# Patient Record
Sex: Female | Born: 1980 | Race: White | Hispanic: No | Marital: Married | Smoking: Never smoker
Health system: Southern US, Community
[De-identification: ages and names within clinical notes are randomized; demographics above are authoritative.]

## PROBLEM LIST (undated history)

## (undated) ENCOUNTER — Inpatient Hospital Stay: Payer: Self-pay

## (undated) DIAGNOSIS — D649 Anemia, unspecified: Secondary | ICD-10-CM

## (undated) DIAGNOSIS — F32A Depression, unspecified: Secondary | ICD-10-CM

## (undated) DIAGNOSIS — E039 Hypothyroidism, unspecified: Secondary | ICD-10-CM

## (undated) DIAGNOSIS — B009 Herpesviral infection, unspecified: Secondary | ICD-10-CM

## (undated) DIAGNOSIS — J189 Pneumonia, unspecified organism: Secondary | ICD-10-CM

## (undated) DIAGNOSIS — F419 Anxiety disorder, unspecified: Secondary | ICD-10-CM

## (undated) DIAGNOSIS — F329 Major depressive disorder, single episode, unspecified: Secondary | ICD-10-CM

## (undated) DIAGNOSIS — R87610 Atypical squamous cells of undetermined significance on cytologic smear of cervix (ASC-US): Secondary | ICD-10-CM

## (undated) DIAGNOSIS — G4730 Sleep apnea, unspecified: Secondary | ICD-10-CM

## (undated) DIAGNOSIS — R0902 Hypoxemia: Secondary | ICD-10-CM

## (undated) DIAGNOSIS — N83209 Unspecified ovarian cyst, unspecified side: Secondary | ICD-10-CM

## (undated) HISTORY — DX: Sleep apnea, unspecified: G47.30

## (undated) HISTORY — PX: WISDOM TOOTH EXTRACTION: SHX21

## (undated) HISTORY — PX: TONSILLECTOMY AND ADENOIDECTOMY: SUR1326

## (undated) HISTORY — DX: Unspecified ovarian cyst, unspecified side: N83.209

## (undated) HISTORY — DX: Atypical squamous cells of undetermined significance on cytologic smear of cervix (ASC-US): R87.610

## (undated) HISTORY — DX: Anxiety disorder, unspecified: F41.9

---

## 2004-06-25 ENCOUNTER — Emergency Department (HOSPITAL_COMMUNITY): Admission: EM | Admit: 2004-06-25 | Discharge: 2004-06-25 | Payer: Self-pay | Admitting: Emergency Medicine

## 2004-08-05 ENCOUNTER — Emergency Department (HOSPITAL_COMMUNITY): Admission: EM | Admit: 2004-08-05 | Discharge: 2004-08-05 | Payer: Self-pay | Admitting: Emergency Medicine

## 2007-04-13 ENCOUNTER — Emergency Department (HOSPITAL_COMMUNITY): Admission: EM | Admit: 2007-04-13 | Discharge: 2007-04-13 | Payer: Self-pay | Admitting: Emergency Medicine

## 2008-09-07 ENCOUNTER — Emergency Department (HOSPITAL_COMMUNITY): Admission: EM | Admit: 2008-09-07 | Discharge: 2008-09-07 | Payer: Self-pay | Admitting: Emergency Medicine

## 2010-08-06 LAB — POCT I-STAT, CHEM 8
BUN: 9 mg/dL (ref 6–23)
Calcium, Ion: 1.19 mmol/L (ref 1.12–1.32)
Hemoglobin: 14.3 g/dL (ref 12.0–15.0)
Sodium: 139 mEq/L (ref 135–145)
TCO2: 28 mmol/L (ref 0–100)

## 2010-08-06 LAB — POCT PREGNANCY, URINE: Preg Test, Ur: NEGATIVE

## 2011-06-03 ENCOUNTER — Ambulatory Visit: Payer: Self-pay | Admitting: Unknown Physician Specialty

## 2011-06-11 LAB — PATHOLOGY REPORT

## 2011-11-03 ENCOUNTER — Emergency Department: Payer: Self-pay | Admitting: *Deleted

## 2011-11-03 LAB — CBC
HCT: 40.8 % (ref 35.0–47.0)
HGB: 13.4 g/dL (ref 12.0–16.0)
MCH: 30.2 pg (ref 26.0–34.0)
MCHC: 32.8 g/dL (ref 32.0–36.0)
MCV: 92 fL (ref 80–100)
Platelet: 333 10*3/uL (ref 150–440)
RBC: 4.43 10*6/uL (ref 3.80–5.20)

## 2011-11-03 LAB — COMPREHENSIVE METABOLIC PANEL
Alkaline Phosphatase: 76 U/L (ref 50–136)
Calcium, Total: 8.8 mg/dL (ref 8.5–10.1)
Co2: 25 mmol/L (ref 21–32)
EGFR (African American): >60
EGFR (Non-African Amer.): >60
Potassium: 3.8 mmol/L (ref 3.5–5.1)
SGOT(AST): 56 U/L — ABNORMAL HIGH (ref 15–37)
SGPT (ALT): 68 U/L
Sodium: 141 mmol/L (ref 136–145)

## 2011-11-03 LAB — PREGNANCY, URINE: Pregnancy Test, Urine: NEGATIVE m[IU]/mL

## 2011-11-03 LAB — URINALYSIS, COMPLETE
Leukocyte Esterase: NEGATIVE
Nitrite: NEGATIVE
Ph: 6 (ref 4.5–8.0)
Protein: NEGATIVE
RBC,UR: NONE SEEN /HPF (ref 0–5)

## 2011-11-03 LAB — LIPASE, BLOOD: Lipase: 250 U/L (ref 73–393)

## 2012-05-09 ENCOUNTER — Emergency Department: Payer: Self-pay | Admitting: Emergency Medicine

## 2012-05-09 LAB — URINALYSIS, COMPLETE
Bilirubin,UR: NEGATIVE
Ketone: NEGATIVE
Leukocyte Esterase: NEGATIVE
Nitrite: NEGATIVE
Protein: NEGATIVE
RBC,UR: 7 /HPF (ref 0–5)

## 2012-05-09 LAB — COMPREHENSIVE METABOLIC PANEL
BUN: 9 mg/dL (ref 7–18)
Calcium, Total: 9.2 mg/dL (ref 8.5–10.1)
Chloride: 103 mmol/L (ref 98–107)
Creatinine: 0.7 mg/dL (ref 0.60–1.30)
Glucose: 105 mg/dL — ABNORMAL HIGH (ref 65–99)
Osmolality: 271 (ref 275–301)
SGOT(AST): 35 U/L (ref 15–37)

## 2012-05-09 LAB — CBC: HGB: 14.3 g/dL (ref 12.0–16.0)

## 2012-05-09 LAB — LIPASE, BLOOD: Lipase: 176 U/L (ref 73–393)

## 2012-07-03 ENCOUNTER — Emergency Department: Payer: Self-pay | Admitting: Emergency Medicine

## 2014-08-20 NOTE — Op Note (Signed)
PATIENT NAME:  Jenny Hendrix, Jenny Hendrix MR#:  [id number] DATE OF BIRTH:  [birth year]  DATE OF PROCEDURE:  06/03/2011  PREOPERATIVE DIAGNOSES:  1. Chronic adenotonsillitis. 2. Obstructive sleep apnea.   POSTOPERATIVE DIAGNOSES:  1. Chronic adenotonsillitis. 2. Obstructive sleep apnea.   PROCEDURE PERFORMED: Tonsillectomy and adenoidectomy.   SURGEON: Roena Malady, MD  ANESTHESIA: General endotracheal.   DESCRIPTION OF THE PROCEDURE:  [name] was identified in the holding area and taken to the operating room and placed in the supine position.  After general endotracheal anesthesia, the table was turned 45 degrees and the patient was draped in the usual fashion for a tonsillectomy.  A mouth gag was inserted into the oral cavity and examination of the oropharynx showed the uvula was non-bifid.  There was no evidence of submucous cleft to the palate.  There were large tonsils.  A red rubber catheter was placed through the nostril.  Examination of the nasopharynx showed large obstructing adenoids.  Under indirect vision with the mirror, an adenotome was placed in the nasopharynx.  The adenoids were curetted free.  Reinspection with a mirror showed excellent removal of the adenoid.  Nasopharyngeal packs were then placed.  The operation then turned to the tonsillectomy.  Beginning on the left-hand side a tenaculum was used to grasp the tonsil and the Bovie cautery was used to dissect it free from the fossa.  In a similar fashion, the right tonsil was removed.  Meticulous hemostasis was achieved using the Bovie cautery.  With both tonsils removed and no active bleeding, the nasopharyngeal packs were removed.  Suction cautery was then used to cauterize the nasopharyngeal bed to prevent bleeding.  The red rubber catheter was removed with no active bleeding.  0.5% plain Marcaine was used to inject the anterior and posterior tonsillar pillars bilaterally.  A total of 8 mL was used.  The patient tolerated the  procedure well and was awakened in the operating room and taken to the recovery room in stable condition.   CULTURES:  None.  SPECIMENS:  Tonsils and adenoids.  ESTIMATED BLOOD LOSS:  Less than 30 mL.   ____________________________ Roena Malady, MD ctm:cms D: 06/03/2011 09:33:24 ET T: 06/03/2011 10:16:54 ET JOB#: [id number]  cc: Roena Malady, MD, <Dictator> Roena Malady MD ELECTRONICALLY SIGNED 06/10/2011 12:46

## 2014-08-21 ENCOUNTER — Emergency Department
Admit: 2014-08-21 | Disposition: A | Discharge status: Home / Self Care | Payer: Self-pay | Admitting: Emergency Medicine

## 2014-08-21 LAB — COMPREHENSIVE METABOLIC PANEL
ANION GAP: 9 (ref 7–16)
Albumin: 4.5 g/dL
Alkaline Phosphatase: 48 U/L
BILIRUBIN TOTAL: 0.6 mg/dL
BUN: 14 mg/dL
CALCIUM: 9.5 mg/dL
CO2: 27 mmol/L
Chloride: 101 mmol/L
Creatinine: 0.84 mg/dL
EGFR (Non-African Amer.): >60
GLUCOSE: 111 mg/dL — AB
Potassium: 3.5 mmol/L
SGOT(AST): 30 U/L
SGPT (ALT): 21 U/L
Sodium: 137 mmol/L
Total Protein: 8.3 g/dL — ABNORMAL HIGH

## 2014-08-21 LAB — CBC
HCT: 41 % (ref 35.0–47.0)
HGB: 13.6 g/dL (ref 12.0–16.0)
MCH: 28.9 pg (ref 26.0–34.0)
MCHC: 33.2 g/dL (ref 32.0–36.0)
MCV: 87 fL (ref 80–100)
Platelet: 348 10*3/uL (ref 150–440)
RBC: 4.72 10*6/uL (ref 3.80–5.20)
RDW: 12.8 % (ref 11.5–14.5)
WBC: 7.8 10*3/uL (ref 3.6–11.0)

## 2014-08-21 LAB — TROPONIN I: Troponin-I: <0.03 ng/mL

## 2014-12-01 ENCOUNTER — Other Ambulatory Visit: Payer: Self-pay | Admitting: Obstetrics and Gynecology

## 2014-12-01 DIAGNOSIS — N979 Female infertility, unspecified: Secondary | ICD-10-CM

## 2014-12-06 ENCOUNTER — Ambulatory Visit: Payer: Self-pay

## 2015-01-04 ENCOUNTER — Encounter: Payer: Self-pay | Admitting: Obstetrics and Gynecology

## 2015-01-04 ENCOUNTER — Ambulatory Visit
Admission: RE | Admit: 2015-01-04 | Discharge: 2015-01-04 | Disposition: A | Discharge status: Home / Self Care | Payer: BLUE CROSS/BLUE SHIELD | Source: Ambulatory Visit | Attending: Obstetrics and Gynecology | Admitting: Obstetrics and Gynecology

## 2015-01-04 DIAGNOSIS — N979 Female infertility, unspecified: Secondary | ICD-10-CM | POA: Insufficient documentation

## 2015-01-04 NOTE — Progress Notes (Signed)
Patient ID: Jenny Hendrix, female   DOB: 22-Dec-1980, 34 y.o.   MRN: [id number] @PATIENTID @[id number]   1982/01/32 y.o. 01/04/2015 Benjaman Kindler, MD  Hysterosalpingogram Procedure Note  Date of procedure: 01/04/2015   Pre-operative Diagnosis: Infertility  Post-operative Diagnosis: same  Procedure: Hysterosalpingogram  Surgeon: Angelina Pih, MD  Assistant(s):  Radiology assistant. The radiologist present for today read the imaging and agreed with findings below.  Anesthesia: None  Estimated Blood Loss:  None         Complications:  None; patient tolerated the procedure well.         Disposition: To home         Condition: stable  Findings: Bilateral fill and spill of the tubes and a normal endometrial contour was noted.  Procedure Details  HSG procedure discussed with the patient.  Risks, complications, alternatives have been reviewed with her and she agrees to proceed.   The patient presented to the radiology lab and was identified as the correct patient and the procedure verified as an HSG. A verbal Time Out was held with all team members present and in agreement.  Speculum was inserted in to the vagina and the cervix visualized.  The cervix was cleaned with betadine solution. The HSG catheter was inserted and the balloon insufflated with approximately 1.5 ml of air.  Patient was then repositioned for fluoroscopy.  A total of 6 ml of contrast was used for the procedure. The patient tolerated the procedure well, no complications.   Bilateral fill and spill of the tubes and a normal endometrial contour was noted.  Results were reviewed with the patient at the time of the procedure. She verbalized understanding.   Benjaman Kindler, MD 01/04/2015

## 2015-02-16 ENCOUNTER — Ambulatory Visit
Admission: RE | Admit: 2015-02-16 | Payer: BLUE CROSS/BLUE SHIELD | Source: Ambulatory Visit | Admitting: Gastroenterology

## 2015-02-16 ENCOUNTER — Encounter: Admission: RE | Payer: Self-pay | Source: Ambulatory Visit

## 2015-02-16 SURGERY — ESOPHAGOGASTRODUODENOSCOPY (EGD) WITH PROPOFOL
Anesthesia: General

## 2016-05-22 DIAGNOSIS — R87610 Atypical squamous cells of undetermined significance on cytologic smear of cervix (ASC-US): Secondary | ICD-10-CM

## 2016-05-22 HISTORY — DX: Atypical squamous cells of undetermined significance on cytologic smear of cervix (ASC-US): R87.610

## 2016-05-22 LAB — OB RESULTS CONSOLE HGB/HCT, BLOOD
HEMATOCRIT: 40 %
HEMOGLOBIN: 13.2 g/dL

## 2016-05-22 LAB — OB RESULTS CONSOLE HIV ANTIBODY (ROUTINE TESTING): HIV: NONREACTIVE

## 2016-05-22 LAB — OB RESULTS CONSOLE RUBELLA ANTIBODY, IGM: RUBELLA: IMMUNE

## 2016-05-22 LAB — OB RESULTS CONSOLE GC/CHLAMYDIA
Chlamydia: NEGATIVE
GC PROBE AMP, GENITAL: NEGATIVE

## 2016-05-22 LAB — OB RESULTS CONSOLE HEPATITIS B SURFACE ANTIGEN: HEP B S AG: NEGATIVE

## 2016-05-22 LAB — OB RESULTS CONSOLE PLATELET COUNT: PLATELETS: 355 10*3/uL

## 2016-05-22 LAB — OB RESULTS CONSOLE VARICELLA ZOSTER ANTIBODY, IGG: Varicella: IMMUNE

## 2016-05-22 LAB — OB RESULTS CONSOLE RPR: RPR: NONREACTIVE

## 2016-05-22 LAB — OB RESULTS CONSOLE ANTIBODY SCREEN: ANTIBODY SCREEN: NEGATIVE

## 2016-06-19 ENCOUNTER — Emergency Department: Payer: BLUE CROSS/BLUE SHIELD

## 2016-06-19 ENCOUNTER — Encounter
Admission: EM | Disposition: A | Discharge status: Home / Self Care | Payer: Self-pay | Source: Home / Self Care | Attending: Emergency Medicine

## 2016-06-19 ENCOUNTER — Emergency Department
Admission: EM | Admit: 2016-06-19 | Discharge: 2016-06-19 | Disposition: A | Discharge status: Home / Self Care | Payer: BLUE CROSS/BLUE SHIELD | Attending: Emergency Medicine | Admitting: Emergency Medicine

## 2016-06-19 ENCOUNTER — Emergency Department: Payer: BLUE CROSS/BLUE SHIELD | Admitting: Registered Nurse

## 2016-06-19 DIAGNOSIS — O034 Incomplete spontaneous abortion without complication: Secondary | ICD-10-CM | POA: Diagnosis not present

## 2016-06-19 DIAGNOSIS — O039 Complete or unspecified spontaneous abortion without complication: Secondary | ICD-10-CM

## 2016-06-19 DIAGNOSIS — O4691 Antepartum hemorrhage, unspecified, first trimester: Secondary | ICD-10-CM | POA: Diagnosis present

## 2016-06-19 HISTORY — PX: DILATION AND EVACUATION: SHX1459

## 2016-06-19 LAB — CBC WITH DIFFERENTIAL/PLATELET
BASOS ABS: 0 10*3/uL (ref 0–0.1)
BASOS PCT: 0 %
EOS PCT: 4 %
Eosinophils Absolute: 0.3 10*3/uL (ref 0–0.7)
HEMATOCRIT: 38.1 % (ref 35.0–47.0)
Hemoglobin: 13.2 g/dL (ref 12.0–16.0)
LYMPHS PCT: 21 %
Lymphs Abs: 1.7 10*3/uL (ref 1.0–3.6)
MCH: 29.7 pg (ref 26.0–34.0)
MCHC: 34.6 g/dL (ref 32.0–36.0)
MCV: 85.9 fL (ref 80.0–100.0)
Monocytes Absolute: 0.7 10*3/uL (ref 0.2–0.9)
Monocytes Relative: 8 %
NEUTROS ABS: 5.4 10*3/uL (ref 1.4–6.5)
Neutrophils Relative %: 67 %
PLATELETS: 342 10*3/uL (ref 150–440)
RBC: 4.43 MIL/uL (ref 3.80–5.20)
RDW: 13.1 % (ref 11.5–14.5)
WBC: 8.1 10*3/uL (ref 3.6–11.0)

## 2016-06-19 LAB — HCG, QUANTITATIVE, PREGNANCY: hCG, Beta Chain, Quant, S: 5975 m[IU]/mL — ABNORMAL HIGH (ref <5)

## 2016-06-19 LAB — ABO/RH: ABO/RH(D): A POS

## 2016-06-19 SURGERY — DILATION AND EVACUATION, UTERUS
Anesthesia: General

## 2016-06-19 MED ORDER — KETOROLAC TROMETHAMINE 30 MG/ML IJ SOLN: 30.0000 mg | Freq: Four times a day (QID) | INTRAMUSCULAR | Status: DC

## 2016-06-19 MED ORDER — PROPOFOL 10 MG/ML IV BOLUS
INTRAVENOUS | Status: AC
  Filled 2016-06-19: qty 20

## 2016-06-19 MED ORDER — GLYCOPYRROLATE 0.2 MG/ML IJ SOLN
INTRAMUSCULAR | Status: DC | PRN
  Administered 2016-06-19: 0.2 mg via INTRAVENOUS

## 2016-06-19 MED ORDER — METHYLERGONOVINE MALEATE 0.2 MG PO TABS: 0.2000 mg | ORAL_TABLET | Freq: Four times a day (QID) | ORAL | 0 refills | Status: DC

## 2016-06-19 MED ORDER — ACETAMINOPHEN 325 MG PO TABS: 650.0000 mg | ORAL_TABLET | ORAL | Status: DC | PRN

## 2016-06-19 MED ORDER — MIDAZOLAM HCL 2 MG/2ML IJ SOLN
INTRAMUSCULAR | Status: DC | PRN
  Administered 2016-06-19: 2 mg via INTRAVENOUS

## 2016-06-19 MED ORDER — SODIUM CHLORIDE 0.9 % IV BOLUS (SEPSIS)
1000.0000 mL | Freq: Once | INTRAVENOUS | Status: AC
  Administered 2016-06-19: 1000 mL via INTRAVENOUS

## 2016-06-19 MED ORDER — MORPHINE SULFATE (PF) 4 MG/ML IV SOLN: 1.0000 mg | INTRAVENOUS | Status: DC | PRN

## 2016-06-19 MED ORDER — ONDANSETRON HCL 4 MG/2ML IJ SOLN
INTRAMUSCULAR | Status: AC
  Filled 2016-06-19: qty 2

## 2016-06-19 MED ORDER — ACETAMINOPHEN 500 MG PO TABS
1000.0000 mg | ORAL_TABLET | Freq: Once | ORAL | Status: AC
Start: 2016-06-19 — End: 2016-06-19
  Administered 2016-06-19: 1000 mg via ORAL
  Filled 2016-06-19: qty 2

## 2016-06-19 MED ORDER — DOXYCYCLINE HYCLATE 100 MG PO CAPS: 100.0000 mg | ORAL_CAPSULE | Freq: Two times a day (BID) | ORAL | 0 refills | Status: DC

## 2016-06-19 MED ORDER — ACETAMINOPHEN NICU IV SYRINGE 10 MG/ML
INTRAVENOUS | Status: AC
  Filled 2016-06-19: qty 1

## 2016-06-19 MED ORDER — IBUPROFEN 400 MG PO TABS: 400.0000 mg | ORAL_TABLET | Freq: Four times a day (QID) | ORAL | 0 refills | Status: DC | PRN

## 2016-06-19 MED ORDER — LACTATED RINGERS IV SOLN: INTRAVENOUS | Status: DC

## 2016-06-19 MED ORDER — PROMETHAZINE HCL 25 MG/ML IJ SOLN
6.2500 mg | INTRAMUSCULAR | Status: DC | PRN
  Administered 2016-06-19: 6.25 mg via INTRAVENOUS

## 2016-06-19 MED ORDER — FENTANYL CITRATE (PF) 100 MCG/2ML IJ SOLN
25.0000 ug | INTRAMUSCULAR | Status: DC | PRN
  Administered 2016-06-19 (×3): 25 ug via INTRAVENOUS

## 2016-06-19 MED ORDER — SODIUM CHLORIDE 0.9 % IJ SOLN
INTRAMUSCULAR | Status: AC
  Filled 2016-06-19: qty 10

## 2016-06-19 MED ORDER — IBUPROFEN 400 MG PO TABS
400.0000 mg | ORAL_TABLET | Freq: Four times a day (QID) | ORAL | Status: DC | PRN
  Administered 2016-06-19: 400 mg via ORAL
  Filled 2016-06-19 (×2): qty 1

## 2016-06-19 MED ORDER — DEXAMETHASONE SODIUM PHOSPHATE 10 MG/ML IJ SOLN
INTRAMUSCULAR | Status: AC
  Filled 2016-06-19: qty 1

## 2016-06-19 MED ORDER — FENTANYL CITRATE (PF) 100 MCG/2ML IJ SOLN
INTRAMUSCULAR | Status: DC | PRN
  Administered 2016-06-19: 25 ug via INTRAVENOUS
  Administered 2016-06-19: 75 ug via INTRAVENOUS

## 2016-06-19 MED ORDER — MIDAZOLAM HCL 2 MG/2ML IJ SOLN
INTRAMUSCULAR | Status: AC
  Filled 2016-06-19: qty 2

## 2016-06-19 MED ORDER — PROMETHAZINE HCL 25 MG/ML IJ SOLN
INTRAMUSCULAR | Status: AC
  Filled 2016-06-19: qty 1

## 2016-06-19 MED ORDER — ACETAMINOPHEN 10 MG/ML IV SOLN
INTRAVENOUS | Status: DC | PRN
  Administered 2016-06-19: 1000 mg via INTRAVENOUS

## 2016-06-19 MED ORDER — PROPOFOL 10 MG/ML IV BOLUS
INTRAVENOUS | Status: DC | PRN
  Administered 2016-06-19 (×2): 200 mg via INTRAVENOUS

## 2016-06-19 MED ORDER — ACETAMINOPHEN 650 MG RE SUPP: 650.0000 mg | RECTAL | Status: DC | PRN

## 2016-06-19 MED ORDER — LACTATED RINGERS IV SOLN
INTRAVENOUS | Status: DC | PRN
  Administered 2016-06-19: 13:00:00 via INTRAVENOUS

## 2016-06-19 MED ORDER — FENTANYL CITRATE (PF) 100 MCG/2ML IJ SOLN
INTRAMUSCULAR | Status: AC
  Administered 2016-06-19: 25 ug via INTRAVENOUS
  Filled 2016-06-19: qty 2

## 2016-06-19 MED ORDER — DEXAMETHASONE SODIUM PHOSPHATE 10 MG/ML IJ SOLN
INTRAMUSCULAR | Status: DC | PRN
  Administered 2016-06-19: 5 mg via INTRAVENOUS

## 2016-06-19 MED ORDER — LIDOCAINE HCL (CARDIAC) 20 MG/ML IV SOLN
INTRAVENOUS | Status: DC | PRN
  Administered 2016-06-19: 80 mg via INTRAVENOUS

## 2016-06-19 MED ORDER — FENTANYL CITRATE (PF) 100 MCG/2ML IJ SOLN
INTRAMUSCULAR | Status: AC
  Filled 2016-06-19: qty 2

## 2016-06-19 SURGICAL SUPPLY — 23 items
BAG COUNTER SPONGE EZ (MISCELLANEOUS) ×2 IMPLANT
BAG SPNG 4X4 CLR HAZ (MISCELLANEOUS) ×1
CANISTER SUC SOCK COL 7IN (MISCELLANEOUS) ×3 IMPLANT
CATH ROBINSON RED A/P 16FR (CATHETERS) ×3 IMPLANT
COUNTER SPONGE BAG EZ (MISCELLANEOUS) ×1
FILTER UTR ASPR SPEC (MISCELLANEOUS) ×1 IMPLANT
FLTR UTR ASPR SPEC (MISCELLANEOUS) ×3
GLOVE BIO SURGEON STRL SZ8 (GLOVE) ×3 IMPLANT
GOWN STRL REUS W/ TWL LRG LVL3 (GOWN DISPOSABLE) ×1 IMPLANT
GOWN STRL REUS W/ TWL XL LVL3 (GOWN DISPOSABLE) ×1 IMPLANT
GOWN STRL REUS W/TWL LRG LVL3 (GOWN DISPOSABLE) ×3
GOWN STRL REUS W/TWL XL LVL3 (GOWN DISPOSABLE) ×3
KIT BERKELEY 1ST TRIMESTER 3/8 (MISCELLANEOUS) ×3 IMPLANT
KIT RM TURNOVER CYSTO AR (KITS) ×3 IMPLANT
NS IRRIG 500ML POUR BTL (IV SOLUTION) ×3 IMPLANT
PACK DNC HYST (MISCELLANEOUS) ×3 IMPLANT
PAD OB MATERNITY 4.3X12.25 (PERSONAL CARE ITEMS) ×3 IMPLANT
PAD PREP 24X41 OB/GYN DISP (PERSONAL CARE ITEMS) ×3 IMPLANT
SET BERKELEY SUCTION TUBING (SUCTIONS) ×3 IMPLANT
TOWEL OR 17X26 4PK STRL BLUE (TOWEL DISPOSABLE) ×3 IMPLANT
VACURETTE 10 RIGID CVD (CANNULA) ×1 IMPLANT
VACURETTE 12 RIGID CVD (CANNULA) ×1 IMPLANT
VACURETTE 8 RIGID CVD (CANNULA) ×3 IMPLANT

## 2016-06-19 NOTE — ED Provider Notes (Signed)
Time Seen: Approximately 0834  I have reviewed the triage notes  Chief Complaint: Vaginal Bleeding   History of Present Illness: Jenny Hendrix is a 36 y.o. female *who is gravida 1 para 0 approximately [redacted] weeks pregnant. She started having some spotty vaginal bleeding yesterday which has now become much heavier with passage of large clots. The patient's had some lower middle quadrant abdominal pain and some mild nausea. She denies any feelings of lightheadedness or shortness of breath. She was referred here by her physician for further evaluation. She states she had a normal ultrasound at 6 weeks of pregnancy with no evidence of an ectopic pregnancy.   No past medical history on file.  There are no active problems to display for this patient.   No past surgical history on file.  No past surgical history on file.    Allergies:  Patient has no known allergies.  Family History: No family history on file.  Social History: Social History  Substance Use Topics  . Smoking status: Never Smoker  . Smokeless tobacco: Never Used  . Alcohol use No     Review of Systems:   10 point review of systems was performed and was otherwise negative:  Constitutional: No fever Eyes: No visual disturbances ENT: No sore throat, ear pain Cardiac: No chest pain Respiratory: No shortness of breath, wheezing, or stridor Abdomen: No abdominal pain, no vomiting, No diarrhea Endocrine: No weight loss, No night sweats Extremities: No peripheral edema, cyanosis Skin: No rashes, easy bruising Neurologic: No focal weakness, trouble with speech or swollowing Urologic: No dysuria, Hematuria, or urinary frequency Vaginal bleeding initially a small spotty vaginal bleeding without passage of large clots and has gone 3-4 pads while here in the emergency department  Physical Exam:  ED Triage Vitals  Enc Vitals Group     BP 06/19/16 0820 (!) 129/95     Pulse Rate 06/19/16 0820 78     Resp  06/19/16 0820 19     Temp 06/19/16 0820 97.8 F (36.6 C)     Temp Source 06/19/16 0820 Oral     SpO2 06/19/16 0820 98 %     Weight 06/19/16 0821 201 lb (91.2 kg)     Height 06/19/16 0821 5\' 3"  (1.6 m)     Head Circumference --      Peak Flow --      Pain Score 06/19/16 0821 5     Pain Loc --      Pain Edu? --      Excl. in [place]? --     General: Awake , Alert , and Oriented times 3; GCS 15 Head: Normal cephalic , atraumatic Eyes: Pupils equal , round, reactive to light Nose/Throat: No nasal drainage, patent upper airway without erythema or exudate.  Neck: Supple, Full range of motion, No anterior adenopathy or palpable thyroid masses Lungs: Clear to ascultation without wheezes , rhonchi, or rales Heart: Regular rate, regular rhythm without murmurs , gallops , or rubs Abdomen: Soft, non tender without rebound, guarding , or rigidity; bowel sounds positive and symmetric in all 4 quadrants. No organomegaly .        Extremities: 2 plus symmetric pulses. No edema, clubbing or cyanosis Neurologic: normal ambulation, Motor symmetric without deficits, sensory intact Skin: warm, dry, no rashes  Pelvic exam deferred for ultrasound evaluation Labs:   All laboratory work was reviewed including any pertinent negatives or positives listed below:  Labs Reviewed  CBC WITH DIFFERENTIAL/PLATELET  HCG, QUANTITATIVE,  PREGNANCY  ABO/RH     Radiology: "US Ob Comp Less 14 Wks  Result Date: 06/19/2016 CLINICAL DATA:  First-trimester pregnancy. Vaginal bleeding for 2 days. Quantitative beta HCG 5,975. EXAM: OBSTETRIC <14 WK [place] AND TRANSVAGINAL OB US TECHNIQUE: Both transabdominal and transvaginal ultrasound examinations were performed for complete evaluation of the gestation as well as the maternal uterus, adnexal regions, and pelvic cul-de-sac. Transvaginal technique was performed to assess early pregnancy. COMPARISON:  None for this pregnancy. FINDINGS: Intrauterine gestational sac: Not present Yolk  sac:  Not present Embryo:  Not present Cardiac Activity: Not present Maternal uterus/adnexae: The endometrium is thickened and heterogeneous, measured up to 0.7 cm. There is increased vascular flow within the endometrium. Material within the endometrial cavity is seen into the lower uterine segment and cervix. The right ovary is within normal limits. A benign appearing cyst in the left ovary measures 2.3 x 2.2 x 2.3 cm. There is no yolk sac or embryo associated with this cyst. IMPRESSION: 1. Lab and heterogeneous endometrial cavity with increased color Doppler flow suggesting spontaneous abortion in progress. 2. 2.3 cm cyst in the left ovary. This likely reflects a corpus luteal cyst. There is no evidence for ectopic pregnancy. Electronically Signed   By: San Morelle M.D.   On: 06/19/2016 11:01   US Ob Transvaginal  Result Date: 06/19/2016 CLINICAL DATA:  First-trimester pregnancy. Vaginal bleeding for 2 days. Quantitative beta HCG 5,975. EXAM: OBSTETRIC <14 WK [place] AND TRANSVAGINAL OB US TECHNIQUE: Both transabdominal and transvaginal ultrasound examinations were performed for complete evaluation of the gestation as well as the maternal uterus, adnexal regions, and pelvic cul-de-sac. Transvaginal technique was performed to assess early pregnancy. COMPARISON:  None for this pregnancy. FINDINGS: Intrauterine gestational sac: Not present Yolk sac:  Not present Embryo:  Not present Cardiac Activity: Not present Maternal uterus/adnexae: The endometrium is thickened and heterogeneous, measured up to 0.7 cm. There is increased vascular flow within the endometrium. Material within the endometrial cavity is seen into the lower uterine segment and cervix. The right ovary is within normal limits. A benign appearing cyst in the left ovary measures 2.3 x 2.2 x 2.3 cm. There is no yolk sac or embryo associated with this cyst. IMPRESSION: 1. Lab and heterogeneous endometrial cavity with increased color Doppler flow  suggesting spontaneous abortion in progress. 2. 2.3 cm cyst in the left ovary. This likely reflects a corpus luteal cyst. There is no evidence for ectopic pregnancy. Electronically Signed   By: San Morelle M.D.   On: 06/19/2016 11:01  "  I personally reviewed the radiologic studies   ED Course:  The patient still has persistent bleeding is been through multiple pads and checks here in emergency department. She does remain hemodynamically stable and ultrasound shows what may be some retained products of a spontaneous abortion. The patient's case was reviewed with Dr. Kenton Kingfisher who was on call for [facility] side OB/GYN. He plans on taking the patient to the operating room for a D&C.     Assessment: Spontaneous abortion Associated heavy vaginal bleeding     Plan:  Inpatient            Daymon Larsen, MD 06/19/16 1426

## 2016-06-19 NOTE — Anesthesia Post-op Follow-up Note (Cosign Needed)
Anesthesia QCDR form completed.        

## 2016-06-19 NOTE — H&P (Signed)
Obstetrics & Gynecology History and Physical Note  Date of Consultation: 06/19/2016   Requesting Provider: [facility] ER  Primary OBGYN: [facility] Primary Care Provider: Marinda Elk  Reason for Consultation: Bleeding first trimester  History of Present Illness: Jenny Hendrix is a 36 y.o. G1P0 (Patient's last menstrual period was 03/26/2016.), with the above CC. She has had PNC at [facility], [place] 4 weeks ago w FHTs and [place].  Now has 2 day h/o bleeding that is escalating, mild cramping (no radiation, modifiers, context, or assoc sx's).    ROS: A review of systems was performed and negative, except as stated in the above HPI.  OBGYN History: As per HPI. OB History    Gravida Para Term Preterm AB Living   1             SAB TAB Ectopic Multiple Live Births                   Past Medical History: No past medical history on file.  Past Surgical History: No past surgical history on file.  Family History:  No family history on file. She denies any female cancers, bleeding or blood clotting disorders.   Social History:  Social History   Social History  . Marital status: Divorced    Spouse name: N/A  . Number of children: N/A  . Years of education: N/A   Occupational History  . Not on file.   Social History Main Topics  . Smoking status: Never Smoker  . Smokeless tobacco: Never Used  . Alcohol use No  . Drug use: Unknown  . Sexual activity: Not on file   Other Topics Concern  . Not on file   Social History Narrative  . No narrative on file    Allergy: Allergies  Allergen Reactions  . Metronidazole Other (See Comments)    Body aches, all side effects     Current Outpatient Medications:  (Not in a hospital admission)  Hospital Medications: No current facility-administered medications for this encounter.    Current Outpatient Prescriptions  Medication Sig Dispense Refill  . citalopram (CELEXA) 20 MG tablet Take 20 mg by mouth daily.    Marland Kitchen LORazepam (ATIVAN)  2 MG tablet Take 2 mg by mouth 3 (three) times daily as needed.  2  . Prenatal Vit-Fe Fumarate-FA (MULTIVITAMIN-PRENATAL) 27-0.8 MG TABS tablet Take 1 tablet by mouth daily at 12 noon.      Physical Exam: Vitals:   06/19/16 0820 06/19/16 0821 06/19/16 1000  BP: (!) 129/95  125/87  Pulse: 78  67  Resp: 19  13  Temp: 97.8 F (36.6 C)    TempSrc: Oral    SpO2: 98%  95%  Weight:  201 lb (91.2 kg)   Height:  5\' 3"  (1.6 m)     Temp:  [97.8 F (36.6 C)] 97.8 F (36.6 C) (02/22 0820) Pulse Rate:  [67-78] 67 (02/22 1000) Resp:  [13-19] 13 (02/22 1000) BP: (125-129)/(87-95) 125/87 (02/22 1000) SpO2:  [95 %-98 %] 95 % (02/22 1000) Weight:  [201 lb (91.2 kg)] 201 lb (91.2 kg) (02/22 0821) No intake/output data recorded. No intake/output data recorded. No intake or output data in the 24 hours ending 06/19/16 1153  Body mass index is 35.61 kg/m. Constitutional: Well nourished, well developed female in no acute distress.  HEENT: normal Neck:  Supple, normal appearance, and no thyromegaly  Cardiovascular:Regular rate and rhythm.  No murmurs, rubs or gallops. Respiratory:  Clear to auscultation bilateral. Normal respiratory  effort Abdomen: positive bowel sounds and no masses, hernias; diffusely non tender to palpation, non distended Neuro: grossly intact Psych:  Normal mood and affect.  Skin:  Warm and dry.  MS: normal gait and normal bilateral lower extremity strength/ROM/symmetry Lymphatic:  No inguinal lymphadenopathy.   Pelvic exam: is not limited by body habitus EGBUS: within normal limits Vagina: within normal limits. Bladder and Urethra: normal. Cervix: blood and clot thru os, slightly dilated, tender Uterus:  enlarged, 10 weeks size Adnexa: no mass, fullness, tenderness  Recent Labs Lab 06/19/16 0834  WBC 8.1  HGB 13.2  HCT 38.1  PLT 342  A+  Imaging:  Ultrasound independently reviewed/interpreted by self.  CLINICAL DATA:  First-trimester pregnancy. Vaginal  bleeding for 2 days. Quantitative beta HCG 5,975.  EXAM: OBSTETRIC <14 WK [place] AND TRANSVAGINAL OB US  TECHNIQUE: Both transabdominal and transvaginal ultrasound examinations were performed for complete evaluation of the gestation as well as the maternal uterus, adnexal regions, and pelvic cul-de-sac. Transvaginal technique was performed to assess early pregnancy.  COMPARISON:  None for this pregnancy.  FINDINGS: Intrauterine gestational sac: Not present  Yolk sac:  Not present  Embryo:  Not present  Cardiac Activity: Not present  Maternal uterus/adnexae: The endometrium is thickened and heterogeneous, measured up to 0.7 cm. There is increased vascular flow within the endometrium. Material within the endometrial cavity is seen into the lower uterine segment and cervix.  The right ovary is within normal limits. A benign appearing cyst in the left ovary measures 2.3 x 2.2 x 2.3 cm. There is no yolk sac or embryo associated with this cyst.  IMPRESSION: 1. Lab and heterogeneous endometrial cavity with increased color Doppler flow suggesting spontaneous abortion in progress. 2. 2.3 cm cyst in the left ovary. This likely reflects a corpus luteal cyst. There is no evidence for ectopic pregnancy.  Assessment: Jenny Hendrix is a 36 y.o. G1P0 (Patient's last menstrual period was 03/26/2016.) who presented to the ED with complaints of BLEEDING; findings are consistent with FIRST TRIMESTER BLEEDING and INCOMPLETE ABORTION.  Plan: Options discussed Due to bleeding, D&C seems best option (over meds, exp mgt). Pros and cons discussed, consent. Plan to go home later w Doxy, Methergine, and analgesics.  Barnett Applebaum, MD [facility] OBGYN Pager [phone]

## 2016-06-19 NOTE — Discharge Instructions (Signed)
Dilation and Curettage or Vacuum Curettage, Care After °This sheet gives you information about how to care for yourself after your procedure. Your health care provider may also give you more specific instructions. If you have problems or questions, contact your health care provider. °What can I expect after the procedure? °After your procedure, it is common to have: °· Mild pain or cramping. °· Some vaginal bleeding or spotting. °These may last for up to 2 weeks after your procedure. °Follow these instructions at home: °Activity  ° °· Do not drive or use heavy machinery while taking prescription pain medicine. °· Avoid driving for the first 24 hours after your procedure. °· Take frequent, short walks, followed by rest periods, throughout the day. Ask your health care provider what activities are safe for you. After 1-2 days, you may be able to return to your normal activities. °· Do not lift anything heavier than 10 lb (4.5 kg) until your health care provider approves. °· For at least 2 weeks, or as long as told by your health care provider, do not: °¨ Douche. °¨ Use tampons. °¨ Have sexual intercourse. °General instructions  ° °· Take over-the-counter and prescription medicines only as told by your health care provider. This is especially important if you take blood thinning medicine. °· Do not take baths, swim, or use a hot tub until your health care provider approves. Take showers instead of baths. °· Wear compression stockings as told by your health care provider. These stockings help to prevent blood clots and reduce swelling in your legs. °· It is your responsibility to get the results of your procedure. Ask your health care provider, or the department performing the procedure, when your results will be ready. °· Keep all follow-up visits as told by your health care provider. This is important. °Contact a health care provider if: °· You have severe cramps that get worse or that do not get better with  medicine. °· You have severe abdominal pain. °· You cannot drink fluids without vomiting. °· You develop pain in a different area of your pelvis. °· You have bad-smelling vaginal discharge. °· You have a rash. °Get help right away if: °· You have vaginal bleeding that soaks more than one sanitary pad in 1 hour, for 2 hours in a row. °· You pass large blood clots from your vagina. °· You have a fever that is above 100.4°F (38.0°C). °· Your abdomen feels very tender or hard. °· You have chest pain. °· You have shortness of breath. °· You cough up blood. °· You feel dizzy or light-headed. °· You faint. °· You have pain in your neck or shoulder area. °This information is not intended to replace advice given to you by your health care provider. Make sure you discuss any questions you have with your health care provider. °Document Released: 04/11/2000 Document Revised: 12/12/2015 Document Reviewed: 11/15/2015 °Elsevier Interactive Patient Education © 2017 [employer] ° °

## 2016-06-19 NOTE — Progress Notes (Signed)
[address] [place] [id number] [place] [address] Phone: [phone]  June 19, 2016  Patient: Jenny Hendrix  Date of Birth: [birth year]  Date of Visit: 06/19/2016    To Whom It May Concern:  Sheryll Colao (wife to Aaron Edelman) was seen and treated in our Labor and [facility] on 06/19/2016. QUILLA KEICHER  may return to work on 06/20/16.  Sincerely,  Barnett Applebaum, MD [facility] Ob/Gyn

## 2016-06-19 NOTE — Transfer of Care (Signed)
Immediate Anesthesia Transfer of Care Note  Patient: Jenny Hendrix  Procedure(s) Performed: Procedure(s): DILATATION AND EVACUATION (N/A)  Patient Location: PACU  Anesthesia Type:General  Level of Consciousness: sedated  Airway & Oxygen Therapy: Patient Spontanous Breathing and Patient connected to face mask oxygen  Post-op Assessment: Report given to RN and Post -op Vital signs reviewed and stable  Post vital signs: Reviewed and stable  Last Vitals:  Vitals:   06/19/16 1130 06/19/16 1322  BP: (!) 127/94 116/76  Pulse: 63 69  Resp: 14 11  Temp:  0000000 C    Complications: No apparent anesthesia complications

## 2016-06-19 NOTE — Anesthesia Preprocedure Evaluation (Signed)
Anesthesia Evaluation  Patient identified by MRN, date of birth, ID band Patient awake    Reviewed: Allergy & Precautions, H&P , NPO status , Patient's Chart, lab work & pertinent test results, reviewed documented beta blocker date and time   History of Anesthesia Complications Negative for: history of anesthetic complications  Airway Mallampati: III  TM Distance: >3 FB Neck ROM: full    Dental  (+) Teeth Intact   Pulmonary neg pulmonary ROS,           Cardiovascular Exercise Tolerance: Good negative cardio ROS       Neuro/Psych PSYCHIATRIC DISORDERS (Depression and anxiety) negative neurological ROS     GI/Hepatic Neg liver ROS, GERD  ,  Endo/Other  negative endocrine ROS  Renal/GU negative Renal ROS  negative genitourinary   Musculoskeletal   Abdominal   Peds  Hematology negative hematology ROS (+)   Anesthesia Other Findings History reviewed. No pertinent past medical history.   Reproductive/Obstetrics (+) Pregnancy                             Anesthesia Physical Anesthesia Plan  ASA: III  Anesthesia Plan: General   Post-op Pain Management:    Induction:   Airway Management Planned:   Additional Equipment:   Intra-op Plan:   Post-operative Plan:   Informed Consent: I have reviewed the patients History and Physical, chart, labs and discussed the procedure including the risks, benefits and alternatives for the proposed anesthesia with the patient or authorized representative who has indicated his/her understanding and acceptance.   Dental Advisory Given  Plan Discussed with: Anesthesiologist, CRNA and Surgeon  Anesthesia Plan Comments:         Anesthesia Quick Evaluation

## 2016-06-19 NOTE — ED Notes (Signed)
Patient transported to Ultrasound 

## 2016-06-19 NOTE — Op Note (Signed)
  Operative Note  06/19/2016 1:12 PM  PRE-OP DIAGNOSIS: Incomplete Abortion  POST-OP DIAGNOSIS: same  SURGEON: Barnett Applebaum, MD, FACOG  ANESTHESIA: Choice   PROCEDURE: Procedure(s): DILATATION AND EVACUATION   ESTIMATED BLOOD LOSS: Minimal   SPECIMENS: POC  COMPLICATIONS: none  DISPOSITION: PACU - hemodynamically stable.  CONDITION: stable  FINDINGS: Exam under anesthesia revealed a 10 wk size uterus without palpable adnexal masses.   INDICATION FOR PROCEDURE: Bleeding 11 weeks pregnancy with cervical dilation and no visible IUP by ultrasound (after viable FHTs seen last month).  PROCEDURE IN DETAIL: After informed consent was obtained, the patient was taken to the operating room where anesthesia was obtained without difficulty. The patient was positioned in the dorsal lithotomy position with [employer]. Time out was performed and an exam under anesthesia was performed. The vagina, perineum, and lower abdomen were prepped and draped in a normal sterile fashion. The bladder was emptied with an I&O catheter. A speculum was placed into the vagina and the cervix was grasped with a single toothed tenaculum. The uterus was sounded to 12cm.  The cervix was already dilated to equitable size 20 [place]. The suction was then tested and found to be adequate, and a 24mm rigid suction cannula was advanced into the uterine cavity. The suction was activated and the contents of the uterus were aspirated until no further tissue was obtained. The uterus was then curetted to gritty texture throughout.  At the end of the procedure bleeding was noted to be Moderate.  All instruments were then removed from the vagina.The patient tolerated the procedure well. All sponge, instrument, and needle counts were correct. The patient was taken to the recovery room in good condition.

## 2016-06-19 NOTE — ED Triage Notes (Signed)
Pt arrives today with reports of light spotting yesterday and heavy vaginal bleeding this am  She is [redacted] weeks pregnant

## 2016-06-19 NOTE — Anesthesia Procedure Notes (Signed)
Procedure Name: LMA Insertion Date/Time: 06/19/2016 12:50 PM Performed by: Doreen Salvage Pre-anesthesia Checklist: Patient identified, Patient being monitored, Timeout performed, Emergency Drugs available and Suction available Patient Re-evaluated:Patient Re-evaluated prior to inductionOxygen Delivery Method: Circle system utilized Preoxygenation: Pre-oxygenation with 100% oxygen Intubation Type: IV induction Ventilation: Mask ventilation without difficulty LMA: LMA inserted LMA Size: 4.5 Tube type: Oral Number of attempts: 1 Placement Confirmation: positive ETCO2 and breath sounds checked- equal and bilateral Tube secured with: Tape Dental Injury: Teeth and Oropharynx as per pre-operative assessment

## 2016-06-19 NOTE — ED Notes (Signed)
Patient is 10 weeks and 6 days pregnant with moderate vaginal bleeding, similar to a period, per patient.  Patient states bleeding started two days ago but was very light at that time.  Patient reports small amount of abdominal pain/cramping.  This is patient's first pregnancy.

## 2016-06-19 NOTE — Progress Notes (Signed)
[place] [id number] [place] [address] Phone: [phone]  June 19, 2016  Patient: Jenny Hendrix  Date of Birth: [birth year]  Date of Visit: 06/19/2016    To Whom It May Concern:  Derionna Hudman was seen and treated in our Labor and [facility] on 06/19/2016. PAMALE MUMFORD  may return to work on 06/23/16.  Sincerely,  Barnett Applebaum, MD [facility]

## 2016-06-19 NOTE — ED Notes (Signed)
Patient reports bleeding has gotten heavier and that patient has soaked through a pad in approx. 30 min.  Patient states pain is increasing.

## 2016-06-20 ENCOUNTER — Encounter: Payer: Self-pay | Admitting: Obstetrics & Gynecology

## 2016-06-20 LAB — SURGICAL PATHOLOGY

## 2016-06-20 NOTE — Anesthesia Postprocedure Evaluation (Signed)
Anesthesia Post Note  Patient: Jenny Hendrix  Procedure(s) Performed: Procedure(s) (LRB): DILATATION AND EVACUATION (N/A)  Patient location during evaluation: PACU Anesthesia Type: General Level of consciousness: awake and alert Pain management: pain level controlled Vital Signs Assessment: post-procedure vital signs reviewed and stable Respiratory status: spontaneous breathing, nonlabored ventilation, respiratory function stable and patient connected to nasal cannula oxygen Cardiovascular status: blood pressure returned to baseline and stable Postop Assessment: no signs of nausea or vomiting Anesthetic complications: no     Last Vitals:  Vitals:   06/19/16 1422 06/19/16 1444  BP: 120/72 107/64  Pulse: 80 83  Resp: 14 16  Temp: 36.6 C     Last Pain:  Vitals:   06/20/16 0955  TempSrc:   PainSc: 0-No pain                 Martha Clan

## 2016-06-26 ENCOUNTER — Encounter: Payer: Self-pay | Admitting: Advanced Practice Midwife

## 2016-06-30 LAB — HM PAP SMEAR
HPV Aptima: NEGATIVE
Pap: ABNORMAL — AB

## 2016-07-03 ENCOUNTER — Ambulatory Visit: Payer: BLUE CROSS/BLUE SHIELD | Admitting: Obstetrics & Gynecology

## 2017-01-12 ENCOUNTER — Ambulatory Visit (INDEPENDENT_AMBULATORY_CARE_PROVIDER_SITE_OTHER): Payer: BLUE CROSS/BLUE SHIELD | Admitting: Obstetrics & Gynecology

## 2017-01-12 ENCOUNTER — Encounter: Payer: Self-pay | Admitting: Obstetrics & Gynecology

## 2017-01-12 ENCOUNTER — Telehealth: Payer: Self-pay

## 2017-01-12 VITALS — BP 120/80 | Wt 208.0 lb

## 2017-01-12 DIAGNOSIS — Z3A01 Less than 8 weeks gestation of pregnancy: Secondary | ICD-10-CM

## 2017-01-12 DIAGNOSIS — R102 Pelvic and perineal pain: Secondary | ICD-10-CM | POA: Insufficient documentation

## 2017-01-12 DIAGNOSIS — O209 Hemorrhage in early pregnancy, unspecified: Secondary | ICD-10-CM | POA: Diagnosis not present

## 2017-01-12 LAB — OB RESULTS CONSOLE GC/CHLAMYDIA
CHLAMYDIA, DNA PROBE: NEGATIVE
GC PROBE AMP, GENITAL: NEGATIVE

## 2017-01-12 NOTE — Telephone Encounter (Signed)
Pt returned call.  Appt made

## 2017-01-12 NOTE — Patient Instructions (Signed)
Ultrasound soon  Commonly Asked Questions During Pregnancy  Cats: A parasite can be excreted in cat feces.  To avoid exposure you need to have another person empty the little box.  If you must empty the litter box you will need to wear gloves.  Wash your hands after handling your cat.  This parasite can also be found in raw or undercooked meat so this should also be avoided.  Colds, Sore Throats, Flu: Please check your medication sheet to see what you can take for symptoms.  If your symptoms are unrelieved by these medications please call the office.  Dental Work: Most any dental work Investment banker, corporate recommends is permitted.  X-rays should only be taken during the first trimester if absolutely necessary.  Your abdomen should be shielded with a lead apron during all x-rays.  Please notify your provider prior to receiving any x-rays.  Novocaine is fine; gas is not recommended.  If your dentist requires a note from [place] prior to dental work please call the office and we will provide one for you.  Exercise: Exercise is an important part of staying healthy during your pregnancy.  You may continue most exercises you were accustomed to prior to pregnancy.  Later in your pregnancy you will most likely notice you have difficulty with activities requiring balance like riding a bicycle.  It is important that you listen to your body and avoid activities that put you at a higher risk of falling.  Adequate rest and staying well hydrated are a must!  If you have questions about the safety of specific activities ask your provider.    Exposure to Children with illness: Try to avoid obvious exposure; report any symptoms to [place] when noted,  If you have chicken pos, red measles or mumps, you should be immune to these diseases.   Please do not take any vaccines while pregnant unless you have checked with your OB provider.  Fetal Movement: After 28 weeks we recommend you do "kick counts" twice daily.  Lie or sit down in a calm  quiet environment and count your baby movements "kicks".  You should feel your baby at least 10 times per hour.  If you have not felt 10 kicks within the first hour get up, walk around and have something sweet to eat or drink then repeat for an additional hour.  If count remains less than 10 per hour notify your provider.  Fumigating: Follow your pest control agent's advice as to how long to stay out of your home.  Ventilate the area well before re-entering.  Hemorrhoids:   Most over-the-counter preparations can be used during pregnancy.  Check your medication to see what is safe to use.  It is important to use a stool softener or fiber in your diet and to drink lots of liquids.  If hemorrhoids seem to be getting worse please call the office.   Hot Tubs:  Hot tubs Jacuzzis and saunas are not recommended while pregnant.  These increase your internal body temperature and should be avoided.  Intercourse:  Sexual intercourse is safe during pregnancy as long as you are comfortable, unless otherwise advised by your provider.  Spotting may occur after intercourse; report any bright red bleeding that is heavier than spotting.  Labor:  If you know that you are in labor, please go to the hospital.  If you are unsure, please call the office and let us help you decide what to do.  Lifting, straining, etc:  If your  job requires heavy lifting or straining please check with your provider for any limitations.  Generally, you should not lift items heavier than that you can lift simply with your hands and arms (no back muscles)  Painting:  Paint fumes do not harm your pregnancy, but may make you ill and should be avoided if possible.  Latex or water based paints have less odor than oils.  Use adequate ventilation while painting.  Permanents & Hair Color:  Chemicals in hair dyes are not recommended as they cause increase hair dryness which can increase hair loss during pregnancy.  " Highlighting" and permanents are  allowed.  Dye may be absorbed differently and permanents may not hold as well during pregnancy.  Sunbathing:  Use a sunscreen, as skin burns easily during pregnancy.  Drink plenty of fluids; avoid over heating.  Tanning Beds:  Because their possible side effects are still unknown, tanning beds are not recommended.  Ultrasound Scans:  Routine ultrasounds are performed at approximately 20 weeks.  You will be able to see your baby's general anatomy an if you would like to know the gender this can usually be determined as well.  If it is questionable when you conceived you may also receive an ultrasound early in your pregnancy for dating purposes.  Otherwise ultrasound exams are not routinely performed unless there is a medical necessity.  Although you can request a scan we ask that you pay for it when conducted because insurance does not cover " patient request" scans.  Work: If your pregnancy proceeds without complications you may work until your due date, unless your physician or employer advises otherwise.  Round Ligament Pain/Pelvic Discomfort:  Sharp, shooting pains not associated with bleeding are fairly common, usually occurring in the second trimester of pregnancy.  They tend to be worse when standing up or when you remain standing for long periods of time.  These are the result of pressure of certain pelvic ligaments called "round ligaments".  Rest, Tylenol and heat seem to be the most effective relief.  As the womb and fetus grow, they rise out of the pelvis and the discomfort improves.  Please notify the office if your pain seems different than that described.  It may represent a more serious condition.

## 2017-01-12 NOTE — Progress Notes (Signed)
Obstetric Problem Visit   Chief Complaint:  Chief Complaint  Patient presents with  . Pelvic Pain        And Bleeding in First Trimester (7 weeks)  History of Present Illness: Patient is a 36 y.o. G2P0010 [redacted]w[redacted]d presenting for first trimester bleeding.  The onset of bleeding was at 4 weeks mark and then occas, very light.  Pain has been intermittant and between right and left lower quadrants, no radiation, no assoc sx's, no modifiers, mild.  Is bleeding equal to or greater than normal menstrual flow:  No Any recent trauma:  No Recent intercourse:  No History of prior miscarriage:  Yes Prior ultrasound demonstrating IUP:  No Prior ultrasound demonstrating viable IUP:  No Prior Serum HCG:  No Rh status: A POS  PMHx: She  has a past medical history of Anxiety and ASCUS of cervix with negative high risk HPV (05/22/2016). Also,  has a past surgical history that includes Dilation and evacuation (N/A, 06/19/2016)., family history includes Breast cancer in her maternal grandmother; Diabetes in her mother; Hyperlipidemia in her maternal grandmother; Hypertension in her maternal grandmother; Thyroid disease in her maternal grandmother.,  reports that she has never smoked. She has never used smokeless tobacco. She reports that she does not drink alcohol or use drugs.  She has a current medication list which includes the following prescription(s): citalopram, levothyroxine, lorazepam, and multivitamin-prenatal. Also, is allergic to benzonatate and metronidazole.  Review of Systems  Constitutional: Negative for chills, fever and malaise/fatigue.  HENT: Negative for congestion, sinus pain and sore throat.   Eyes: Negative for blurred vision and pain.  Respiratory: Negative for cough and wheezing.   Cardiovascular: Negative for chest pain and leg swelling.  Gastrointestinal: Negative for abdominal pain, constipation, diarrhea, heartburn, nausea and vomiting.  Genitourinary: Negative for dysuria,  frequency, hematuria and urgency.  Musculoskeletal: Negative for back pain, joint pain, myalgias and neck pain.  Skin: Negative for itching and rash.  Neurological: Negative for dizziness, tremors and weakness.  Endo/Heme/Allergies: Does not bruise/bleed easily.  Psychiatric/Behavioral: Negative for depression. The patient is not nervous/anxious and does not have insomnia.     Objective: Vitals:   01/12/17 1533  BP: 120/80   Physical Exam  Constitutional: She is oriented to person, place, and time. She appears well-developed and well-nourished. No distress.  Genitourinary: Rectum normal, vagina normal and uterus normal. Pelvic exam was performed with patient supine. There is no rash or lesion on the right labia. There is no rash or lesion on the left labia. Vagina exhibits no lesion. No bleeding in the vagina. Right adnexum does not display mass and does not display tenderness. Left adnexum does not display mass and does not display tenderness. Cervix does not exhibit motion tenderness, lesion, friability or polyp.   Uterus is mobile and retroverted. Uterus is not enlarged or exhibiting a mass.  Genitourinary Comments: Cervix not dilate, no bleeding, and uterus small retroverted  HENT:  Head: Normocephalic and atraumatic. Head is without laceration.  Right Ear: Hearing normal.  Left Ear: Hearing normal.  Nose: No epistaxis.  No foreign bodies.  Mouth/Throat: Uvula is midline, oropharynx is clear and moist and mucous membranes are normal.  Eyes: Pupils are equal, round, and reactive to light.  Neck: Normal range of motion. Neck supple. No thyromegaly present.  Cardiovascular: Normal rate and regular rhythm.  Exam reveals no gallop and no friction rub.   No murmur heard. Pulmonary/Chest: Effort normal and breath sounds normal. No respiratory distress. She has  no wheezes. Right breast exhibits no mass, no skin change and no tenderness. Left breast exhibits no mass, no skin change and no  tenderness.  Abdominal: Soft. Bowel sounds are normal. She exhibits no distension. There is no tenderness. There is no rebound.  Musculoskeletal: Normal range of motion.  Neurological: She is alert and oriented to person, place, and time. No cranial nerve deficit.  Skin: Skin is warm and dry.  Psychiatric: She has a normal mood and affect. Judgment normal.  Vitals reviewed.   Assessment: 36 y.o. G2P0010 [redacted]w[redacted]d 1. Pelvic pain in female - US OB Transvaginal; Future - IGP,CtNg,AptimaHPV  2. First-trimester bleeding  3. [redacted] weeks gestation of pregnancy  Plan: Problem List Items Addressed This Visit      Other   Pelvic pain in female - Primary   Relevant Orders   US OB Transvaginal   IGP,CtNg,AptimaHPV   First-trimester bleeding    Other Visit Diagnoses    [redacted] weeks gestation of pregnancy        PAP and cultures done today  1) First trimester bleeding - incidence and clinical course of first trimester bleeding is discussed in detail with the patient today.  Approximately 1/3 of pregnancies ending in live births experienced 1st trimester bleeding.  The amount of bleeding is variable and not necessarily predictive of outcome.  Sources may be cervical or uterine.  Subchorionic hemorrhages are a frequent concurrent findings on ultrasound and are followed expectantly.  These often absorb or regress spontaneously although risk for expansion and further disruption of the utero-placental interface leading to miscarriage is possible.  There is no clearly documented benefit to limiting or modifying activity and sexual intercourse in altering clinic course of 1st trimester bleeding.    2) If not already done will proceed with TVUS evaluation to document viability, and if uncertain viability or absence of a demonstrable IUP (and no previous documentation of IUP) will trend HCG levels.  3) The patient is Rh POS, rhogam is therefore not indicated to decrease the risk rhesus alloimmunization.    4)  Routine bleeding precautions were discussed with the patient prior the conclusion of today's visit.  5) Desires genetic testing for Down Syndrome this pregnancy  As for [facility]: 1) Avoid alcoholic beverages. 2) Patient encouraged not to smoke.  3) Discontinue the use of all non-medicinal drugs and chemicals.  4) Take prenatal vitamins daily.  5) Seatbelt use advised 6) Nutrition, food safety (fish, cheese advisories, and high nitrite foods) and exercise discussed. 7) Hospital and practice style delivering at [facility] discussed  8) Patient is asked about travel to areas at risk for the [place] virus, and counseled to avoid travel and exposure to mosquitoes or sexual partners who may have themselves been exposed to the virus. Testing is discussed, and will be ordered as appropriate.  9) Childbirth classes at [facility] advised 10) Genetic Screening, such as with 1st Trimester Screening, cell free fetal DNA, AFP testing, and Ultrasound, as well as with amniocentesis and [facility] as appropriate, is discussed with patient. She plans to have genetic testing this pregnancy.  Barnett Applebaum, MD, [facility], [facility] 01/12/2017  4:00 PM

## 2017-01-12 NOTE — Telephone Encounter (Signed)
Pt called after hour nurse line 01/11/17 at 7:43pm c/o severe left sided, burning, stabbing pelvic pain x2wks, no bleeding, hasn't seen MD yet.  Appt 9/25th.  [phone]  LMTC.

## 2017-01-13 ENCOUNTER — Ambulatory Visit (INDEPENDENT_AMBULATORY_CARE_PROVIDER_SITE_OTHER): Payer: BLUE CROSS/BLUE SHIELD | Admitting: Obstetrics & Gynecology

## 2017-01-13 ENCOUNTER — Ambulatory Visit (INDEPENDENT_AMBULATORY_CARE_PROVIDER_SITE_OTHER): Payer: BLUE CROSS/BLUE SHIELD

## 2017-01-13 VITALS — BP 130/80 | Wt 208.0 lb

## 2017-01-13 DIAGNOSIS — R102 Pelvic and perineal pain: Secondary | ICD-10-CM | POA: Diagnosis not present

## 2017-01-13 DIAGNOSIS — Z3A01 Less than 8 weeks gestation of pregnancy: Secondary | ICD-10-CM

## 2017-01-13 DIAGNOSIS — O209 Hemorrhage in early pregnancy, unspecified: Secondary | ICD-10-CM

## 2017-01-13 NOTE — Patient Instructions (Signed)
Ultrasound good today Due date 08/30/17  Labs and genetic ultrasound next

## 2017-01-13 NOTE — Progress Notes (Signed)
Labs today, First screen nv. Review of ULTRASOUND.    I have personally reviewed images and report of recent ultrasound done at [facility].    Plan of management to be discussed with patient.

## 2017-01-14 LAB — URINE CULTURE: ORGANISM ID, BACTERIA: NO GROWTH

## 2017-01-14 LAB — CBC
HEMATOCRIT: 37.2 % (ref 34.0–46.6)
HEMOGLOBIN: 12.6 g/dL (ref 11.1–15.9)
MCH: 29.4 pg (ref 26.6–33.0)
MCHC: 33.9 g/dL (ref 31.5–35.7)
MCV: 87 fL (ref 79–97)
Platelets: 383 10*3/uL — ABNORMAL HIGH (ref 150–379)
RBC: 4.29 x10E6/uL (ref 3.77–5.28)
RDW: 13.4 % (ref 12.3–15.4)
WBC: 10.4 10*3/uL (ref 3.4–10.8)

## 2017-01-14 LAB — RUBELLA SCREEN: RUBELLA: 6.72 {index} (ref >0.99)

## 2017-01-14 LAB — HIV ANTIBODY (ROUTINE TESTING W REFLEX): HIV SCREEN 4TH GENERATION: NONREACTIVE

## 2017-01-14 LAB — RPR: RPR: NONREACTIVE

## 2017-01-14 LAB — ANTIBODY SCREEN: ANTIBODY SCREEN: NEGATIVE

## 2017-01-14 LAB — HEPATITIS B SURFACE ANTIGEN: HEP B S AG: NEGATIVE

## 2017-01-14 LAB — VARICELLA ZOSTER ANTIBODY, IGG: VARICELLA: 542 {index} (ref >165)

## 2017-01-14 LAB — ABO

## 2017-01-15 LAB — IGP,CTNG,APTIMAHPV
Chlamydia, Nuc. Acid Amp: NEGATIVE
GONOCOCCUS BY NUCLEIC ACID AMP: NEGATIVE
HPV Aptima: NEGATIVE
PAP SMEAR COMMENT: 0

## 2017-01-19 ENCOUNTER — Telehealth: Payer: Self-pay

## 2017-01-19 NOTE — Telephone Encounter (Signed)
Pt needs note stating it is okay for her to get the flu shot at her job.  Note to be faxed to [phone].  Done  Pt also called at 11:32 stating she had drawn up Hep B vaccine and accidentally poked herself in the stomach, clean needle, clean syringe.  Adv per Eagar no harm to baby.  To follow employer's protocol.

## 2017-01-20 ENCOUNTER — Encounter: Payer: BLUE CROSS/BLUE SHIELD | Admitting: Advanced Practice Midwife

## 2017-01-27 ENCOUNTER — Encounter: Payer: Self-pay | Admitting: Certified Nurse Midwife

## 2017-01-27 ENCOUNTER — Telehealth: Payer: Self-pay

## 2017-01-27 ENCOUNTER — Other Ambulatory Visit (INDEPENDENT_AMBULATORY_CARE_PROVIDER_SITE_OTHER): Payer: BLUE CROSS/BLUE SHIELD

## 2017-01-27 ENCOUNTER — Ambulatory Visit (INDEPENDENT_AMBULATORY_CARE_PROVIDER_SITE_OTHER): Payer: BLUE CROSS/BLUE SHIELD | Admitting: Certified Nurse Midwife

## 2017-01-27 VITALS — BP 120/80 | Wt 208.0 lb

## 2017-01-27 DIAGNOSIS — F419 Anxiety disorder, unspecified: Secondary | ICD-10-CM

## 2017-01-27 DIAGNOSIS — O200 Threatened abortion: Secondary | ICD-10-CM

## 2017-01-27 DIAGNOSIS — R1032 Left lower quadrant pain: Secondary | ICD-10-CM

## 2017-01-27 DIAGNOSIS — O0990 Supervision of high risk pregnancy, unspecified, unspecified trimester: Secondary | ICD-10-CM

## 2017-01-27 DIAGNOSIS — O99210 Obesity complicating pregnancy, unspecified trimester: Secondary | ICD-10-CM | POA: Insufficient documentation

## 2017-01-27 DIAGNOSIS — O99280 Endocrine, nutritional and metabolic diseases complicating pregnancy, unspecified trimester: Secondary | ICD-10-CM

## 2017-01-27 DIAGNOSIS — K219 Gastro-esophageal reflux disease without esophagitis: Secondary | ICD-10-CM | POA: Insufficient documentation

## 2017-01-27 DIAGNOSIS — F329 Major depressive disorder, single episode, unspecified: Secondary | ICD-10-CM

## 2017-01-27 DIAGNOSIS — E039 Hypothyroidism, unspecified: Secondary | ICD-10-CM

## 2017-01-27 NOTE — Progress Notes (Signed)
Presents at [facility] with complaints of left lower quadrant abdominal burning and cramping. No further bleeding (see 01/13/17 note) Very anxious-continues on Celexa and trying to wean down from lorazapam-now taking 1.5 mgm daily History of prior SAB requiring D&C earlier this year Ultrasound today: viable fetus with CRL 9wk1day, FCA 170. Reviewed NOB labs Return in 2 weeks for first trimester test, TSH, free T4 (on Synthroid 50 mcg daily) Will need to schedule early 1 hour GTT Dalia Heading, CNM

## 2017-01-27 NOTE — Telephone Encounter (Signed)
Pt called nurse line at 10:52 this am stating she had called on call last night and was told to be seen today re faint line on preg test at 9wks, ongoing pain which she isn't too worried about.  Of she goes to Urgent Care - what does she tell them?  Draw blood?  [phone].  SP to call pt to sched appt.

## 2017-01-27 NOTE — Telephone Encounter (Signed)
Pt called after hours yesterday, c/o pelvic pain and pressure. H/o of miscarriage. Tried to call pt with no answer. Can you try to get in touch with pt and schedule an appt please?

## 2017-01-27 NOTE — Telephone Encounter (Signed)
Pt is schedule 01/27/17 with CLG

## 2017-01-27 NOTE — Progress Notes (Signed)
Pt c/o lower left pelvic pain. Took UPT yesterday with lighter result than previously taken. Anxious due to miscarriage earlier this year.

## 2017-02-10 ENCOUNTER — Ambulatory Visit (INDEPENDENT_AMBULATORY_CARE_PROVIDER_SITE_OTHER): Payer: BLUE CROSS/BLUE SHIELD

## 2017-02-10 ENCOUNTER — Ambulatory Visit (INDEPENDENT_AMBULATORY_CARE_PROVIDER_SITE_OTHER): Payer: BLUE CROSS/BLUE SHIELD | Admitting: Obstetrics & Gynecology

## 2017-02-10 VITALS — BP 120/70 | Wt 211.0 lb

## 2017-02-10 DIAGNOSIS — Z362 Encounter for other antenatal screening follow-up: Secondary | ICD-10-CM

## 2017-02-10 DIAGNOSIS — Z3A01 Less than 8 weeks gestation of pregnancy: Secondary | ICD-10-CM | POA: Diagnosis not present

## 2017-02-10 DIAGNOSIS — O99280 Endocrine, nutritional and metabolic diseases complicating pregnancy, unspecified trimester: Secondary | ICD-10-CM

## 2017-02-10 DIAGNOSIS — Z3A11 11 weeks gestation of pregnancy: Secondary | ICD-10-CM

## 2017-02-10 DIAGNOSIS — O99210 Obesity complicating pregnancy, unspecified trimester: Secondary | ICD-10-CM

## 2017-02-10 DIAGNOSIS — O0990 Supervision of high risk pregnancy, unspecified, unspecified trimester: Secondary | ICD-10-CM

## 2017-02-10 DIAGNOSIS — E039 Hypothyroidism, unspecified: Secondary | ICD-10-CM

## 2017-02-10 NOTE — Progress Notes (Signed)
Doing well, min nausea, poor sleep, has cut down on Ativan use to 1 mg nightly, encouraged to taper off, advised Benedryl, Unisom, or Phenergan for sleep if needed Review of ULTRASOUND.    I have personally reviewed images and report of recent ultrasound done at [facility].    Plan of management to be discussed with patient. Measurements too early for first screen so will repeat one week Also have glucola next week and TSH

## 2017-02-20 ENCOUNTER — Ambulatory Visit: Payer: BLUE CROSS/BLUE SHIELD

## 2017-02-20 ENCOUNTER — Ambulatory Visit (INDEPENDENT_AMBULATORY_CARE_PROVIDER_SITE_OTHER): Payer: BLUE CROSS/BLUE SHIELD | Admitting: Advanced Practice Midwife

## 2017-02-20 ENCOUNTER — Other Ambulatory Visit: Payer: Self-pay | Admitting: Certified Nurse Midwife

## 2017-02-20 VITALS — BP 118/74 | Wt 212.0 lb

## 2017-02-20 DIAGNOSIS — Z3A11 11 weeks gestation of pregnancy: Secondary | ICD-10-CM

## 2017-02-20 DIAGNOSIS — O99280 Endocrine, nutritional and metabolic diseases complicating pregnancy, unspecified trimester: Secondary | ICD-10-CM

## 2017-02-20 DIAGNOSIS — E039 Hypothyroidism, unspecified: Secondary | ICD-10-CM

## 2017-02-20 DIAGNOSIS — O219 Vomiting of pregnancy, unspecified: Secondary | ICD-10-CM

## 2017-02-20 DIAGNOSIS — Z3A12 12 weeks gestation of pregnancy: Secondary | ICD-10-CM

## 2017-02-20 MED ORDER — PROMETHAZINE HCL 25 MG PO TABS: 25.0000 mg | ORAL_TABLET | Freq: Three times a day (TID) | ORAL | 2 refills | Status: DC | PRN

## 2017-02-20 NOTE — Progress Notes (Signed)
U/s, 1 hr gtt today.

## 2017-02-20 NOTE — Progress Notes (Signed)
  Routine Prenatal Care Visit  Subjective  RAELEE Hendrix is a 36 y.o. G2P0010 at [redacted]w[redacted]d being seen today for ongoing prenatal care.  She is currently monitored for the following issues for this high-risk pregnancy and has Pelvic pain in female; First-trimester bleeding; Anxiety and depression; Supervision of high risk pregnancy, antepartum; Obesity in pregnancy; GERD (gastroesophageal reflux disease); and Hypothyroidism affecting pregnancy, antepartum on her problem list.  ----------------------------------------------------------------------------------- Patient reports difficulty sleeping. She is currently weaning off ativan and she is requesting phenergan for both sleep and anti nausea..    Krista Blue. Bleeding: None.   . Denies leaking of fluid. 1 hour glucose today.  ----------------------------------------------------------------------------------- The following portions of the patient's history were reviewed and updated as appropriate: allergies, current medications, past family history, past medical history, past social history, past surgical history and problem list. Problem list updated.   Objective  Blood pressure 118/74, weight 212 lb (96.2 kg), last menstrual period 11/23/2016. Pregravid weight 212 lb (96.2 kg) Total Weight Gain 0 lb (0 kg) Urinalysis: Urine Protein: Negative Urine Glucose: Negative  Fetal Status:           NT scan today: NT 1.4 mm, CRL 68.2 mm, nasal bone present   General:  Alert, oriented and cooperative. Patient is in no acute distress.  Skin: Skin is warm and dry. No rash noted.   Cardiovascular: Normal heart rate noted  Respiratory: Normal respiratory effort, no problems with respiration noted  Abdomen: Soft, gravid, appropriate for gestational age.       Pelvic:  Cervical exam deferred        Extremities: Normal range of motion.     Mental Status: Normal mood and affect. Normal behavior. Normal judgment and thought content.   Assessment   36 y.o.  G2P0010 at [redacted]w[redacted]d by  08/30/2017, by Last Menstrual Period presenting for routine prenatal visit  Plan   pregnany 2  Problems (from 11/23/16 to present)    No problems associated with this episode.       Preterm labor symptoms and general obstetric precautions including but not limited to vaginal bleeding, contractions, leaking of fluid and fetal movement were reviewed in detail with the patient.   Return in about 4 weeks (around 03/20/2017) for rob.  Rod Can, CNM  02/20/2017 12:06 PM

## 2017-02-21 LAB — GLUCOSE, 1 HOUR GESTATIONAL: GESTATIONAL DIABETES SCREEN: 148 mg/dL — AB (ref 65–139)

## 2017-02-21 LAB — TSH+FREE T4
Free T4: 1.12 ng/dL (ref 0.82–1.77)
TSH: 3.69 u[IU]/mL (ref 0.450–4.500)

## 2017-02-24 ENCOUNTER — Telehealth: Payer: Self-pay

## 2017-02-24 ENCOUNTER — Other Ambulatory Visit: Payer: Self-pay | Admitting: Advanced Practice Midwife

## 2017-02-24 DIAGNOSIS — R7309 Other abnormal glucose: Secondary | ICD-10-CM

## 2017-02-24 LAB — FIRST TRIMESTER SCREEN W/NT
CRL: 68.2 mm
DIA MOM: 0.53
DIA VALUE: 98.9 pg/mL
Gest Age-Collect: 12.9 weeks
HCG MOM: 1.01
Maternal Age At EDD: 34.6 yr
Nuchal Translucency MoM: 0.83
Nuchal Translucency: 1.4 mm
Number of Fetuses: 1
PAPP-A MoM: 1.11
PAPP-A VALUE: 773.3 ng/mL
Test Results:: NEGATIVE
Weight: 212 [lb_av]
hCG Value: 68 IU/mL

## 2017-02-24 NOTE — Telephone Encounter (Signed)
Pt calling for glucose and thyroid test results. Please contact due to elevated results. Thank you. Pt cb# [phone]

## 2017-02-24 NOTE — Telephone Encounter (Signed)
Spoke to patient to give her results and still waiting for thyroid level.

## 2017-02-24 NOTE — Telephone Encounter (Signed)
Pt calling again this afternoon to make sure message was received this morning regarding test results. Thank you!

## 2017-03-01 NOTE — Progress Notes (Signed)
Sch 3 hour GTT soon due to elevation in screening glucola test

## 2017-03-02 ENCOUNTER — Telehealth: Payer: Self-pay | Admitting: Obstetrics & Gynecology

## 2017-03-02 NOTE — Telephone Encounter (Signed)
lvm for pt to call back to be schedule for 3 gtt.

## 2017-03-02 NOTE — Telephone Encounter (Signed)
-----   Message from Quintella Baton, [place] sent at 03/02/2017  7:57 AM EST -----   ----- Message ----- From: Gae Dry, MD Sent: 03/01/2017   1:03 PM To: Quintella Baton, CMA  Sch 3 hour GTT soon due to elevation in screening glucola test

## 2017-03-03 ENCOUNTER — Other Ambulatory Visit: Payer: Self-pay | Admitting: Advanced Practice Midwife

## 2017-03-03 MED ORDER — LEVOTHYROXINE SODIUM 50 MCG PO TABS: 75.0000 ug | ORAL_TABLET | Freq: Every day | ORAL | 3 refills | Status: DC

## 2017-03-03 NOTE — Telephone Encounter (Signed)
Pt is scheduled 03/05/17

## 2017-03-04 ENCOUNTER — Telehealth: Payer: Self-pay

## 2017-03-04 NOTE — Telephone Encounter (Signed)
Pt states JEG contacted her yesterday & said she was sending in Levothyroxin 50mcg to [facility]. Pt states pharmacy still doesn't have the rx. Requesting it to be sent/resent. Cb#[phone].

## 2017-03-05 ENCOUNTER — Other Ambulatory Visit (INDEPENDENT_AMBULATORY_CARE_PROVIDER_SITE_OTHER): Payer: BLUE CROSS/BLUE SHIELD

## 2017-03-05 DIAGNOSIS — R7309 Other abnormal glucose: Secondary | ICD-10-CM

## 2017-03-05 MED ORDER — LEVOTHYROXINE SODIUM 50 MCG PO TABS: 75.0000 ug | ORAL_TABLET | Freq: Every day | ORAL | 3 refills | Status: DC

## 2017-03-05 NOTE — Telephone Encounter (Signed)
I must have sent it to the [facility]. I just sent it to [facility]!

## 2017-03-06 LAB — GESTATIONAL GLUCOSE TOLERANCE
GLUCOSE 1 HOUR GTT: 153 mg/dL (ref 65–179)
GLUCOSE FASTING: 81 mg/dL (ref 65–94)
Glucose, GTT - 2 Hour: 136 mg/dL (ref 65–154)
Glucose, GTT - 3 Hour: 40 mg/dL — ABNORMAL LOW (ref 65–139)

## 2017-03-09 ENCOUNTER — Telehealth: Payer: Self-pay

## 2017-03-09 NOTE — Telephone Encounter (Signed)
Pt states levothyroxin 75mcg still hasn't been called - it's been three days.  [phone]  Called pt to see which pharm it was supposed to be sent to.  Pt states [facility] [address].  Adv that is where it was sent.  Pt will call pharm to ck on.

## 2017-03-24 ENCOUNTER — Ambulatory Visit (INDEPENDENT_AMBULATORY_CARE_PROVIDER_SITE_OTHER): Payer: BLUE CROSS/BLUE SHIELD | Admitting: Maternal Newborn

## 2017-03-24 VITALS — BP 120/86 | Wt 210.0 lb

## 2017-03-24 DIAGNOSIS — Z3A17 17 weeks gestation of pregnancy: Secondary | ICD-10-CM

## 2017-03-24 DIAGNOSIS — O99282 Endocrine, nutritional and metabolic diseases complicating pregnancy, second trimester: Secondary | ICD-10-CM

## 2017-03-24 DIAGNOSIS — O0990 Supervision of high risk pregnancy, unspecified, unspecified trimester: Secondary | ICD-10-CM

## 2017-03-24 DIAGNOSIS — E039 Hypothyroidism, unspecified: Secondary | ICD-10-CM

## 2017-03-24 MED ORDER — LEVOTHYROXINE SODIUM 25 MCG PO TABS: 75.0000 ug | ORAL_TABLET | Freq: Every day | ORAL | 6 refills | Status: DC

## 2017-03-24 NOTE — Progress Notes (Signed)
Routine Prenatal Care Visit  Subjective  Jenny Hendrix is a 36 y.o. G2P0010 at [redacted]w[redacted]d being seen today for ongoing prenatal care.  She is currently monitored for the following issues for this high-risk pregnancy and has Pelvic pain in female; First-trimester bleeding; Anxiety and depression; Supervision of high risk pregnancy, antepartum; Obesity in pregnancy; GERD (gastroesophageal reflux disease); and Hypothyroidism affecting pregnancy, antepartum on their problem list.  ----------------------------------------------------------------------------------- Patient reports some stress due to family situation.  Heartburn and nausea are adequately controlled with medications. Only taking one quarter tablet of Ativan every so often when the stress is greatest. Contractions: Not present. Vag. Bleeding: None.  Movement: Present. Denies leaking of fluid.  ----------------------------------------------------------------------------------- The following portions of the patient's history were reviewed and updated as appropriate: allergies, current medications, past family history, past medical history, past social history, past surgical history and problem list. Problem list updated.   Objective  Blood pressure 120/86, weight 210 lb (95.3 kg), last menstrual period 11/23/2016. Pregravid weight 212 lb (96.2 kg) Total Weight Gain  (-0.907 kg) Urinalysis: Urine Protein: 1+ Urine Glucose: Negative  Fetal Status: Fetal Heart Rate (bpm): 147   Movement: Present     General:  Alert, oriented and cooperative. Patient is in no acute distress.  Skin: Skin is warm and dry. No rash noted.   Cardiovascular: Normal heart rate noted  Respiratory: Normal respiratory effort, no problems with respiration noted  Abdomen: Soft, gravid, appropriate for gestational age. Pain/Pressure: Absent     Pelvic:  Cervical exam deferred        Extremities: Normal range of motion.  Edema: None  Mental Status: Normal mood and  affect. Normal behavior. Normal judgment and thought content.     Assessment   36 y.o. G2P0010 at [redacted]w[redacted]d, EDD 08/30/2017 by Last Menstrual Period presenting for routine prenatal visit.  Plan   pregnany 2  Problems (from 11/23/16 to present)    Problem Noted Resolved   Supervision of high risk pregnancy, antepartum 01/27/2017 by Dalia Heading, CNM No   Overview Signed 01/27/2017  3:24 PM by Dalia Heading, CNM     Clinic  Prenatal Labs  Dating  Blood type: A POS  Genetic Screen 1 Screen:    AFP:     Quad:     NIPS: Antibody:Negative (09/18 1423)  Anatomic [place]  Rubella: immune. Varicella Immune  GTT Early:               Third trimester:  RPR: Non Reactive (09/18 1423)   Flu vaccine  HBsAg: Negative (09/18 1423)   TDaP vaccine                                               Rhogam: HIV: Non-reactive (01/25 0000)   Baby Food                                               GBS: (For PCN allergy, check sensitivities)  Contraception  Pap:  Circumcision    Pediatrician    Support Person                 Preterm labor symptoms and general obstetric precautions including but not limited to vaginal bleeding, contractions, leaking of fluid  and fetal movement were reviewed in detail with the patient.  Please refer to After Visit Summary for other counseling recommendations.   Return in about 3 weeks (around 04/14/2017) for ROB/anatomy scan.  Avel Sensor, CNM 03/24/2017  1:06 PM

## 2017-03-24 NOTE — Progress Notes (Signed)
C/o not feeling good since Sun, lots of stress at home - in between homes right now and living c grandmother, is tearful. rj

## 2017-03-24 NOTE — Patient Instructions (Signed)

## 2017-04-07 ENCOUNTER — Telehealth: Payer: Self-pay

## 2017-04-07 NOTE — Telephone Encounter (Signed)
Pt states she fell on the ice, landed on hands and knees, no bleeding, just felt something pull in stomach, wanted to know if she should be seen? I told her  I think she will be ok, I will ask a DR for advice and let her know.Marland Kitchenasap , please advise

## 2017-04-07 NOTE — Telephone Encounter (Signed)
She should be ok if she just landed on hands and knees and not on her belly. Precautions as usual for bleeding, cramping, etc.

## 2017-04-08 ENCOUNTER — Telehealth: Payer: Self-pay

## 2017-04-08 NOTE — Telephone Encounter (Signed)
Pt is preg and fell yesterday, called, didn't hear back from nurse who was going to ck c provider, a little right side cramping, some back pain, no bleeding. [phone].  Pt aware per JEG should be okay since didn't land on belly.  Pt is taking two e.s.tylenol.  Adv to cont two e.s.tyelnol q4-6hrs while awake and apply heat/ice to area 26min qhr.  If doesn't seem to help to call tomorrow pm for appt.

## 2017-04-08 NOTE — Telephone Encounter (Signed)
Called pt no answer °

## 2017-04-13 ENCOUNTER — Ambulatory Visit (INDEPENDENT_AMBULATORY_CARE_PROVIDER_SITE_OTHER): Payer: BLUE CROSS/BLUE SHIELD | Admitting: Obstetrics and Gynecology

## 2017-04-13 ENCOUNTER — Ambulatory Visit (INDEPENDENT_AMBULATORY_CARE_PROVIDER_SITE_OTHER): Payer: BLUE CROSS/BLUE SHIELD

## 2017-04-13 VITALS — BP 112/72 | Wt 208.0 lb

## 2017-04-13 DIAGNOSIS — O0990 Supervision of high risk pregnancy, unspecified, unspecified trimester: Secondary | ICD-10-CM | POA: Diagnosis not present

## 2017-04-13 DIAGNOSIS — Z3A20 20 weeks gestation of pregnancy: Secondary | ICD-10-CM

## 2017-04-13 DIAGNOSIS — Z362 Encounter for other antenatal screening follow-up: Secondary | ICD-10-CM

## 2017-04-13 DIAGNOSIS — O99280 Endocrine, nutritional and metabolic diseases complicating pregnancy, unspecified trimester: Secondary | ICD-10-CM

## 2017-04-13 DIAGNOSIS — F329 Major depressive disorder, single episode, unspecified: Secondary | ICD-10-CM

## 2017-04-13 DIAGNOSIS — F419 Anxiety disorder, unspecified: Secondary | ICD-10-CM

## 2017-04-13 DIAGNOSIS — O99210 Obesity complicating pregnancy, unspecified trimester: Secondary | ICD-10-CM

## 2017-04-13 DIAGNOSIS — E039 Hypothyroidism, unspecified: Secondary | ICD-10-CM

## 2017-04-13 DIAGNOSIS — K219 Gastro-esophageal reflux disease without esophagitis: Secondary | ICD-10-CM

## 2017-04-13 NOTE — Progress Notes (Signed)
Pt reports no problems. Anatomy scan today. It's a BOY!     Routine Prenatal Care Visit  Subjective  Jenny Hendrix is a 36 y.o. G2P0010 at [redacted]w[redacted]d being seen today for ongoing prenatal care.  She is currently monitored for the following issues for this high-risk pregnancy and has Pelvic pain in female; First-trimester bleeding; Anxiety and depression; Supervision of high risk pregnancy, antepartum; Obesity in pregnancy; GERD (gastroesophageal reflux disease); and Hypothyroidism affecting pregnancy, antepartum on their problem list.  ----------------------------------------------------------------------------------- Patient reports no complaints.   Contractions: Not present. Vag. Bleeding: None.   . Denies leaking of fluid.  ----------------------------------------------------------------------------------- The following portions of the patient's history were reviewed and updated as appropriate: allergies, current medications, past family history, past medical history, past social history, past surgical history and problem list. Problem list updated.   Objective  Blood pressure 112/72, weight 208 lb (94.3 kg), last menstrual period 11/23/2016. Pregravid weight 212 lb (96.2 kg) Total Weight Gain  (-1.814 kg) Urinalysis: Urine Protein: Trace Urine Glucose: Negative  Fetal Status: Fetal Heart Rate (bpm): 140         General:  Alert, oriented and cooperative. Patient is in no acute distress.  Skin: Skin is warm and dry. No rash noted.   Cardiovascular: Normal heart rate noted  Respiratory: Normal respiratory effort, no problems with respiration noted  Abdomen: Soft, gravid, appropriate for gestational age. Pain/Pressure: Absent     Pelvic:  Cervical exam deferred        Extremities: Normal range of motion.  Edema: None  ental Status: Normal mood and affect. Normal behavior. Normal judgment and thought content.     Assessment   36 y.o. G2P0010 at [redacted]w[redacted]d by  08/30/2017, by Last Menstrual  Period presenting for routine prenatal visit  Plan   pregnany 2  Problems (from 11/23/16 to present)    Problem Noted Resolved   Supervision of high risk pregnancy, antepartum 01/27/2017 by Dalia Heading, CNM No   Overview Addendum 04/13/2017  1:32 PM by Homero Fellers, MD       Clinic [facility] Prenatal Labs  Dating  Blood type: A/--/-- (09/18 1423)   Genetic Screen 1 Screen:  Negative   AFP:      Quad:      NIPS:    Antibody:Negative (09/18 1423)  Anatomic [place]  normal 12/17, missing heart images Rubella: 6.72 (09/18 1423) Varicella: Immune  GTT Early: negative       28 wk:      RPR: Non Reactive (09/18 1423)   Rhogam  Not needed HBsAg: Negative (09/18 1423)   TDaP vaccine                       HIV: Non-reactive (01/25 0000)   Flu Shot  Received at work GBS:   Contraception  Mirena IUD Pap: NIL HPV neg.   CBB     CS/VBAC  Not applicable   Baby Food  Breast   Support Person                   Preterm labor symptoms and general obstetric precautions including but not limited to vaginal bleeding, contractions, leaking of fluid and fetal movement were reviewed in detail with the patient. Please refer to After Visit Summary for other counseling recommendations.   TSH/T4 testing in 2 weeks at next appointment Repeat anatomy scan for heart images  Return in about 2 weeks (around 04/27/2017) for [place] and [place].

## 2017-04-18 ENCOUNTER — Emergency Department
Admission: EM | Admit: 2017-04-18 | Discharge: 2017-04-18 | Disposition: A | Discharge status: Home / Self Care | Payer: BLUE CROSS/BLUE SHIELD | Attending: Emergency Medicine | Admitting: Emergency Medicine

## 2017-04-18 ENCOUNTER — Encounter: Payer: Self-pay | Admitting: Emergency Medicine

## 2017-04-18 ENCOUNTER — Other Ambulatory Visit: Payer: Self-pay

## 2017-04-18 DIAGNOSIS — E039 Hypothyroidism, unspecified: Secondary | ICD-10-CM | POA: Insufficient documentation

## 2017-04-18 DIAGNOSIS — O26892 Other specified pregnancy related conditions, second trimester: Secondary | ICD-10-CM | POA: Diagnosis not present

## 2017-04-18 DIAGNOSIS — M5432 Sciatica, left side: Secondary | ICD-10-CM

## 2017-04-18 DIAGNOSIS — Z79899 Other long term (current) drug therapy: Secondary | ICD-10-CM | POA: Diagnosis not present

## 2017-04-18 DIAGNOSIS — Z3A20 20 weeks gestation of pregnancy: Secondary | ICD-10-CM | POA: Diagnosis not present

## 2017-04-18 DIAGNOSIS — M5442 Lumbago with sciatica, left side: Secondary | ICD-10-CM | POA: Insufficient documentation

## 2017-04-18 NOTE — ED Provider Notes (Signed)
[facility] Emergency Department Provider Note ____________________________________________   I have reviewed the triage vital signs and the triage nursing note.  HISTORY  Chief Complaint Back Pain and Vaginal Discharge   Historian Patient  HPI Jenny Hendrix is a 36 y.o. female G2 P1 presents approximately [redacted] weeks pregnant with left low back pain going down the left leg for about 24 hours.  She had a minor fall about 2 weeks ago with some soreness to her low back, but the extension down the leg started about 24 hours ago.  No incontinence of urine or stool.  No saddle anesthesia.  No abdominal pain.  Mild vaginal discharge which she feels like his typical.  No vaginal bleeding.  Works as a Writer.   Past Medical History:  Diagnosis Date  . Anxiety   . ASCUS of cervix with negative high risk HPV 05/22/2016    Patient Active Problem List   Diagnosis Date Noted  . Anxiety and depression 01/27/2017  . Supervision of high risk pregnancy, antepartum 01/27/2017  . Obesity in pregnancy 01/27/2017  . GERD (gastroesophageal reflux disease) 01/27/2017  . Hypothyroidism affecting pregnancy, antepartum 01/27/2017  . Pelvic pain in female 01/12/2017  . First-trimester bleeding 01/12/2017    Past Surgical History:  Procedure Laterality Date  . DILATION AND EVACUATION N/A 06/19/2016   Procedure: DILATATION AND EVACUATION;  Surgeon: Gae Dry, MD;  Location: [facility] ORS;  Service: Gynecology;  Laterality: N/A;  . TONSILLECTOMY AND ADENOIDECTOMY    . WISDOM TOOTH EXTRACTION      Prior to Admission medications   Medication Sig Start Date End Date Taking? Authorizing Provider  citalopram (CELEXA) 20 MG tablet Take 20 mg by mouth daily. 08/24/15  Yes [provider]  ferrous sulfate 325 (65 FE) MG tablet Take 325 mg by mouth daily with breakfast.   Yes [provider]  levothyroxine (SYNTHROID, LEVOTHROID) 25 MCG tablet Take 3  tablets (75 mcg total) by mouth daily before breakfast. Patient taking differently: Take 25 mcg by mouth daily before breakfast.  03/24/17  Yes Rexene Agent, CNM  levothyroxine (SYNTHROID, LEVOTHROID) 50 MCG tablet TAKE 1 TABLET BY MOUTH EVERY DAY IN THE MORNING with 25mg  03/02/17  Yes [provider]  LORazepam (ATIVAN) 2 MG tablet Take 0.5 mg by mouth 3 (three) times daily as needed.  05/17/16  Yes [provider]  omeprazole (PRILOSEC) 40 MG capsule Take 20 mg by mouth daily.   Yes [provider]  Prenatal Vit-Fe Fumarate-FA (MULTIVITAMIN-PRENATAL) 27-0.8 MG TABS tablet Take 1 tablet by mouth daily at 12 noon.   Yes [provider]  promethazine (PHENERGAN) 25 MG tablet Take 1 tablet (25 mg total) by mouth every 8 (eight) hours as needed for nausea or vomiting. 02/20/17   Rod Can, CNM    Allergies  Allergen Reactions  . Benzonatate Diarrhea  . Metronidazole Other (See Comments)    Body aches, all side effects     Family History  Problem Relation Age of Onset  . Diabetes Mother   . Hyperlipidemia Maternal Grandmother   . Hypertension Maternal Grandmother   . Breast cancer Maternal Grandmother   . Thyroid disease Maternal Grandmother     Social History Social History   Tobacco Use  . Smoking status: Never Smoker  . Smokeless tobacco: Never Used  Substance Use Topics  . Alcohol use: No  . Drug use: No    Review of Systems  Constitutional: Negative for fever. Eyes:  Negative for visual changes. ENT: Negative for sore throat. Cardiovascular: Negative for chest pain. Respiratory: Negative for shortness of breath. Gastrointestinal: Negative for abdominal pain, vomiting and diarrhea. Genitourinary: Negative for dysuria. Musculoskeletal: Left low back pain as per HPI. Skin: Negative for rash. Neurological: Negative for headache.  ____________________________________________   PHYSICAL EXAM:  VITAL SIGNS: ED Triage Vitals   Enc Vitals Group     BP 04/18/17 1447 126/82     Pulse Rate 04/18/17 1447 90     Resp 04/18/17 1447 18     Temp 04/18/17 1447 98.2 F (36.8 C)     Temp Source 04/18/17 1447 Oral     SpO2 04/18/17 1447 100 %     Weight 04/18/17 1448 208 lb (94.3 kg)     Height --      Head Circumference --      Peak Flow --      Pain Score 04/18/17 1447 4     Pain Loc --      Pain Edu? --      Excl. in [place]? --      Constitutional: Alert and oriented. Well appearing and in no distress. HEENT   Head: Normocephalic and atraumatic.      Eyes: Conjunctivae are normal. Pupils equal and round.       Ears:         Nose: No congestion/rhinnorhea.   Mouth/Throat: Mucous membranes are moist.   Neck: No stridor. Cardiovascular/Chest: Normal rate, regular rhythm.  No murmurs, rubs, or gallops. Respiratory: Normal respiratory effort without tachypnea nor retractions. Breath sounds are clear and equal bilaterally. No wheezes/rales/rhonchi. Gastrointestinal: Soft. No distention, no guarding, no rebound. Nontender.    Genitourinary/rectal:Deferred Musculoskeletal: Nontender with normal range of motion in all extremities. No joint effusions.  No lower extremity tenderness.  No edema. Neurologic:  Normal speech and language. No gross or focal neurologic deficits are appreciated. Skin:  Skin is warm, dry and intact. No rash noted. Psychiatric: Mood and affect are normal. Speech and behavior are normal. Patient exhibits appropriate insight and judgment.   ____________________________________________  LABS (pertinent positives/negatives) I, Lisa Roca, MD the attending physician have reviewed the labs noted below.  Labs Reviewed - No data to display  ____________________________________________    EKG I, Lisa Roca, MD, the attending physician have personally viewed and interpreted all ECGs.  None ____________________________________________  RADIOLOGY All Xrays were viewed by  me.  Imaging interpreted by Radiologist, and I, Lisa Roca, MD the attending physician have reviewed the radiologist interpretation noted below.  None __________________________________________  PROCEDURES  Procedure(s) performed: None  Critical Care performed: None   ____________________________________________  ED COURSE / ASSESSMENT AND PLAN  Pertinent labs & imaging results that were available during my care of the patient were reviewed by me and considered in my medical decision making (see chart for details).    Patient overall well-appearing, clinically symptoms consistent with sciatica.  She is not having urinary [place] symptoms or kidney stone symptoms.  No abdominal pain.  She reported some mild vaginal discharge without additional associated symptoms of pain or fevers, and we discussed holding off on pelvic exam at this point in time.  In terms of traumatic injury, she did have a slip on ice 2 weeks ago, however it does not sound like she struck her spine is not concerned about fracture and work on hold off on any imaging at this point time.  Sounds like she is mainly here because over the phone the OB  nurse told her she need to be seen at the emergency room.  No indications of spinal emergency such as cauda equina.  We discussed symptomatic treatment with Tylenol, heating pad locally on to the area, and possibly PT as well as limited physical activities that are irritating it.    CONSULTATIONS: None   Patient / Family / Caregiver informed of clinical course, medical decision-making process, and agree with plan.   I discussed return precautions, follow-up instructions, and discharge instructions with patient and/or family.  Discharge Instructions : You are evaluated for left lower back pain down the leg and this is consistent with sciatica.  Return to the emergency department immediately for any weakness or numbness, Incontinence of urine or stool, abdominal pain,  vaginal discharge or bleeding, or fevers.  You may take over the counter tylenol 60,g every 4 hours as needed for pain. We discussed limit bending, lifting and stairs as needed due to pain.  You may discuss Physical Therapy referral with your ob gyn if this persists.    ___________________________________________   FINAL CLINICAL IMPRESSION(S) / ED DIAGNOSES   Final diagnoses:  Sciatica, left side      ___________________________________________        Note: This dictation was prepared with Dragon dictation. Any transcriptional errors that result from this process are unintentional    Lisa Roca, MD 04/18/17 [phone]

## 2017-04-18 NOTE — ED Notes (Signed)
Reviewed discharge instructions, follow-up care, and OTC pain relievers with patient. Patient verbalized understanding of all information reviewed. Patient stable, with no distress noted at this time.    

## 2017-04-18 NOTE — ED Triage Notes (Signed)
Pt to ED c/o Lower back pain that radiates into left leg. Pt states that the pain started this morning. Pt also c/o vaginal discharge x 2-3 days, pt states that the discharge is white, pt denies itching or odor. Pt is [redacted] weeks pregnant. Denies abdominal pain.

## 2017-04-18 NOTE — ED Notes (Signed)
Pt presents today for lower back pain and vaginal discharge. Pt states it started this morning and that it radiates down the left leg. Pt is NAD and VS WNL.

## 2017-04-18 NOTE — Discharge Instructions (Signed)
You are evaluated for left lower back pain down the leg and this is consistent with sciatica.  Return to the emergency department immediately for any weakness or numbness, Incontinence of urine or stool, abdominal pain, vaginal discharge or bleeding, or fevers.  You may take over the counter tylenol 60,g every 4 hours as needed for pain. We discussed limit bending, lifting and stairs as needed due to pain.  You may discuss Physical Therapy referral with your ob gyn if this persists.

## 2017-04-27 ENCOUNTER — Ambulatory Visit (INDEPENDENT_AMBULATORY_CARE_PROVIDER_SITE_OTHER): Payer: BLUE CROSS/BLUE SHIELD

## 2017-04-27 ENCOUNTER — Ambulatory Visit (INDEPENDENT_AMBULATORY_CARE_PROVIDER_SITE_OTHER): Payer: BLUE CROSS/BLUE SHIELD | Admitting: Obstetrics & Gynecology

## 2017-04-27 VITALS — BP 130/80 | Wt 215.0 lb

## 2017-04-27 DIAGNOSIS — Z362 Encounter for other antenatal screening follow-up: Secondary | ICD-10-CM

## 2017-04-27 DIAGNOSIS — Z3A22 22 weeks gestation of pregnancy: Secondary | ICD-10-CM

## 2017-04-27 DIAGNOSIS — O0990 Supervision of high risk pregnancy, unspecified, unspecified trimester: Secondary | ICD-10-CM | POA: Diagnosis not present

## 2017-04-27 DIAGNOSIS — O99280 Endocrine, nutritional and metabolic diseases complicating pregnancy, unspecified trimester: Secondary | ICD-10-CM

## 2017-04-27 DIAGNOSIS — E039 Hypothyroidism, unspecified: Secondary | ICD-10-CM

## 2017-04-27 DIAGNOSIS — O99210 Obesity complicating pregnancy, unspecified trimester: Secondary | ICD-10-CM

## 2017-04-27 DIAGNOSIS — Z8619 Personal history of other infectious and parasitic diseases: Secondary | ICD-10-CM

## 2017-04-27 NOTE — Progress Notes (Signed)
Review of ULTRASOUND.    I have personally reviewed images and report of recent ultrasound done at [facility].    Plan of management to be discussed with patient.  PNV.  Labs today (TSH, T4, HSV IgG- if pos Valtrex 36 weeks)  Barnett Applebaum, MD, [place], [facility] 04/27/2017  4:54 PM

## 2017-04-28 LAB — TSH: TSH: 1.56 u[IU]/mL (ref 0.450–4.500)

## 2017-04-28 LAB — T4, FREE: Free T4: 1.18 ng/dL (ref 0.82–1.77)

## 2017-04-28 LAB — HSV(HERPES SIMPLEX VRS) I + II AB-IGG
HSV 1 Glycoprotein G Ab, IgG: 28.1 index — ABNORMAL HIGH (ref 0.00–0.90)
HSV 2 IGG, TYPE SPEC: 12.5 {index} — AB (ref 0.00–0.90)

## 2017-04-28 NOTE — Progress Notes (Signed)
NA. No message left.  Will call back later to discuss.

## 2017-04-30 NOTE — Progress Notes (Signed)
LM

## 2017-05-07 NOTE — Progress Notes (Signed)
LM as to results on her cell # Also discuss at next visit 05/26/17

## 2017-05-26 ENCOUNTER — Ambulatory Visit (INDEPENDENT_AMBULATORY_CARE_PROVIDER_SITE_OTHER): Payer: BLUE CROSS/BLUE SHIELD | Admitting: Obstetrics & Gynecology

## 2017-05-26 VITALS — BP 120/70 | Wt 217.0 lb

## 2017-05-26 DIAGNOSIS — O219 Vomiting of pregnancy, unspecified: Secondary | ICD-10-CM

## 2017-05-26 DIAGNOSIS — O99280 Endocrine, nutritional and metabolic diseases complicating pregnancy, unspecified trimester: Secondary | ICD-10-CM

## 2017-05-26 DIAGNOSIS — O99210 Obesity complicating pregnancy, unspecified trimester: Secondary | ICD-10-CM

## 2017-05-26 DIAGNOSIS — E039 Hypothyroidism, unspecified: Secondary | ICD-10-CM

## 2017-05-26 DIAGNOSIS — Z3A26 26 weeks gestation of pregnancy: Secondary | ICD-10-CM

## 2017-05-26 DIAGNOSIS — O0990 Supervision of high risk pregnancy, unspecified, unspecified trimester: Secondary | ICD-10-CM

## 2017-05-26 MED ORDER — PROMETHAZINE HCL 25 MG PO TABS: 25.0000 mg | ORAL_TABLET | Freq: Three times a day (TID) | ORAL | 2 refills | Status: DC | PRN

## 2017-05-26 MED ORDER — SERTRALINE HCL 50 MG PO TABS: 50.0000 mg | ORAL_TABLET | Freq: Every day | ORAL | 5 refills | Status: DC

## 2017-05-26 MED ORDER — LEVOTHYROXINE SODIUM 75 MCG PO TABS
75.0000 ug | ORAL_TABLET | Freq: Every day | ORAL | 6 refills | Status: DC
Start: 2017-05-26 — End: 2017-11-07

## 2017-05-26 NOTE — Progress Notes (Signed)
  Subjective  Fetal Movement? yes Contractions? no Leaking Fluid? no Vaginal Bleeding? no Labs discussed Objective  BP 120/70   Wt 217 lb (98.4 kg)   LMP 11/23/2016 (Approximate)   BMI 38.44 kg/m  General: NAD Pumonary: no increased work of breathing Abdomen: gravid, non-tender Extremities: no edema Psychiatric: mood appropriate, affect full  Assessment  37 y.o. 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

## 2017-05-26 NOTE — Patient Instructions (Addendum)
Braxton Hicks Contractions °Contractions of the uterus can occur throughout pregnancy, but they are not always a sign that you are in labor. You may have practice contractions called Braxton Hicks contractions. These false labor contractions are sometimes confused with true labor. °What are Braxton Hicks contractions? °Braxton Hicks contractions are tightening movements that occur in the muscles of the uterus before labor. Unlike true labor contractions, these contractions do not result in opening (dilation) and thinning of the cervix. Toward the end of pregnancy (32-34 weeks), Braxton Hicks contractions can happen more often and may become stronger. These contractions are sometimes difficult to tell apart from true labor because they can be very uncomfortable. You should not feel embarrassed if you go to the hospital with false labor. °Sometimes, the only way to tell if you are in true labor is for your health care provider to look for changes in the cervix. The health care provider will do a physical exam and may monitor your contractions. If you are not in true labor, the exam should show that your cervix is not dilating and your water has not broken. °If there are other health problems associated with your pregnancy, it is completely safe for you to be sent home with false labor. You may continue to have Braxton Hicks contractions until you go into true labor. °How to tell the difference between true labor and false labor °True labor °· Contractions last 30-70 seconds. °· Contractions become very regular. °· Discomfort is usually felt in the top of the uterus, and it spreads to the lower abdomen and low back. °· Contractions do not go away with walking. °· Contractions usually become more intense and increase in frequency. °· The cervix dilates and gets thinner. °False labor °· Contractions are usually shorter and not as strong as true labor contractions. °· Contractions are usually irregular. °· Contractions  are often felt in the front of the lower abdomen and in the groin. °· Contractions may go away when you walk around or change positions while lying down. °· Contractions get weaker and are shorter-lasting as time goes on. °· The cervix usually does not dilate or become thin. °Follow these instructions at home: °· Take over-the-counter and prescription medicines only as told by your health care provider. °· Keep up with your usual exercises and follow other instructions from your health care provider. °· Eat and drink lightly if you think you are going into labor. °· If Braxton Hicks contractions are making you uncomfortable: °? Change your position from lying down or resting to walking, or change from walking to resting. °? Sit and rest in a tub of warm water. °? Drink enough fluid to keep your urine pale yellow. Dehydration may cause these contractions. °? Do slow and deep breathing several times an hour. °· Keep all follow-up prenatal visits as told by your health care provider. This is important. °Contact a health care provider if: °· You have a fever. °· You have continuous pain in your abdomen. °Get help right away if: °· Your contractions become stronger, more regular, and closer together. °· You have fluid leaking or gushing from your vagina. °· You pass blood-tinged mucus (bloody show). °· You have bleeding from your vagina. °· You have low back pain that you never had before. °· You feel your baby’s head pushing down and causing pelvic pressure. °· Your baby is not moving inside you as much as it used to. °Summary °· Contractions that occur before labor are called Braxton   Hicks contractions, false labor, or practice contractions. °· Braxton Hicks contractions are usually shorter, weaker, farther apart, and less regular than true labor contractions. True labor contractions usually become progressively stronger and regular and they become more frequent. °· Manage discomfort from Braxton Hicks contractions by  changing position, resting in a warm bath, drinking plenty of water, or practicing deep breathing. °This information is not intended to replace advice given to you by your health care provider. Make sure you discuss any questions you have with your health care provider. °Document Released: 08/28/2016 Document Revised: 08/28/2016 Document Reviewed: 08/28/2016 °Elsevier Interactive Patient Education © 2018 [employer] ° °

## 2017-06-01 ENCOUNTER — Observation Stay
Admission: EM | Admit: 2017-06-01 | Discharge: 2017-06-01 | Disposition: A | Discharge status: Home / Self Care | Payer: BLUE CROSS/BLUE SHIELD | Attending: Obstetrics & Gynecology | Admitting: Obstetrics & Gynecology

## 2017-06-01 ENCOUNTER — Encounter: Payer: Self-pay | Admitting: Maternal Newborn

## 2017-06-01 ENCOUNTER — Other Ambulatory Visit: Payer: Self-pay

## 2017-06-01 DIAGNOSIS — O0990 Supervision of high risk pregnancy, unspecified, unspecified trimester: Secondary | ICD-10-CM

## 2017-06-01 DIAGNOSIS — Z3A27 27 weeks gestation of pregnancy: Secondary | ICD-10-CM | POA: Diagnosis not present

## 2017-06-01 DIAGNOSIS — Z79899 Other long term (current) drug therapy: Secondary | ICD-10-CM | POA: Diagnosis not present

## 2017-06-01 DIAGNOSIS — O26893 Other specified pregnancy related conditions, third trimester: Secondary | ICD-10-CM | POA: Diagnosis not present

## 2017-06-01 DIAGNOSIS — A084 Viral intestinal infection, unspecified: Secondary | ICD-10-CM | POA: Insufficient documentation

## 2017-06-01 DIAGNOSIS — O99612 Diseases of the digestive system complicating pregnancy, second trimester: Principal | ICD-10-CM | POA: Insufficient documentation

## 2017-06-01 DIAGNOSIS — R1110 Vomiting, unspecified: Secondary | ICD-10-CM | POA: Diagnosis present

## 2017-06-01 DIAGNOSIS — O99342 Other mental disorders complicating pregnancy, second trimester: Secondary | ICD-10-CM | POA: Diagnosis not present

## 2017-06-01 DIAGNOSIS — R112 Nausea with vomiting, unspecified: Secondary | ICD-10-CM | POA: Diagnosis not present

## 2017-06-01 DIAGNOSIS — R197 Diarrhea, unspecified: Secondary | ICD-10-CM

## 2017-06-01 DIAGNOSIS — O212 Late vomiting of pregnancy: Secondary | ICD-10-CM | POA: Diagnosis present

## 2017-06-01 DIAGNOSIS — F419 Anxiety disorder, unspecified: Secondary | ICD-10-CM | POA: Diagnosis not present

## 2017-06-01 LAB — COMPREHENSIVE METABOLIC PANEL
ALT: 18 U/L (ref 14–54)
AST: 24 U/L (ref 15–41)
Albumin: 3.4 g/dL — ABNORMAL LOW (ref 3.5–5.0)
Alkaline Phosphatase: 61 U/L (ref 38–126)
Anion gap: 13 (ref 5–15)
BUN: 9 mg/dL (ref 6–20)
CHLORIDE: 104 mmol/L (ref 101–111)
CO2: 20 mmol/L — AB (ref 22–32)
Calcium: 9.3 mg/dL (ref 8.9–10.3)
Creatinine, Ser: 0.6 mg/dL (ref 0.44–1.00)
Glucose, Bld: 116 mg/dL — ABNORMAL HIGH (ref 65–99)
POTASSIUM: 3.8 mmol/L (ref 3.5–5.1)
SODIUM: 137 mmol/L (ref 135–145)
Total Bilirubin: 0.2 mg/dL — ABNORMAL LOW (ref 0.3–1.2)
Total Protein: 7.3 g/dL (ref 6.5–8.1)

## 2017-06-01 LAB — CBC
HCT: 36.3 % (ref 35.0–47.0)
Hemoglobin: 12.2 g/dL (ref 12.0–16.0)
MCH: 28.6 pg (ref 26.0–34.0)
MCHC: 33.6 g/dL (ref 32.0–36.0)
MCV: 85 fL (ref 80.0–100.0)
PLATELETS: 324 10*3/uL (ref 150–440)
RBC: 4.27 MIL/uL (ref 3.80–5.20)
RDW: 14.4 % (ref 11.5–14.5)
WBC: 16.6 10*3/uL — AB (ref 3.6–11.0)

## 2017-06-01 MED ORDER — PROMETHAZINE HCL 25 MG/ML IJ SOLN
25.0000 mg | Freq: Four times a day (QID) | INTRAMUSCULAR | Status: DC | PRN
  Administered 2017-06-01: 25 mg via INTRAVENOUS
  Filled 2017-06-01: qty 1

## 2017-06-01 MED ORDER — ONDANSETRON HCL 4 MG PO TABS: 4.0000 mg | ORAL_TABLET | Freq: Three times a day (TID) | ORAL | 1 refills | Status: DC | PRN

## 2017-06-01 MED ORDER — LACTATED RINGERS IV SOLN
INTRAVENOUS | Status: DC
  Administered 2017-06-01 (×3): via INTRAVENOUS

## 2017-06-01 MED ORDER — ONDANSETRON HCL 4 MG/2ML IJ SOLN
4.0000 mg | Freq: Four times a day (QID) | INTRAMUSCULAR | Status: DC
  Administered 2017-06-01: 4 mg via INTRAVENOUS
  Filled 2017-06-01: qty 2

## 2017-06-01 MED ORDER — LOPERAMIDE HCL 2 MG PO CAPS
4.0000 mg | ORAL_CAPSULE | Freq: Once | ORAL | Status: AC
  Administered 2017-06-01: 4 mg via ORAL
  Filled 2017-06-01 (×2): qty 2

## 2017-06-01 MED ORDER — LACTATED RINGERS IV BOLUS (SEPSIS)
1000.0000 mL | Freq: Once | INTRAVENOUS | Status: AC
  Administered 2017-06-01: 1000 mL via INTRAVENOUS

## 2017-06-01 MED ORDER — METOCLOPRAMIDE HCL 5 MG/ML IJ SOLN
10.0000 mg | Freq: Once | INTRAMUSCULAR | Status: AC
  Administered 2017-06-01: 10 mg via INTRAVENOUS
  Filled 2017-06-01: qty 2

## 2017-06-01 MED ORDER — SODIUM CHLORIDE 0.9 % IJ SOLN
INTRAMUSCULAR | Status: AC
  Administered 2017-06-01: 10 mL
  Filled 2017-06-01: qty 10

## 2017-06-01 MED ORDER — LOPERAMIDE HCL 2 MG PO CAPS
2.0000 mg | ORAL_CAPSULE | ORAL | Status: DC | PRN
  Administered 2017-06-01: 2 mg via ORAL
  Filled 2017-06-01 (×2): qty 1

## 2017-06-01 MED ORDER — ONDANSETRON HCL 4 MG/2ML IJ SOLN
4.0000 mg | Freq: Once | INTRAMUSCULAR | Status: AC
  Administered 2017-06-01: 4 mg via INTRAVENOUS

## 2017-06-01 MED ORDER — ONDANSETRON HCL 4 MG/2ML IJ SOLN
4.0000 mg | Freq: Four times a day (QID) | INTRAMUSCULAR | Status: DC
  Administered 2017-06-01: 4 mg via INTRAVENOUS
  Filled 2017-06-01 (×2): qty 2

## 2017-06-01 NOTE — Discharge Instructions (Signed)

## 2017-06-01 NOTE — Discharge Summary (Signed)
See Final Progress Note 06/01/2017.  Avel Sensor, CNM 06/01/2017  5:29 PM

## 2017-06-01 NOTE — OB Triage Note (Signed)
Patient arrived in triage with c/o n/v/d x4 hours. Reports being around another child at home that had similar symptoms yesterday. Reports decreased fetal movement, but still feeling baby move some.  Denies leaking of fluid and vaginal bleeding. Uncertain of contractions, feels mostly "reflux" type pain in mid abdomen, some "cramping".  EFM applied and assessing.

## 2017-06-01 NOTE — OB Triage Note (Signed)
Pt resting in bed quietly. Reports feeling "better". Has had "a little bit of nausea but no vomiting" since receiving fluids and zofran.

## 2017-06-01 NOTE — Final Progress Note (Signed)
Physician Final Progress Note  Patient ID: Jenny Hendrix MRN: [id number] DOB/AGE: Oct 29, 1980 37 y.o.  Admit date: 06/01/2017 Admitting provider: Homero Fellers, MD Discharge date: 06/01/2017   Admission Diagnoses: Nausea, vomiting, diarrhea; decreased fetal movement  Discharge Diagnoses:  Viral gastroenteritis  History of Present Illness: The patient is a 37 y.o. female G2P0010 at [redacted]w[redacted]d who presented to triage after experiencing 4 hours of nausea, vomiting, and diarrhea with cramping pains at home. She also reported decreased fetal movement. Denies vaginal bleeding and loss of fluid.   10 point review of systems negative unless otherwise noted in HPI.  Past Medical History:  Diagnosis Date  . Anxiety   . ASCUS of cervix with negative high risk HPV 05/22/2016    Past Surgical History:  Procedure Laterality Date  . DILATION AND EVACUATION N/A 06/19/2016   Procedure: DILATATION AND EVACUATION;  Surgeon: Gae Dry, MD;  Location: [facility] ORS;  Service: Gynecology;  Laterality: N/A;  . TONSILLECTOMY AND ADENOIDECTOMY    . WISDOM TOOTH EXTRACTION      No current facility-administered medications on file prior to encounter.    Current Outpatient Medications on File Prior to Encounter  Medication Sig Dispense Refill  . ferrous sulfate 325 (65 FE) MG tablet Take 325 mg by mouth daily with breakfast.    . levothyroxine (SYNTHROID) 75 MCG tablet Take 1 tablet (75 mcg total) by mouth daily before breakfast. 30 tablet 6  . LORazepam (ATIVAN) 2 MG tablet Take 0.5 mg by mouth 3 (three) times daily as needed.   2  . omeprazole (PRILOSEC) 40 MG capsule Take 20 mg by mouth daily.    . Prenatal Vit-Fe Fumarate-FA (MULTIVITAMIN-PRENATAL) 27-0.8 MG TABS tablet Take 1 tablet by mouth daily at 12 noon.    . promethazine (PHENERGAN) 25 MG tablet Take 1 tablet (25 mg total) by mouth every 8 (eight) hours as needed for nausea or vomiting. 30 tablet 2  . sertraline (ZOLOFT) 50 MG tablet Take  1 tablet (50 mg total) by mouth daily. 30 tablet 5    Allergies  Allergen Reactions  . Benzonatate Diarrhea  . Metronidazole Other (See Comments)    Body aches, all side effects     Social History   Socioeconomic History  . Marital status: Married    Spouse name: Not on file  . Number of children: Not on file  . Years of education: Not on file  . Highest education level: Not on file  Social Needs  . Financial resource strain: Not on file  . Food insecurity - worry: Not on file  . Food insecurity - inability: Not on file  . Transportation needs - medical: Not on file  . Transportation needs - non-medical: Not on file  Occupational History  . Not on file  Tobacco Use  . Smoking status: Never Smoker  . Smokeless tobacco: Never Used  Substance and Sexual Activity  . Alcohol use: No  . Drug use: No  . Sexual activity: Yes    Birth control/protection: None  Other Topics Concern  . Not on file  Social History Narrative  . Not on file    Physical Exam: BP 125/76   Pulse (!) 102   Temp 98.3 F (36.8 C) (Oral)   Resp 16   Ht 5\' 3"  (1.6 m)   Wt 213 lb (96.6 kg)   LMP 11/23/2016 (Approximate)   BMI 37.73 kg/m   Gen: NAD CV: RRR Pulm: no increased work of breathing Pelvic: deferred  Ext: no signs of DVT  NST: Baseline 145, moderate variability, accelerations present, decelerations absent, monitored for 40+ minutes, toco: none. Reactive NST.  Consults: None  Significant Findings/ Diagnostic Studies: labs: normal electrolytes and CBC  Procedures: NST, reactive  Discharge Condition: stable  Disposition: 01-Home or Self Care  Diet: Regular diet and Encourage fluids  Discharge Activity: Activity as tolerated  Discharge Instructions    Discharge activity:  No Restrictions   Complete by:  As directed    Discharge diet:  No restrictions   Complete by:  As directed    No sexual activity restrictions   Complete by:  As directed    Notify physician for a general  feeling that "something is not right"   Complete by:  As directed    Notify physician for increase or change in vaginal discharge   Complete by:  As directed    Notify physician for intestinal cramps, with or without diarrhea, sometimes described as "gas pain"   Complete by:  As directed    Notify physician for leaking of fluid   Complete by:  As directed    Notify physician for low, dull backache, unrelieved by heat or Tylenol   Complete by:  As directed    Notify physician for menstrual like cramps   Complete by:  As directed    Notify physician for pelvic pressure   Complete by:  As directed    Notify physician for uterine contractions.  These may be painless and feel like the uterus is tightening or the baby is  "balling up"   Complete by:  As directed    Notify physician for vaginal bleeding   Complete by:  As directed    PRETERM LABOR:  Includes any of the follwing symptoms that occur between 20 - [redacted] weeks gestation.  If these symptoms are not stopped, preterm labor can result in preterm delivery, placing your baby at risk   Complete by:  As directed      Allergies as of 06/01/2017      Reactions   Benzonatate Diarrhea   Metronidazole Other (See Comments)   Body aches, all side effects       Medication List    TAKE these medications   ferrous sulfate 325 (65 FE) MG tablet Take 325 mg by mouth daily with breakfast.   levothyroxine 75 MCG tablet Commonly known as:  SYNTHROID Take 1 tablet (75 mcg total) by mouth daily before breakfast.   LORazepam 2 MG tablet Commonly known as:  ATIVAN Take 0.5 mg by mouth 3 (three) times daily as needed.   multivitamin-prenatal 27-0.8 MG Tabs tablet Take 1 tablet by mouth daily at 12 noon.   omeprazole 40 MG capsule Commonly known as:  PRILOSEC Take 20 mg by mouth daily.   ondansetron 4 MG tablet Commonly known as:  ZOFRAN Take 1 tablet (4 mg total) by mouth every 8 (eight) hours as needed for nausea or vomiting.    promethazine 25 MG tablet Commonly known as:  PHENERGAN Take 1 tablet (25 mg total) by mouth every 8 (eight) hours as needed for nausea or vomiting.   sertraline 50 MG tablet Commonly known as:  ZOLOFT Take 1 tablet (50 mg total) by mouth daily.      Patient tolerating PO. N/V/D has ceased and she is feeling better. Advised rest, fluids, and advancing diet slowly.  Total time spent taking care of this patient: 15 minutes  Signed: Rexene Agent, CNM  06/01/2017, 5:20  PM

## 2017-06-01 NOTE — Progress Notes (Signed)
Pt reports being able to keep down crackers and ginger ale, states she wants to go home and is feeling much better.

## 2017-06-01 NOTE — OB Triage Provider Note (Signed)
Triage Visit  Jenny Hendrix is an 37 y.o. female G2P0010 at 27 weeks 1 day gestation. HPI: She presents today through the ER for nausea, vomiting, and diarrhea. She says her nausea and vomiting began at 8:30 pm. She was in the bathroom for over 2 hours. She was recently around her stepson who has viral gastroenteritis. She denies vaginal bleeding. Denies leakage of fluid. Positive fetal movement.   FHR: 140s, moderate variability, 10x10 acceleraions, variable decelerations Toco quiet  Past Medical History:  Diagnosis Date  . Anxiety   . ASCUS of cervix with negative high risk HPV 05/22/2016    Past Surgical History:  Procedure Laterality Date  . DILATION AND EVACUATION N/A 06/19/2016   Procedure: DILATATION AND EVACUATION;  Surgeon: Gae Dry, MD;  Location: [facility] ORS;  Service: Gynecology;  Laterality: N/A;  . TONSILLECTOMY AND ADENOIDECTOMY    . WISDOM TOOTH EXTRACTION      Family History  Problem Relation Age of Onset  . Diabetes Mother   . Hyperlipidemia Maternal Grandmother   . Hypertension Maternal Grandmother   . Breast cancer Maternal Grandmother   . Thyroid disease Maternal Grandmother     Social History:  reports that  has never smoked. she has never used smokeless tobacco. She reports that she does not drink alcohol or use drugs.  Allergies:  Allergies  Allergen Reactions  . Benzonatate Diarrhea  . Metronidazole Other (See Comments)    Body aches, all side effects     Medications: I have reviewed the patient's current medications.  Results for orders placed or performed during the hospital encounter of 06/01/17 (from the past 48 hour(s))  CBC     Status: Abnormal   Collection Time: 06/01/17 12:55 AM  Result Value Ref Range   WBC 16.6 (H) 3.6 - 11.0 K/uL   RBC 4.27 3.80 - 5.20 MIL/uL   Hemoglobin 12.2 12.0 - 16.0 g/dL   HCT 36.3 35.0 - 47.0 %   MCV 85.0 80.0 - 100.0 fL   MCH 28.6 26.0 - 34.0 pg   MCHC 33.6 32.0 - 36.0 g/dL   RDW 14.4 11.5 - 14.5  %   Platelets 324 150 - 440 K/uL    Comment: Performed at [facility], [address] [place], [place] [address]  Comprehensive metabolic panel     Status: Abnormal   Collection Time: 06/01/17 12:55 AM  Result Value Ref Range   Sodium 137 135 - 145 mmol/L   Potassium 3.8 3.5 - 5.1 mmol/L   Chloride 104 101 - 111 mmol/L   CO2 20 (L) 22 - 32 mmol/L   Glucose, Bld 116 (H) 65 - 99 mg/dL   BUN 9 6 - 20 mg/dL   Creatinine, Ser 0.60 0.44 - 1.00 mg/dL   Calcium 9.3 8.9 - 10.3 mg/dL   Total Protein 7.3 6.5 - 8.1 g/dL   Albumin 3.4 (L) 3.5 - 5.0 g/dL   AST 24 15 - 41 U/L   ALT 18 14 - 54 U/L   Alkaline Phosphatase 61 38 - 126 U/L   Total Bilirubin 0.2 (L) 0.3 - 1.2 mg/dL   GFR calc non Af Amer >60 >60 mL/min   GFR calc Af Amer >60 >60 mL/min    Comment: (NOTE) The eGFR has been calculated using the CKD EPI equation. This calculation has not been validated in all clinical situations. eGFR's persistently <60 mL/min signify possible Chronic Kidney Disease.    Anion gap 13 5 - 15  Comment: Performed at [facility], [address] [place], [place] [address]    No results found.  Review of Systems  Constitutional: Negative for chills, fever, malaise/fatigue and weight loss.  HENT: Negative for congestion, hearing loss and sinus pain.   Eyes: Negative for blurred vision and double vision.  Respiratory: Negative for cough, sputum production, shortness of breath and wheezing.   Cardiovascular: Negative for chest pain, palpitations, orthopnea and leg swelling.  Gastrointestinal: Positive for abdominal pain, diarrhea, nausea and vomiting. Negative for constipation.  Genitourinary: Negative for dysuria, flank pain, frequency, hematuria and urgency.  Musculoskeletal: Negative for back pain, falls and joint pain.  Skin: Negative for itching and rash.  Neurological: Negative for dizziness and headaches.  Psychiatric/Behavioral: Negative for depression, substance abuse  and suicidal ideas. The patient is not nervous/anxious.    Blood pressure 113/64, pulse (!) 103, temperature 98.3 F (36.8 C), temperature source Oral, height '5\' 3"'  (1.6 m), weight 213 lb (96.6 kg), last menstrual period 11/23/2016. Physical Exam  Nursing note and vitals reviewed. Constitutional: She is oriented to person, place, and time. She appears well-developed and well-nourished.  HENT:  Head: Normocephalic and atraumatic.  Cardiovascular: Normal rate and regular rhythm.  Respiratory: Effort normal and breath sounds normal.  GI: Soft. Bowel sounds are normal. She exhibits no distension. There is no tenderness.  Musculoskeletal: Normal range of motion.  Neurological: She is alert and oriented to person, place, and time.  Skin: Skin is warm and dry.  Psychiatric: She has a normal mood and affect. Her behavior is normal. Judgment and thought content normal.    Assessment/Plan: 36yo G2P0010 at 27 wks 1 day.  Viral gastroenteritis, supportive care with IV fluids, zofran PRN. Patient feeling better with zofran.   Jenny Hendrix 06/01/2017, 1:37 AM

## 2017-06-11 ENCOUNTER — Ambulatory Visit (INDEPENDENT_AMBULATORY_CARE_PROVIDER_SITE_OTHER): Payer: BLUE CROSS/BLUE SHIELD | Admitting: Obstetrics and Gynecology

## 2017-06-11 ENCOUNTER — Other Ambulatory Visit: Payer: BLUE CROSS/BLUE SHIELD

## 2017-06-11 ENCOUNTER — Encounter: Payer: Self-pay | Admitting: Obstetrics and Gynecology

## 2017-06-11 VITALS — BP 100/62 | Wt 215.0 lb

## 2017-06-11 DIAGNOSIS — Z3A28 28 weeks gestation of pregnancy: Secondary | ICD-10-CM

## 2017-06-11 DIAGNOSIS — O0990 Supervision of high risk pregnancy, unspecified, unspecified trimester: Secondary | ICD-10-CM

## 2017-06-11 DIAGNOSIS — E039 Hypothyroidism, unspecified: Secondary | ICD-10-CM

## 2017-06-11 NOTE — Progress Notes (Signed)
    Routine Prenatal Care Visit  Subjective  Jenny Hendrix is a 37 y.o. G2P0010 at [redacted]w[redacted]d being seen today for ongoing prenatal care.  She is currently monitored for the following issues for this high-risk pregnancy and has Pelvic pain in female; Anxiety and depression; Supervision of high risk pregnancy, antepartum; Obesity in pregnancy; GERD (gastroesophageal reflux disease); Hypothyroidism affecting pregnancy, antepartum; and Vomiting and diarrhea on their problem list.  ----------------------------------------------------------------------------------- Patient reports she is on an antibiotic for an ear infection. GTT today. Contractions: Not present. Vag. Bleeding: None.  Movement: Present. Denies leaking of fluid.  ----------------------------------------------------------------------------------- The following portions of the patient's history were reviewed and updated as appropriate: allergies, current medications, past family history, past medical history, past social history, past surgical history and problem list. Problem list updated.   Objective  Blood pressure 100/62, weight 215 lb (97.5 kg), last menstrual period 11/23/2016. Pregravid weight 212 lb (96.2 kg) Total Weight Gain 3 lb (1.361 kg) Urinalysis: Urine Protein: 1+ Urine Glucose: Negative  Fetal Status: Fetal Heart Rate (bpm): 137 Fundal Height: 38 cm Movement: Present     General:  Alert, oriented and cooperative. Patient is in no acute distress.  Skin: Skin is warm and dry. No rash noted.   Cardiovascular: Normal heart rate noted  Respiratory: Normal respiratory effort, no problems with respiration noted  Abdomen: Soft, gravid, appropriate for gestational age. Pain/Pressure: Absent     Pelvic:  Cervical exam deferred        Extremities: Normal range of motion.     ental Status: Normal mood and affect. Normal behavior. Normal judgment and thought content.     Assessment   37 y.o. G2P0010 at [redacted]w[redacted]d by  08/30/2017,  by Last Menstrual Period presenting for routine prenatal visit  Plan   pregnany 2  Problems (from 11/23/16 to present)    Problem Noted Resolved   Supervision of high risk pregnancy, antepartum 01/27/2017 by Dalia Heading, CNM No   Overview Addendum 06/11/2017 10:52 AM by Homero Fellers, MD       [facility] Prenatal Labs  Dating [place] Blood type: A/--/-- (09/18 1423)   Genetic Screen 1 Screen:  Negative      Antibody:Negative (09/18 1423)  Anatomic [place]  WS Rubella: 6.72 (09/18 1423) Varicella: Immune  GTT Early: negative       28 wk:      RPR: Non Reactive (09/18 1423)   Rhogam  Not needed HBsAg: Negative (09/18 1423)   TDaP vaccine                       HIV: Non Reactive (09/18 1423)   Flu Shot  Received at work GBS:   Contraception  Mirena IUD Pap: 12/2016 NIL HPV negative  CBB   given Info   CS/VBAC  Not applicable   Baby Food  Breast   Support Person                    Gestational age appropriate obstetric precautions including but not limited to vaginal bleeding, contractions, leaking of fluid and fetal movement were reviewed in detail with the patient.    Return in about 2 weeks (around 06/25/2017) for ROB.  28wk labs today TSH/FT4 today  Adrian Prows MD [place] 06/11/2017, 7:08 PM

## 2017-06-12 ENCOUNTER — Telehealth: Payer: Self-pay | Admitting: Obstetrics and Gynecology

## 2017-06-12 ENCOUNTER — Other Ambulatory Visit: Payer: Self-pay | Admitting: Obstetrics and Gynecology

## 2017-06-12 DIAGNOSIS — K5903 Drug induced constipation: Secondary | ICD-10-CM

## 2017-06-12 DIAGNOSIS — O99013 Anemia complicating pregnancy, third trimester: Secondary | ICD-10-CM

## 2017-06-12 DIAGNOSIS — R7309 Other abnormal glucose: Secondary | ICD-10-CM

## 2017-06-12 LAB — 28 WEEK RH+PANEL
BASOS ABS: 0 10*3/uL (ref 0.0–0.2)
Basos: 0 %
EOS (ABSOLUTE): 0.1 10*3/uL (ref 0.0–0.4)
EOS: 1 %
Gestational Diabetes Screen: 166 mg/dL — ABNORMAL HIGH (ref 65–139)
HIV SCREEN 4TH GENERATION: NONREACTIVE
Hematocrit: 32.5 % — ABNORMAL LOW (ref 34.0–46.6)
Hemoglobin: 10.5 g/dL — ABNORMAL LOW (ref 11.1–15.9)
IMMATURE GRANS (ABS): 0.1 10*3/uL (ref 0.0–0.1)
Immature Granulocytes: 1 %
LYMPHS: 11 %
Lymphocytes Absolute: 1.1 10*3/uL (ref 0.7–3.1)
MCH: 28 pg (ref 26.6–33.0)
MCHC: 32.3 g/dL (ref 31.5–35.7)
MCV: 87 fL (ref 79–97)
MONOCYTES: 4 %
Monocytes Absolute: 0.4 10*3/uL (ref 0.1–0.9)
NEUTROS ABS: 8.4 10*3/uL — AB (ref 1.4–7.0)
Neutrophils: 83 %
PLATELETS: 381 10*3/uL — AB (ref 150–379)
RBC: 3.75 x10E6/uL — ABNORMAL LOW (ref 3.77–5.28)
RDW: 14.6 % (ref 12.3–15.4)
RPR Ser Ql: NONREACTIVE
WBC: 10 10*3/uL (ref 3.4–10.8)

## 2017-06-12 LAB — TSH+FREE T4
FREE T4: 1.07 ng/dL (ref 0.82–1.77)
TSH: 1.27 u[IU]/mL (ref 0.450–4.500)

## 2017-06-12 MED ORDER — FERROUS SULFATE 325 (65 FE) MG PO TABS: 325.0000 mg | ORAL_TABLET | Freq: Two times a day (BID) | ORAL | 1 refills | Status: DC

## 2017-06-12 MED ORDER — DOCUSATE SODIUM 100 MG PO CAPS: 100.0000 mg | ORAL_CAPSULE | Freq: Two times a day (BID) | ORAL | 2 refills | Status: DC | PRN

## 2017-06-12 NOTE — Progress Notes (Signed)
Released to [place] called with results.

## 2017-06-12 NOTE — Telephone Encounter (Signed)
Pt aware. Transferred to front desk for scheduling.

## 2017-06-12 NOTE — Progress Notes (Signed)
3 hour GTT ordered Iron therapy BID and colace as needed for constipation

## 2017-06-12 NOTE — Telephone Encounter (Signed)
Please call patient with glucose results from yesterday.  cb [phone]

## 2017-06-12 NOTE — Telephone Encounter (Signed)
Patient will need a 3hour GTT. Please call and have her schedule this test thank you.

## 2017-06-12 NOTE — Telephone Encounter (Signed)
Elevated glucose result. Please advise pt. Thank you!

## 2017-06-15 ENCOUNTER — Other Ambulatory Visit: Payer: BLUE CROSS/BLUE SHIELD

## 2017-06-22 ENCOUNTER — Telehealth: Payer: Self-pay

## 2017-06-22 NOTE — Telephone Encounter (Signed)
Pt is scheduled for CPR training this pm.  Employer wanted her to call to be sure it was okay. [phone]  Adv okay to take part.  Pt aware if on floor may lose balance more easily.

## 2017-06-22 NOTE — Telephone Encounter (Signed)
Pt employer wants note stating okay for pt to participate in CPR Training. Fax# [phone] Pt's # [phone]  Left detailed msg that letter was faxed.

## 2017-06-24 ENCOUNTER — Other Ambulatory Visit: Payer: BLUE CROSS/BLUE SHIELD

## 2017-06-24 ENCOUNTER — Ambulatory Visit (INDEPENDENT_AMBULATORY_CARE_PROVIDER_SITE_OTHER): Payer: BLUE CROSS/BLUE SHIELD | Admitting: Obstetrics & Gynecology

## 2017-06-24 VITALS — BP 120/80 | Wt 214.0 lb

## 2017-06-24 DIAGNOSIS — Z3A30 30 weeks gestation of pregnancy: Secondary | ICD-10-CM

## 2017-06-24 DIAGNOSIS — Z23 Encounter for immunization: Secondary | ICD-10-CM | POA: Diagnosis not present

## 2017-06-24 DIAGNOSIS — O99280 Endocrine, nutritional and metabolic diseases complicating pregnancy, unspecified trimester: Secondary | ICD-10-CM

## 2017-06-24 DIAGNOSIS — O99810 Abnormal glucose complicating pregnancy: Secondary | ICD-10-CM

## 2017-06-24 DIAGNOSIS — O0990 Supervision of high risk pregnancy, unspecified, unspecified trimester: Secondary | ICD-10-CM

## 2017-06-24 DIAGNOSIS — E039 Hypothyroidism, unspecified: Secondary | ICD-10-CM

## 2017-06-24 NOTE — Progress Notes (Signed)
  Subjective  Fetal Movement? yes Contractions? no Leaking Fluid? no Vaginal Bleeding? no 3 hour GTT today Thy testing last visit normal, on meds Objective  BP 120/80   Wt 214 lb (97.1 kg)   LMP 11/23/2016 (Approximate)   BMI 37.91 kg/m  General: NAD Pumonary: no increased work of breathing Abdomen: gravid, non-tender Extremities: no edema Psychiatric: mood appropriate, affect full  Assessment  37 y.o. G2P0010 at [redacted]w[redacted]d by  08/30/2017, by Last Menstrual Period presenting for routine prenatal visit  Plan   Problem List Items Addressed This Visit      Endocrine   Hypothyroidism affecting pregnancy, antepartum   Relevant Orders   US OB Follow Up     Other   Supervision of high risk pregnancy, antepartum    Other Visit Diagnoses    [redacted] weeks gestation of pregnancy    -  Primary   Relevant Orders   US OB Follow Up    3 hour today Cont current dose thy meds [place] nv PNV, [facility]  Barnett Applebaum, MD, [facility], [facility] 06/24/2017  10:46 AM

## 2017-06-25 ENCOUNTER — Encounter: Payer: BLUE CROSS/BLUE SHIELD | Admitting: Maternal Newborn

## 2017-06-26 ENCOUNTER — Telehealth: Payer: Self-pay

## 2017-06-26 NOTE — Telephone Encounter (Signed)
Contacted pt to notify of 3hr GTT results. MD aware lab values called from [facility] 1 hr = 146 2 hr = 119 and 3 hr = 39. Advised pt she passed glucose test. Pt appreciative and aware results available on MyChart once signed off.

## 2017-06-27 LAB — GESTATIONAL GLUCOSE TOLERANCE
GLUCOSE 1 HOUR GTT: 146 mg/dL (ref 65–179)
GLUCOSE FASTING: 79 mg/dL (ref 65–94)
Glucose, GTT - 2 Hour: 119 mg/dL (ref 65–154)
Glucose, GTT - 3 Hour: 39 mg/dL — CL (ref 65–139)

## 2017-06-30 NOTE — Progress Notes (Signed)
Called, left message to check mychart. Diabetes screen negative.

## 2017-07-07 ENCOUNTER — Ambulatory Visit (INDEPENDENT_AMBULATORY_CARE_PROVIDER_SITE_OTHER): Payer: BLUE CROSS/BLUE SHIELD | Admitting: Obstetrics and Gynecology

## 2017-07-07 ENCOUNTER — Ambulatory Visit (INDEPENDENT_AMBULATORY_CARE_PROVIDER_SITE_OTHER): Payer: BLUE CROSS/BLUE SHIELD

## 2017-07-07 ENCOUNTER — Encounter: Payer: Self-pay | Admitting: Obstetrics and Gynecology

## 2017-07-07 VITALS — BP 120/76 | Wt 214.0 lb

## 2017-07-07 DIAGNOSIS — Z3A30 30 weeks gestation of pregnancy: Secondary | ICD-10-CM

## 2017-07-07 DIAGNOSIS — F329 Major depressive disorder, single episode, unspecified: Secondary | ICD-10-CM

## 2017-07-07 DIAGNOSIS — E039 Hypothyroidism, unspecified: Secondary | ICD-10-CM

## 2017-07-07 DIAGNOSIS — K219 Gastro-esophageal reflux disease without esophagitis: Secondary | ICD-10-CM

## 2017-07-07 DIAGNOSIS — Z3A32 32 weeks gestation of pregnancy: Secondary | ICD-10-CM

## 2017-07-07 DIAGNOSIS — F419 Anxiety disorder, unspecified: Secondary | ICD-10-CM

## 2017-07-07 DIAGNOSIS — O99280 Endocrine, nutritional and metabolic diseases complicating pregnancy, unspecified trimester: Secondary | ICD-10-CM

## 2017-07-07 DIAGNOSIS — O99210 Obesity complicating pregnancy, unspecified trimester: Secondary | ICD-10-CM

## 2017-07-07 DIAGNOSIS — F32A Depression, unspecified: Secondary | ICD-10-CM

## 2017-07-07 DIAGNOSIS — O0990 Supervision of high risk pregnancy, unspecified, unspecified trimester: Secondary | ICD-10-CM

## 2017-07-07 NOTE — Progress Notes (Signed)
Routine Prenatal Care Visit  Subjective  Jenny Hendrix is a 37 y.o. G2P0010 at [redacted]w[redacted]d being seen today for ongoing prenatal care.  She is currently monitored for the following issues for this high-risk pregnancy and has Pelvic pain in female; Anxiety and depression; Supervision of high risk pregnancy, antepartum; Obesity in pregnancy; GERD (gastroesophageal reflux disease); Hypothyroidism affecting pregnancy, antepartum; and Vomiting and diarrhea on their problem list.  ----------------------------------------------------------------------------------- Patient reports occasional contractions.   Contractions: Irritability. Vag. Bleeding: None.  Movement: Present. Denies leaking of fluid.  ----------------------------------------------------------------------------------- The following portions of the patient's history were reviewed and updated as appropriate: allergies, current medications, past family history, past medical history, past social history, past surgical history and problem list. Problem list updated.   Objective  Blood pressure 120/76, weight 214 lb (97.1 kg), last menstrual period 11/23/2016. Pregravid weight 212 lb (96.2 kg) Total Weight Gain 2 lb (0.907 kg) Urinalysis: Urine Protein: 2+ Urine Glucose: Negative  Fetal Status: Fetal Heart Rate (bpm): 130 Fundal Height: 32 cm Movement: Present     General:  Alert, oriented and cooperative. Patient is in no acute distress.  Skin: Skin is warm and dry. No rash noted.   Cardiovascular: Normal heart rate noted  Respiratory: Normal respiratory effort, no problems with respiration noted  Abdomen: Soft, gravid, appropriate for gestational age. Pain/Pressure: Absent     Pelvic:  Cervical exam deferred        Extremities: Normal range of motion.     ental Status: Normal mood and affect. Normal behavior. Normal judgment and thought content.     Assessment   37 y.o. G2P0010 at [redacted]w[redacted]d by  08/30/2017, by Last Menstrual Period  presenting for routine prenatal visit  Plan   pregnany 2  Problems (from 11/23/16 to present)    Problem Noted Resolved   Supervision of high risk pregnancy, antepartum 01/27/2017 by Dalia Heading, CNM No   Overview Addendum 07/07/2017  6:10 PM by Homero Fellers, MD       Clinic [facility] Prenatal Labs  Dating [place] Blood type: A/--/-- (09/18 1423)   Genetic Screen 1 Screen:  Negative      Antibody:Negative (09/18 1423)  Anatomic [place]  WS Rubella: 6.72 (09/18 1423) Varicella: Immune  GTT Early: negative       28 wk: elevated 3GTT: normal     RPR: Non Reactive (09/18 1423)   Rhogam  Not needed HBsAg: Negative (09/18 1423)   TDaP vaccine   06/24/2017                  HIV: Non Reactive (09/18 1423)   Flu Shot  Received at work GBS:   Contraception  Mirena IUD vs Tubal Pap: 12/2016 NIL HPV negative  CBB   given Info   CS/VBAC  Not applicable   Baby Food  Breast   Support Person                    Gestational age appropriate obstetric precautions including but not limited to vaginal bleeding, contractions, leaking of fluid and fetal movement were reviewed in detail with the patient.    Discussed Fetal Kick counts Discussed appropriate times to go to labor and delivery Discussed options for pain management in labor Discussed Doula program at hospital Discussed birth control options postpartum. Patient expressed that she is considering a tubal ligation because she only wants to have one child.  Return in about 2 weeks (around 07/21/2017) for ROB.  Robby Pirani  MD  [facility], [facility] 07/07/2017, 6:12 PM

## 2017-07-16 ENCOUNTER — Telehealth: Payer: Self-pay

## 2017-07-16 NOTE — Telephone Encounter (Signed)
Pt returned call and feeling fine. She did not fall, denies and bleeding of LOF or cramping.

## 2017-07-16 NOTE — Telephone Encounter (Signed)
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 Employer asked her to contact OB to see if there was anything to be worried about. Pt c/o cramping feeling at top of stomach but not severe.   Left msg for pt to call back. Cb# [phone]

## 2017-07-20 ENCOUNTER — Other Ambulatory Visit: Payer: Self-pay | Admitting: Obstetrics & Gynecology

## 2017-07-20 ENCOUNTER — Ambulatory Visit (INDEPENDENT_AMBULATORY_CARE_PROVIDER_SITE_OTHER): Payer: BLUE CROSS/BLUE SHIELD | Admitting: Obstetrics & Gynecology

## 2017-07-20 VITALS — BP 120/80 | Wt 212.0 lb

## 2017-07-20 DIAGNOSIS — O99283 Endocrine, nutritional and metabolic diseases complicating pregnancy, third trimester: Secondary | ICD-10-CM

## 2017-07-20 DIAGNOSIS — Z3A34 34 weeks gestation of pregnancy: Secondary | ICD-10-CM

## 2017-07-20 DIAGNOSIS — F419 Anxiety disorder, unspecified: Secondary | ICD-10-CM

## 2017-07-20 DIAGNOSIS — O0993 Supervision of high risk pregnancy, unspecified, third trimester: Secondary | ICD-10-CM

## 2017-07-20 DIAGNOSIS — F32A Depression, unspecified: Secondary | ICD-10-CM

## 2017-07-20 DIAGNOSIS — O99210 Obesity complicating pregnancy, unspecified trimester: Secondary | ICD-10-CM

## 2017-07-20 DIAGNOSIS — F329 Major depressive disorder, single episode, unspecified: Secondary | ICD-10-CM

## 2017-07-20 DIAGNOSIS — E039 Hypothyroidism, unspecified: Secondary | ICD-10-CM

## 2017-07-20 MED ORDER — SERTRALINE HCL 50 MG PO TABS: 50.0000 mg | ORAL_TABLET | Freq: Every day | ORAL | 3 refills | Status: DC

## 2017-07-20 NOTE — Progress Notes (Signed)
Prenatal Visit Note Date: 07/20/2017 Clinic: [facility]  Subjective:  Jenny Hendrix is a 37 y.o. G2P0010 at [redacted]w[redacted]d being seen today for ongoing prenatal care.  She is currently monitored for the following issues for this high-risk pregnancy and has Pelvic pain in female; Anxiety and depression; Supervision of high risk pregnancy, antepartum; Obesity in pregnancy; GERD (gastroesophageal reflux disease); Hypothyroidism affecting pregnancy, antepartum; and Vomiting and diarrhea on their problem list.  Patient reports no complaints.   Contractions: Irregular. Vag. Bleeding: None.  Movement: Present. Denies leaking of fluid.   The following portions of the patient's history were reviewed and updated as appropriate: allergies, current medications, past family history, past medical history, past social history, past surgical history and problem list. Problem list updated.  Objective:   Vitals:   07/20/17 1607  BP: 120/80  Weight: 212 lb (96.2 kg)    Fetal Status:     Movement: Present     General:  Alert, oriented and cooperative. Patient is in no acute distress.  Skin: Skin is warm and dry. No rash noted.   Cardiovascular: Normal heart rate noted  Respiratory: Normal respiratory effort, no problems with respiration noted  Abdomen: Soft, gravid, appropriate for gestational age. Pain/Pressure: Present     Pelvic:  Cervical exam deferred        Extremities: Normal range of motion.     Mental Status: Normal mood and affect. Normal behavior. Normal judgment and thought content.   Assessment and Plan:  Pregnancy: G2P0010 at [redacted]w[redacted]d  1. [redacted] weeks gestation of pregnancy  2. Hypothyroidism affecting pregnancy in third trimester Cont thy meds.  Levels last month normal range.  3. Supervision of high risk pregnancy in third trimester  4. Obesity in pregnancy BMI 38.  Risk factors discussed.  5. Anxiety and depression Cont Zoloft  6. H/o HSV.   Start Valtrex 36 weeks  Preterm labor  symptoms and general obstetric precautions including but not limited to vaginal bleeding, contractions, leaking of fluid and fetal movement were reviewed in detail with the patient. Please refer to After Visit Summary for other counseling recommendations.  Return in about 2 weeks (around 08/03/2017) for HROB.  Barnett Applebaum, MD, [facility], [facility] 07/20/2017  4:18 PM

## 2017-07-21 ENCOUNTER — Telehealth: Payer: Self-pay

## 2017-07-21 NOTE — Telephone Encounter (Signed)
FMLA/DISABILITY form for [employer] filled out, signature obtained and given to [place] for processing.

## 2017-07-22 ENCOUNTER — Telehealth: Payer: Self-pay

## 2017-07-22 ENCOUNTER — Other Ambulatory Visit: Payer: Self-pay | Admitting: Obstetrics & Gynecology

## 2017-07-22 MED ORDER — OSELTAMIVIR PHOSPHATE 75 MG PO CAPS: 75.0000 mg | ORAL_CAPSULE | Freq: Two times a day (BID) | ORAL | 0 refills | Status: AC

## 2017-07-22 NOTE — Telephone Encounter (Signed)
ERx done

## 2017-07-22 NOTE — Telephone Encounter (Signed)
PT called triage line stating she has tested positive for Flu A. 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 She is requesting [facility] [address]  Cherylann Banas CMA

## 2017-07-22 NOTE — Telephone Encounter (Signed)
Pt aware.

## 2017-07-24 ENCOUNTER — Telehealth: Payer: Self-pay | Admitting: Obstetrics & Gynecology

## 2017-07-24 NOTE — Telephone Encounter (Signed)
Left message for patient to let her know that her Disability paperwork is ready for pick up.

## 2017-08-04 ENCOUNTER — Ambulatory Visit (INDEPENDENT_AMBULATORY_CARE_PROVIDER_SITE_OTHER): Payer: BLUE CROSS/BLUE SHIELD | Admitting: Obstetrics & Gynecology

## 2017-08-04 VITALS — BP 120/80 | Wt 214.0 lb

## 2017-08-04 DIAGNOSIS — O0990 Supervision of high risk pregnancy, unspecified, unspecified trimester: Secondary | ICD-10-CM

## 2017-08-04 DIAGNOSIS — O99210 Obesity complicating pregnancy, unspecified trimester: Secondary | ICD-10-CM

## 2017-08-04 DIAGNOSIS — O99280 Endocrine, nutritional and metabolic diseases complicating pregnancy, unspecified trimester: Secondary | ICD-10-CM

## 2017-08-04 DIAGNOSIS — Z3A36 36 weeks gestation of pregnancy: Secondary | ICD-10-CM

## 2017-08-04 DIAGNOSIS — E039 Hypothyroidism, unspecified: Secondary | ICD-10-CM

## 2017-08-04 LAB — OB RESULTS CONSOLE GBS: STREP GROUP B AG: POSITIVE

## 2017-08-04 MED ORDER — VALACYCLOVIR HCL 500 MG PO TABS: 500.0000 mg | ORAL_TABLET | Freq: Every day | ORAL | 2 refills | Status: AC

## 2017-08-04 NOTE — Progress Notes (Signed)
  Subjective  Fetal Movement? yes Contractions? no Leaking Fluid? no Vaginal Bleeding? no  Objective  BP 120/80   Wt 214 lb (97.1 kg)   LMP 11/23/2016 (Approximate)   BMI 37.91 kg/m  General: NAD Pumonary: no increased work of breathing Abdomen: gravid, non-tender Extremities: no edema Psychiatric: mood appropriate, affect full Cx- cl/20/-3 Assessment  37 y.o. G2P0010 at [redacted]w[redacted]d by  08/30/2017, by Last Menstrual Period presenting for routine prenatal visit  Plan   Problem List Items Addressed This Visit      Endocrine   Hypothyroidism affecting pregnancy, antepartum     Other   Supervision of high risk pregnancy, antepartum   Obesity in pregnancy    Other Visit Diagnoses    [redacted] weeks gestation of pregnancy    -  Primary   Relevant Orders   Culture, beta strep (group b only)    Valtrex prevention  Barnett Applebaum, MD, [facility], [facility] 08/04/2017  4:28 PM

## 2017-08-04 NOTE — Patient Instructions (Signed)
Braxton Hicks Contractions °Contractions of the uterus can occur throughout pregnancy, but they are not always a sign that you are in labor. You may have practice contractions called Braxton Hicks contractions. These false labor contractions are sometimes confused with true labor. °What are Braxton Hicks contractions? °Braxton Hicks contractions are tightening movements that occur in the muscles of the uterus before labor. Unlike true labor contractions, these contractions do not result in opening (dilation) and thinning of the cervix. Toward the end of pregnancy (32-34 weeks), Braxton Hicks contractions can happen more often and may become stronger. These contractions are sometimes difficult to tell apart from true labor because they can be very uncomfortable. You should not feel embarrassed if you go to the hospital with false labor. °Sometimes, the only way to tell if you are in true labor is for your health care provider to look for changes in the cervix. The health care provider will do a physical exam and may monitor your contractions. If you are not in true labor, the exam should show that your cervix is not dilating and your water has not broken. °If there are other health problems associated with your pregnancy, it is completely safe for you to be sent home with false labor. You may continue to have Braxton Hicks contractions until you go into true labor. °How to tell the difference between true labor and false labor °True labor °· Contractions last 30-70 seconds. °· Contractions become very regular. °· Discomfort is usually felt in the top of the uterus, and it spreads to the lower abdomen and low back. °· Contractions do not go away with walking. °· Contractions usually become more intense and increase in frequency. °· The cervix dilates and gets thinner. °False labor °· Contractions are usually shorter and not as strong as true labor contractions. °· Contractions are usually irregular. °· Contractions  are often felt in the front of the lower abdomen and in the groin. °· Contractions may go away when you walk around or change positions while lying down. °· Contractions get weaker and are shorter-lasting as time goes on. °· The cervix usually does not dilate or become thin. °Follow these instructions at home: °· Take over-the-counter and prescription medicines only as told by your health care provider. °· Keep up with your usual exercises and follow other instructions from your health care provider. °· Eat and drink lightly if you think you are going into labor. °· If Braxton Hicks contractions are making you uncomfortable: °? Change your position from lying down or resting to walking, or change from walking to resting. °? Sit and rest in a tub of warm water. °? Drink enough fluid to keep your urine pale yellow. Dehydration may cause these contractions. °? Do slow and deep breathing several times an hour. °· Keep all follow-up prenatal visits as told by your health care provider. This is important. °Contact a health care provider if: °· You have a fever. °· You have continuous pain in your abdomen. °Get help right away if: °· Your contractions become stronger, more regular, and closer together. °· You have fluid leaking or gushing from your vagina. °· You pass blood-tinged mucus (bloody show). °· You have bleeding from your vagina. °· You have low back pain that you never had before. °· You feel your baby’s head pushing down and causing pelvic pressure. °· Your baby is not moving inside you as much as it used to. °Summary °· Contractions that occur before labor are called Braxton   Hicks contractions, false labor, or practice contractions. °· Braxton Hicks contractions are usually shorter, weaker, farther apart, and less regular than true labor contractions. True labor contractions usually become progressively stronger and regular and they become more frequent. °· Manage discomfort from Braxton Hicks contractions by  changing position, resting in a warm bath, drinking plenty of water, or practicing deep breathing. °This information is not intended to replace advice given to you by your health care provider. Make sure you discuss any questions you have with your health care provider. °Document Released: 08/28/2016 Document Revised: 08/28/2016 Document Reviewed: 08/28/2016 °Elsevier Interactive Patient Education © 2018 [employer] Inc[employer] ° °

## 2017-08-05 ENCOUNTER — Telehealth: Payer: Self-pay

## 2017-08-05 NOTE — Telephone Encounter (Signed)
Pt states PH was going to send in rx for valtrex.  Pt hasn't gotten notification from pharm that it is ready.  [facility] [address]  [phone]  [facility], they had trouble c phone system yesterday.  They have pt's order.  It should be ready in about 26min.  Pt aware.

## 2017-08-07 LAB — CULTURE, BETA STREP (GROUP B ONLY): Strep Gp B Culture: POSITIVE — AB

## 2017-08-13 ENCOUNTER — Observation Stay
Admission: EM | Admit: 2017-08-13 | Discharge: 2017-08-13 | Disposition: A | Discharge status: Home / Self Care | Payer: BLUE CROSS/BLUE SHIELD | Attending: Obstetrics and Gynecology | Admitting: Obstetrics and Gynecology

## 2017-08-13 ENCOUNTER — Ambulatory Visit (INDEPENDENT_AMBULATORY_CARE_PROVIDER_SITE_OTHER): Payer: BLUE CROSS/BLUE SHIELD | Admitting: Certified Nurse Midwife

## 2017-08-13 VITALS — BP 108/58 | Wt 212.0 lb

## 2017-08-13 DIAGNOSIS — B9689 Other specified bacterial agents as the cause of diseases classified elsewhere: Secondary | ICD-10-CM

## 2017-08-13 DIAGNOSIS — W19XXXA Unspecified fall, initial encounter: Secondary | ICD-10-CM | POA: Insufficient documentation

## 2017-08-13 DIAGNOSIS — O98313 Other infections with a predominantly sexual mode of transmission complicating pregnancy, third trimester: Secondary | ICD-10-CM

## 2017-08-13 DIAGNOSIS — O9989 Other specified diseases and conditions complicating pregnancy, childbirth and the puerperium: Principal | ICD-10-CM | POA: Insufficient documentation

## 2017-08-13 DIAGNOSIS — B079 Viral wart, unspecified: Secondary | ICD-10-CM

## 2017-08-13 DIAGNOSIS — Z79899 Other long term (current) drug therapy: Secondary | ICD-10-CM | POA: Insufficient documentation

## 2017-08-13 DIAGNOSIS — O0990 Supervision of high risk pregnancy, unspecified, unspecified trimester: Secondary | ICD-10-CM

## 2017-08-13 DIAGNOSIS — Z3A38 38 weeks gestation of pregnancy: Secondary | ICD-10-CM | POA: Insufficient documentation

## 2017-08-13 DIAGNOSIS — Z3A37 37 weeks gestation of pregnancy: Secondary | ICD-10-CM

## 2017-08-13 DIAGNOSIS — O26853 Spotting complicating pregnancy, third trimester: Secondary | ICD-10-CM

## 2017-08-13 DIAGNOSIS — Z888 Allergy status to other drugs, medicaments and biological substances status: Secondary | ICD-10-CM | POA: Diagnosis not present

## 2017-08-13 DIAGNOSIS — N9089 Other specified noninflammatory disorders of vulva and perineum: Secondary | ICD-10-CM

## 2017-08-13 DIAGNOSIS — N760 Acute vaginitis: Secondary | ICD-10-CM

## 2017-08-13 DIAGNOSIS — Z3490 Encounter for supervision of normal pregnancy, unspecified, unspecified trimester: Secondary | ICD-10-CM

## 2017-08-13 HISTORY — DX: Hypothyroidism, unspecified: E03.9

## 2017-08-13 LAB — POCT WET PREP (WET MOUNT): TRICHOMONAS WET PREP HPF POC: ABSENT

## 2017-08-13 MED ORDER — CLINDAMYCIN HCL 300 MG PO CAPS: 300.0000 mg | ORAL_CAPSULE | Freq: Two times a day (BID) | ORAL | 0 refills | Status: AC

## 2017-08-13 NOTE — Progress Notes (Signed)
ROB at 37wk4d: S: complains of spotting and cramping since falling down a couple steps and landing on hands and knees on Saturday 14 April. Baby active. No dysuria. Worried about +GBS culture. Taking Valtrex PPX. O: see flow sheet. FHTs WNL. Abdomen/ uterus NT.  Spec exam: large labia minora. Hypopigmented area on lower left minora (about 1 cm long x 0.5 cm wide)  with a papular lesion with a "head" on it. No bleeding Vagina: homogenous white discharge Wet prep: positive for clue cells and amine odor. Negative for hyphae and Trich Cervix: no lesions, pink, non friable. Closed/long/-2/cephalic GBS positive culture A: IUP at 37wk4days s/p fall 5 days ago followed by spotting and cramping-not currently bleeding. R/O abruption Bacterial vaginosis Labial lesion P: Clindamycin 300 mgm BID x 7 days Recommend monitoring in L&D for NST-patient sent to L&D RTO 1 week if fetal heart tracing reassuring. Monitor left labial lesion Treat GBS in labor Dalia Heading, CNM

## 2017-08-13 NOTE — OB Triage Note (Signed)
G2P0 pt [redacted]w[redacted]d presents from [facility] for monitoring following a fall on Saturday at 1730. Pt slipped on wet stairs outside front door and fell on hands and knees. She denies hitting her belly with the fall. Denies pain, LOF. She describes pink-tinged urine noted yesterday mid-day, but no bleeding noted since.

## 2017-08-13 NOTE — Progress Notes (Signed)
ROB Light pink vaginal bleeding

## 2017-08-14 NOTE — Final Progress Note (Signed)
Physician Final Progress Note  Patient ID: Jenny Hendrix MRN: [id number] DOB/AGE: Aug 08, 1980 37 y.o.  Admit date: 08/13/2017 Admitting provider: Homero Fellers, MD Discharge date: 08/14/2017    Admission Diagnoses: s/p Fall in pregnancy  Discharge Diagnoses:  Active Problems:   Pregnancy  Reactive fetal tracing  Hospital Course: Patient was sent for further evaluation from the office. She had a fall last Saturday and the NST was to make sure the infant status was still reassuring. Patient was found to have a reactive category I heart tracing. At the request of Dalia Heading I also examined her left labia which has what would appear to be a genital wart. I recommend that this be covered with tegaderm during delivery and can be treated when she is postpartum.   Consults: None  Significant Findings/ Diagnostic Studies: NST: category I tracing, reactive.  Procedures: None  Discharge Condition: good  Disposition:   Diet: Regular diet  Discharge Activity: Activity as tolerated   Allergies as of 08/13/2017      Reactions   Benzonatate Diarrhea   Metronidazole Other (See Comments)   Body aches, all side effects       Medication List    ASK your doctor about these medications   clindamycin 300 MG capsule Commonly known as:  CLEOCIN Take 1 capsule (300 mg total) by mouth 2 (two) times daily for 7 days.   docusate sodium 100 MG capsule Commonly known as:  COLACE Take 1 capsule (100 mg total) by mouth 2 (two) times daily as needed.   ferrous sulfate 325 (65 FE) MG tablet Commonly known as:  FERROUSUL Take 1 tablet (325 mg total) by mouth 2 (two) times daily.   levothyroxine 75 MCG tablet Commonly known as:  SYNTHROID Take 1 tablet (75 mcg total) by mouth daily before breakfast.   multivitamin-prenatal 27-0.8 MG Tabs tablet Take 1 tablet by mouth daily at 12 noon.   omeprazole 40 MG capsule Commonly known as:  PRILOSEC Take 20 mg by mouth daily.    promethazine 25 MG tablet Commonly known as:  PHENERGAN Take 1 tablet (25 mg total) by mouth every 8 (eight) hours as needed for nausea or vomiting.   sertraline 50 MG tablet Commonly known as:  ZOLOFT Take 1 tablet (50 mg total) by mouth daily.   valACYclovir 500 MG tablet Commonly known as:  VALTREX Take 1 tablet (500 mg total) by mouth daily.        Total time spent taking care of this patient: 30 minutes  Signed: Caidan Hubbert R Kivon Aprea 08/14/2017, 3:38 PM

## 2017-08-14 NOTE — Discharge Summary (Signed)
Please see final progress note. Adrian Prows MD [facility], [facility] 08/14/17 3:45 PM

## 2017-08-21 ENCOUNTER — Encounter: Payer: Self-pay | Admitting: Maternal Newborn

## 2017-08-21 ENCOUNTER — Ambulatory Visit (INDEPENDENT_AMBULATORY_CARE_PROVIDER_SITE_OTHER): Payer: BLUE CROSS/BLUE SHIELD | Admitting: Maternal Newborn

## 2017-08-21 VITALS — BP 120/80 | Wt 214.0 lb

## 2017-08-21 DIAGNOSIS — O0990 Supervision of high risk pregnancy, unspecified, unspecified trimester: Secondary | ICD-10-CM

## 2017-08-21 DIAGNOSIS — Z3A38 38 weeks gestation of pregnancy: Secondary | ICD-10-CM

## 2017-08-21 NOTE — Progress Notes (Signed)
    Routine Prenatal Care Visit  Subjective  Jenny Hendrix is a 37 y.o. G2P0010 at [redacted]w[redacted]d being seen today for ongoing prenatal care.  She is currently monitored for the following issues for this high-risk pregnancy and has Pelvic pain in female; Anxiety and depression; Supervision of high risk pregnancy, antepartum; Obesity in pregnancy; GERD (gastroesophageal reflux disease); Hypothyroidism affecting pregnancy, antepartum; Vomiting and diarrhea; Labial lesion; and Pregnancy on their problem list.  ----------------------------------------------------------------------------------- Patient reports occasional contractions and pressure.   Contractions: Irritability. Vag. Bleeding: None.  Movement: Present. Denies leaking of fluid.  ----------------------------------------------------------------------------------- The following portions of the patient's history were reviewed and updated as appropriate: allergies, current medications, past family history, past medical history, past social history, past surgical history and problem list. Problem list updated.   Objective  Blood pressure 120/80, weight 214 lb (97.1 kg), last menstrual period 11/23/2016. Pregravid weight 212 lb (96.2 kg) Total Weight Gain 2 lb (0.907 kg) Urinalysis: Urine Protein: 1+ Urine Glucose: Negative  Fetal Status: Fetal Heart Rate (bpm): 139 Fundal Height: 36 cm Movement: Present  Presentation: Vertex  General:  Alert, oriented and cooperative. Patient is in no acute distress.  Skin: Skin is warm and dry. No rash noted.   Cardiovascular: Normal heart rate noted  Respiratory: Normal respiratory effort, no problems with respiration noted  Abdomen: Soft, gravid, appropriate for gestational age. Pain/Pressure: Present     Pelvic:  Cervical exam performed Dilation: Closed Effacement (%): 20 Station: -2  Extremities: Normal range of motion.  Edema: None  Mental Status: Normal mood and affect. Normal behavior. Normal  judgment and thought content.     Assessment   37 y.o. G2P0010 at [redacted]w[redacted]d, EDD 08/30/2017 by Last Menstrual Period presenting for routine prenatal visit.  Plan   pregnany 2  Problems (from 11/23/16 to present)    Problem Noted Resolved   Supervision of high risk pregnancy, antepartum 01/27/2017 by Dalia Heading, CNM No   Overview Addendum 07/07/2017  6:10 PM by Homero Fellers, [place]  Dating [place] Blood type: A/--/-- (09/18 1423)   Genetic Screen 1 Screen:  Negative      Antibody:Negative (09/18 1423)  Anatomic [place]  WS Rubella: 6.72 (09/18 1423) Varicella: Immune  GTT Early: negative       28 wk: elevated 3GTT: normal     RPR: Non Reactive (09/18 1423)   Rhogam  Not needed HBsAg: Negative (09/18 1423)   TDaP vaccine   06/24/2017                  HIV: Non Reactive (09/18 1423)   Flu Shot  Received at work GBS:   Contraception  Mirena IUD vs Tubal Pap: 12/2016 NIL HPV negative  CBB   given Info   CS/VBAC  Not applicable   Baby Food  Breast   Support Person                   Discussed postdates labor management; would like PDIOL at 41 weeks. Will schedule and notify about date/time.  Term labor symptoms and general obstetric precautions including but not limited to vaginal bleeding, contractions, leaking of fluid and fetal movement were reviewed.  Return in about 1 week (around 08/28/2017) for ROB.  Avel Sensor, CNM 08/21/2017  4:17 PM

## 2017-08-26 ENCOUNTER — Other Ambulatory Visit: Payer: Self-pay | Admitting: Maternal Newborn

## 2017-08-27 ENCOUNTER — Ambulatory Visit (INDEPENDENT_AMBULATORY_CARE_PROVIDER_SITE_OTHER): Payer: BLUE CROSS/BLUE SHIELD | Admitting: Obstetrics & Gynecology

## 2017-08-27 VITALS — BP 130/80 | Wt 212.0 lb

## 2017-08-27 DIAGNOSIS — Z3A39 39 weeks gestation of pregnancy: Secondary | ICD-10-CM

## 2017-08-27 DIAGNOSIS — O0990 Supervision of high risk pregnancy, unspecified, unspecified trimester: Secondary | ICD-10-CM

## 2017-08-27 NOTE — Progress Notes (Signed)
  Subjective  Fetal Movement? yes Contractions? no Leaking Fluid? no Vaginal Bleeding? no  Objective  BP 130/80   Wt 212 lb (96.2 kg)   LMP 11/23/2016 (Approximate)   BMI 37.55 kg/m  General: NAD Pumonary: no increased work of breathing Abdomen: gravid, non-tender Extremities: no edema Psychiatric: mood appropriate, affect full Cervix closed/20/-3 Assessment  37 y.o. G2P0010 at [redacted]w[redacted]d by  08/30/2017, by Last Menstrual Period presenting for routine prenatal visit  Plan   Problem List Items Addressed This Visit      Other   Supervision of high risk pregnancy, antepartum   Pregnancy - Primary    IOL discussed (May 12 at latest)  Barnett Applebaum, MD, [facility], [facility] 08/27/2017  2:59 PM

## 2017-08-27 NOTE — Patient Instructions (Signed)
Labor Induction Labor induction is when steps are taken to cause a pregnant woman to begin the labor process. Most women go into labor on their own between 37 weeks and 42 weeks of the pregnancy. When this does not happen or when there is a medical need, methods may be used to induce labor. Labor induction causes a pregnant woman's uterus to contract. It also causes the cervix to soften (ripen), open (dilate), and thin out (efface). Usually, labor is not induced before 39 weeks of the pregnancy unless there is a problem with the baby or mother. Before inducing labor, your health care provider will consider a number of factors, including the following:  The medical condition of you and the baby.  How many weeks along you are.  The status of the baby's lung maturity.  The condition of the cervix.  The position of the baby. What are the reasons for labor induction? Labor may be induced for the following reasons:  The health of the baby or mother is at risk.  The pregnancy is overdue by 1 week or more.  The water breaks but labor does not start on its own.  The mother has a health condition or serious illness, such as high blood pressure, infection, placental abruption, or diabetes.  The amniotic fluid amounts are low around the baby.  The baby is distressed. Convenience or wanting the baby to be born on a certain date is not a reason for inducing labor. What methods are used for labor induction? Several methods of labor induction may be used, such as:  Prostaglandin medicine. This medicine causes the cervix to dilate and ripen. The medicine will also start contractions. It can be taken by mouth or by inserting a suppository into the vagina.  Inserting a thin tube (catheter) with a balloon on the end into the vagina to dilate the cervix. Once inserted, the balloon is expanded with water, which causes the cervix to open.  Stripping the membranes. Your health care provider separates  amniotic sac tissue from the cervix, causing the cervix to be stretched and causing the release of a hormone called progesterone. This may cause the uterus to contract. It is often done during an office visit. You will be sent home to wait for the contractions to begin. You will then come in for an induction.  Breaking the water. Your health care provider makes a hole in the amniotic sac using a small instrument. Once the amniotic sac breaks, contractions should begin. This may still take hours to see an effect.  Medicine to trigger or strengthen contractions. This medicine is given through an IV access tube inserted into a vein in your arm. All of the methods of induction, besides stripping the membranes, will be done in the hospital. Induction is done in the hospital so that you and the baby can be carefully monitored. How long does it take for labor to be induced? Some inductions can take up to 2-3 days. Depending on the cervix, it usually takes less time. It takes longer when you are induced early in the pregnancy or if this is your first pregnancy. If a mother is still pregnant and the induction has been going on for 2-3 days, either the mother will be sent home or a cesarean delivery will be needed. What are the risks associated with labor induction? Some of the risks of induction include:  Changes in fetal heart rate, such as too high, too low, or erratic.  Fetal distress.    Chance of infection for the mother and baby.  Increased chance of having a cesarean delivery.  Breaking off (abruption) of the placenta from the uterus (rare).  Uterine rupture (very rare). When induction is needed for medical reasons, the benefits of induction may outweigh the risks. What are some reasons for not inducing labor? Labor induction should not be done if:  It is shown that your baby does not tolerate labor.  You have had previous surgeries on your uterus, such as a myomectomy or the removal of  fibroids.  Your placenta lies very low in the uterus and blocks the opening of the cervix (placenta previa).  Your baby is not in a head-down position.  The umbilical cord drops down into the birth canal in front of the baby. This could cut off the baby's blood and oxygen supply.  You have had a previous cesarean delivery.  There are unusual circumstances, such as the baby being extremely premature. This information is not intended to replace advice given to you by your health care provider. Make sure you discuss any questions you have with your health care provider. Document Released: 09/03/2006 Document Revised: 09/20/2015 Document Reviewed: 11/11/2012 Elsevier Interactive Patient Education  2017 [employer]vier [employer]  

## 2017-08-28 ENCOUNTER — Telehealth: Payer: Self-pay

## 2017-08-28 NOTE — Telephone Encounter (Signed)
OK, adjust and send as able

## 2017-08-28 NOTE — Telephone Encounter (Signed)
Please advise 

## 2017-08-28 NOTE — Telephone Encounter (Signed)
Pt seen yesterday and given a note for light duty thru her due date 08/30/17. She needs a note for the week after her due date 08/31/17 thru her induction date which she believes to be 09/06/17. Please fax to her work at [phone]

## 2017-08-28 NOTE — Telephone Encounter (Signed)
done

## 2017-09-03 ENCOUNTER — Ambulatory Visit: Payer: BLUE CROSS/BLUE SHIELD | Admitting: Maternal Newborn

## 2017-09-03 VITALS — BP 122/80 | Wt 213.0 lb

## 2017-09-03 DIAGNOSIS — O0990 Supervision of high risk pregnancy, unspecified, unspecified trimester: Secondary | ICD-10-CM

## 2017-09-03 DIAGNOSIS — Z3A40 40 weeks gestation of pregnancy: Secondary | ICD-10-CM

## 2017-09-03 NOTE — Progress Notes (Signed)
ROB NST 

## 2017-09-03 NOTE — Progress Notes (Signed)
    Routine Prenatal Care Visit  Subjective  Jenny Hendrix is a 37 y.o. G2P0010 at [redacted]w[redacted]d being seen today for ongoing prenatal care.  She is currently monitored for the following issues for this high-risk pregnancy and has Pelvic pain in female; Anxiety and depression; Supervision of high risk pregnancy, antepartum; Obesity in pregnancy; GERD (gastroesophageal reflux disease); Hypothyroidism affecting pregnancy, antepartum; Vomiting and diarrhea; Labial lesion; and Pregnancy on their problem list.  ----------------------------------------------------------------------------------- Patient reports discomforts of late pregnancy.  Contractions: Irregular. Vag. Bleeding: None.  Movement: Present. Denies leaking of fluid.  ----------------------------------------------------------------------------------- The following portions of the patient's history were reviewed and updated as appropriate: allergies, current medications, past family history, past medical history, past social history, past surgical history and problem list. Problem list updated.   Objective  Blood pressure 122/80, weight 213 lb (96.6 kg), last menstrual period 11/23/2016. Pregravid weight 212 lb (96.2 kg) Total Weight Gain 1 lb (0.454 kg) Urinalysis:      Fetal Status: Fetal Heart Rate (bpm): 145   Movement: Present  Presentation: Vertex  General:  Alert, oriented and cooperative. Patient is in no acute distress.  Skin: Skin is warm and dry. No rash noted.   Cardiovascular: Normal heart rate noted  Respiratory: Normal respiratory effort, no problems with respiration noted  Abdomen: Soft, gravid, appropriate for gestational age. Pain/Pressure: Present     Pelvic:  Cervical exam performed Dilation: Fingertip Effacement (%): 30 Station: -3  Extremities: Normal range of motion.  Edema: Trace  Mental Status: Normal mood and affect. Normal behavior. Normal judgment and thought content.   NST Baseline: 125 Variability:  moderate Accelerations: present Decelerations: absent Tocometry: none The patient was monitored for 20 minutes, fetal heart rate tracing was deemed reactive, category I tracing.  Assessment   37 y.o. G2P0010 at [redacted]w[redacted]d, EDD 08/30/2017 by Last Menstrual Period presenting for routine prenatal visit.  Plan   pregnany 2  Problems (from 11/23/16 to present)    Problem Noted Resolved   Supervision of high risk pregnancy, antepartum 01/27/2017 by Dalia Heading, CNM No   Overview Addendum 07/07/2017  6:10 PM by Homero Fellers, [place]  Dating [place] Blood type: A/--/-- (09/18 1423)   Genetic Screen 1 Screen:  Negative      Antibody:Negative (09/18 1423)  Anatomic [place]  WS Rubella: 6.72 (09/18 1423) Varicella: Immune  GTT Early: negative       28 wk: elevated 3GTT: normal     RPR: Non Reactive (09/18 1423)   Rhogam  Not needed HBsAg: Negative (09/18 1423)   TDaP vaccine   06/24/2017                  HIV: Non Reactive (09/18 1423)   Flu Shot  Received at work GBS:   Contraception  Mirena IUD vs Tubal Pap: 12/2016 NIL HPV negative  CBB   given Info   CS/VBAC  Not applicable   Baby Food  Breast   Support Person                 NST reactive. Some increase in clear discharge.  Term labor symptoms and general obstetric precautions including but not limited to vaginal bleeding, contractions, leaking of fluid and fetal movement were reviewed.  IOL on 09/06/2017 if labor does not occur sooner.  Avel Sensor, CNM 09/04/2017  8:16 AM

## 2017-09-04 ENCOUNTER — Encounter: Payer: Self-pay | Admitting: Maternal Newborn

## 2017-09-05 ENCOUNTER — Inpatient Hospital Stay
Admission: EM | Admit: 2017-09-05 | Discharge: 2017-09-08 | DRG: 787 | Disposition: A | Discharge status: Home / Self Care | Payer: BLUE CROSS/BLUE SHIELD | Attending: Obstetrics and Gynecology | Admitting: Obstetrics and Gynecology

## 2017-09-05 ENCOUNTER — Other Ambulatory Visit: Payer: Self-pay

## 2017-09-05 DIAGNOSIS — O9081 Anemia of the puerperium: Secondary | ICD-10-CM | POA: Diagnosis not present

## 2017-09-05 DIAGNOSIS — E039 Hypothyroidism, unspecified: Secondary | ICD-10-CM | POA: Diagnosis present

## 2017-09-05 DIAGNOSIS — O99824 Streptococcus B carrier state complicating childbirth: Secondary | ICD-10-CM | POA: Diagnosis present

## 2017-09-05 DIAGNOSIS — D62 Acute posthemorrhagic anemia: Secondary | ICD-10-CM | POA: Diagnosis not present

## 2017-09-05 DIAGNOSIS — O368130 Decreased fetal movements, third trimester, not applicable or unspecified: Secondary | ICD-10-CM | POA: Diagnosis present

## 2017-09-05 DIAGNOSIS — A6000 Herpesviral infection of urogenital system, unspecified: Secondary | ICD-10-CM | POA: Diagnosis present

## 2017-09-05 DIAGNOSIS — O9832 Other infections with a predominantly sexual mode of transmission complicating childbirth: Secondary | ICD-10-CM | POA: Diagnosis present

## 2017-09-05 DIAGNOSIS — O99284 Endocrine, nutritional and metabolic diseases complicating childbirth: Secondary | ICD-10-CM | POA: Diagnosis present

## 2017-09-05 DIAGNOSIS — O480 Post-term pregnancy: Principal | ICD-10-CM | POA: Diagnosis present

## 2017-09-05 DIAGNOSIS — Z01419 Encounter for gynecological examination (general) (routine) without abnormal findings: Secondary | ICD-10-CM | POA: Diagnosis present

## 2017-09-05 DIAGNOSIS — Z3A40 40 weeks gestation of pregnancy: Secondary | ICD-10-CM | POA: Diagnosis not present

## 2017-09-05 DIAGNOSIS — O0990 Supervision of high risk pregnancy, unspecified, unspecified trimester: Secondary | ICD-10-CM

## 2017-09-05 DIAGNOSIS — Z3490 Encounter for supervision of normal pregnancy, unspecified, unspecified trimester: Secondary | ICD-10-CM | POA: Diagnosis present

## 2017-09-05 LAB — TYPE AND SCREEN
ABO/RH(D): A POS
Antibody Screen: NEGATIVE

## 2017-09-05 LAB — CBC
HEMATOCRIT: 37.6 % (ref 35.0–47.0)
HEMOGLOBIN: 12.5 g/dL (ref 12.0–16.0)
MCH: 28.2 pg (ref 26.0–34.0)
MCHC: 33.4 g/dL (ref 32.0–36.0)
MCV: 84.5 fL (ref 80.0–100.0)
Platelets: 261 10*3/uL (ref 150–440)
RBC: 4.44 MIL/uL (ref 3.80–5.20)
RDW: 15 % — ABNORMAL HIGH (ref 11.5–14.5)
WBC: 11.4 10*3/uL — ABNORMAL HIGH (ref 3.6–11.0)

## 2017-09-05 MED ORDER — OXYTOCIN 40 UNITS IN LACTATED RINGERS INFUSION - SIMPLE MED
INTRAVENOUS | Status: AC
  Filled 2017-09-05: qty 1000

## 2017-09-05 MED ORDER — OXYTOCIN 40 UNITS IN LACTATED RINGERS INFUSION - SIMPLE MED
1.0000 m[IU]/min | INTRAVENOUS | Status: DC
  Administered 2017-09-06: 1000 mL via INTRAVENOUS
  Administered 2017-09-06 (×2): 1 m[IU]/min via INTRAVENOUS
  Filled 2017-09-05 (×3): qty 1000

## 2017-09-05 MED ORDER — PENICILLIN G POT IN DEXTROSE 60000 UNIT/ML IV SOLN
3.0000 10*6.[IU] | INTRAVENOUS | Status: DC
  Administered 2017-09-06 (×3): 3 10*6.[IU] via INTRAVENOUS
  Filled 2017-09-05 (×13): qty 50

## 2017-09-05 MED ORDER — ACETAMINOPHEN 325 MG PO TABS
650.0000 mg | ORAL_TABLET | ORAL | Status: DC | PRN
  Administered 2017-09-06: 650 mg via ORAL
  Filled 2017-09-05: qty 2

## 2017-09-05 MED ORDER — LACTATED RINGERS IV SOLN
INTRAVENOUS | Status: DC
  Administered 2017-09-05 – 2017-09-06 (×7): via INTRAVENOUS

## 2017-09-05 MED ORDER — FENTANYL CITRATE (PF) 100 MCG/2ML IJ SOLN
50.0000 ug | INTRAMUSCULAR | Status: DC | PRN
  Administered 2017-09-06 (×2): 100 ug via INTRAVENOUS
  Filled 2017-09-05 (×2): qty 2

## 2017-09-05 MED ORDER — SODIUM CHLORIDE 0.9 % IV SOLN
5.0000 10*6.[IU] | Freq: Once | INTRAVENOUS | Status: AC
  Administered 2017-09-06: 5 10*6.[IU] via INTRAVENOUS
  Filled 2017-09-05: qty 5

## 2017-09-05 MED ORDER — AMMONIA AROMATIC IN INHA
RESPIRATORY_TRACT | Status: AC
  Filled 2017-09-05: qty 10

## 2017-09-05 MED ORDER — LIDOCAINE HCL (PF) 1 % IJ SOLN
INTRAMUSCULAR | Status: AC
  Filled 2017-09-05: qty 30

## 2017-09-05 MED ORDER — SERTRALINE HCL 50 MG PO TABS
50.0000 mg | ORAL_TABLET | Freq: Every day | ORAL | Status: DC
  Administered 2017-09-05 – 2017-09-06 (×2): 50 mg via ORAL
  Filled 2017-09-05 (×3): qty 1

## 2017-09-05 MED ORDER — LACTATED RINGERS IV SOLN: 500.0000 mL | INTRAVENOUS | Status: DC | PRN

## 2017-09-05 MED ORDER — MISOPROSTOL 25 MCG QUARTER TABLET
25.0000 ug | ORAL_TABLET | ORAL | Status: DC | PRN
  Administered 2017-09-05 (×3): 25 ug via VAGINAL
  Filled 2017-09-05 (×3): qty 1

## 2017-09-05 MED ORDER — OXYTOCIN 10 UNIT/ML IJ SOLN
INTRAMUSCULAR | Status: AC
  Filled 2017-09-05: qty 2

## 2017-09-05 MED ORDER — ONDANSETRON HCL 4 MG/2ML IJ SOLN
4.0000 mg | Freq: Four times a day (QID) | INTRAMUSCULAR | Status: DC | PRN
  Administered 2017-09-06 (×3): 4 mg via INTRAVENOUS
  Filled 2017-09-05 (×3): qty 2

## 2017-09-05 MED ORDER — TERBUTALINE SULFATE 1 MG/ML IJ SOLN: 0.2500 mg | Freq: Once | INTRAMUSCULAR | Status: DC | PRN

## 2017-09-05 MED ORDER — OXYTOCIN 40 UNITS IN LACTATED RINGERS INFUSION - SIMPLE MED: 2.5000 [IU]/h | INTRAVENOUS | Status: DC

## 2017-09-05 MED ORDER — OXYTOCIN BOLUS FROM INFUSION: 500.0000 mL | Freq: Once | INTRAVENOUS | Status: DC

## 2017-09-05 MED ORDER — LEVOTHYROXINE SODIUM 75 MCG PO TABS
75.0000 ug | ORAL_TABLET | Freq: Every day | ORAL | Status: DC
  Administered 2017-09-05 – 2017-09-06 (×2): 75 ug via ORAL
  Filled 2017-09-05 (×2): qty 1

## 2017-09-05 MED ORDER — MISOPROSTOL 200 MCG PO TABS
ORAL_TABLET | ORAL | Status: AC
  Filled 2017-09-05: qty 4

## 2017-09-05 MED ORDER — DOCUSATE SODIUM 100 MG PO CAPS
100.0000 mg | ORAL_CAPSULE | Freq: Two times a day (BID) | ORAL | Status: DC | PRN
  Administered 2017-09-05: 100 mg via ORAL
  Filled 2017-09-05: qty 1

## 2017-09-05 MED ORDER — PANTOPRAZOLE SODIUM 40 MG PO TBEC
80.0000 mg | DELAYED_RELEASE_TABLET | Freq: Every day | ORAL | Status: DC
  Administered 2017-09-05: 80 mg via ORAL
  Filled 2017-09-05 (×2): qty 2

## 2017-09-05 NOTE — H&P (Signed)
Obstetrics Admission History & Physical   Contractions, bloody show, decreased fetal movement, planned IOL tomorrow   HPI:  37 y.o. G2P0010 @ [redacted]w[redacted]d (08/30/2017, by Last Menstrual Period). Admitted on 09/05/2017:   Patient Active Problem List   Diagnosis Date Noted  . Encounter for planned induction of labor 09/05/2017  . Labial lesion 08/13/2017  . Pregnancy 08/13/2017  . Vomiting and diarrhea 06/01/2017  . Anxiety and depression 01/27/2017  . Supervision of high risk pregnancy, antepartum 01/27/2017  . Obesity in pregnancy 01/27/2017  . GERD (gastroesophageal reflux disease) 01/27/2017  . Hypothyroidism affecting pregnancy, antepartum 01/27/2017  . Pelvic pain in female 01/12/2017     Presents for contractions throughout the night and some bloody show on the toilet paper when she wiped this morning. Baby moving well last night but perceived decreased movement this morning. Induction scheduled for tomorrow. Denies loss of fluid.  Prenatal care at: at [facility]. 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  ROS: A review of systems was performed and negative, except as stated in the above HPI. PMHx:  Past Medical History:  Diagnosis Date  . Anxiety   . ASCUS of cervix with negative high risk HPV 05/22/2016  . Hypothyroidism    PSHx:  Past Surgical History:  Procedure Laterality Date  . DILATION AND EVACUATION N/A 06/19/2016   Procedure: DILATATION AND EVACUATION;  Surgeon: Gae Dry, MD;  Location: [facility] ORS;  Service: Gynecology;  Laterality: N/A;  . TONSILLECTOMY AND ADENOIDECTOMY    . WISDOM TOOTH EXTRACTION     Medications:  Medications Prior to Admission  Medication Sig Dispense Refill Last Dose  . ferrous sulfate (FERROUSUL) 325 (65 FE) MG tablet Take 1 tablet (325 mg total) by mouth 2 (two) times daily. 60 tablet 1 09/04/2017 at Unknown time  . levothyroxine (SYNTHROID) 75 MCG tablet Take 1 tablet (75 mcg total) by mouth daily before breakfast. 30  tablet 6 09/04/2017 at Unknown time  . omeprazole (PRILOSEC) 40 MG capsule Take 20 mg by mouth daily.   09/04/2017 at Unknown time  . ondansetron (ZOFRAN) 4 MG tablet Take 4 mg by mouth every 8 (eight) hours as needed for nausea or vomiting.   09/04/2017 at Unknown time  . Prenatal Vit-Fe Fumarate-FA (MULTIVITAMIN-PRENATAL) 27-0.8 MG TABS tablet Take 1 tablet by mouth daily at 12 noon.   09/04/2017 at Unknown time  . promethazine (PHENERGAN) 25 MG tablet Take 1 tablet (25 mg total) by mouth every 8 (eight) hours as needed for nausea or vomiting. 30 tablet 2 Past Month at Unknown time  . sertraline (ZOLOFT) 50 MG tablet Take 1 tablet (50 mg total) by mouth daily. 90 tablet 3 09/04/2017 at Unknown time  . valACYclovir (VALTREX) 500 MG tablet Take 500 mg by mouth daily.   09/04/2017 at Unknown time  . docusate sodium (COLACE) 100 MG capsule Take 1 capsule (100 mg total) by mouth 2 (two) times daily as needed. (Patient not taking: Reported on 09/05/2017) 30 capsule 2 Not Taking at Unknown time   Allergies: is allergic to benzonatate and metronidazole. OBHx:  OB History  Gravida Para Term Preterm AB Living  2       1    SAB TAB Ectopic Multiple Live Births  1            # Outcome Date GA Lbr Len/2nd Weight Sex Delivery Anes PTL Lv  2 Current           1 SAB 06/19/16  XTA:VWPVX than listed in HPI remarkable for: Diabetes in her mother; hyperlipidemia, hypertension, breast cancer, and thyroid disease in her maternal grandmother. No family history of birth defects. Soc Hx: Never smoker, Alcohol: none, Recreational drug use: none and Pregnancy welcomed  Objective:   Vitals:   09/05/17 0918 09/05/17 1036  BP:  128/87  Pulse:  89  Temp:    SpO2: 99%    Constitutional: Well nourished, well developed female in no acute distress.  HEENT: normal Skin: Warm and dry.  Cardiovascular:Regular rate and rhythm.   Extremity: trace to 1+ bilateral pedal edema Respiratory: Clear to auscultation  bilaterally. Normal respiratory effort Abdomen: gravid, mild tenderness Neuro: Cranial nerves grossly intact Psych: Alert and Oriented x3. No memory deficits. Normal mood and affect.  MS: normal gait, normal bilateral lower extremity ROM/strength/stability.  Pelvic exam: is not limited by body habitus External Genitalia, Bartholin's glands, Urethra, Skene's glands: within normal limits Vagina: within normal limits and with normal mucosa, scant blood in the vault. No lesions visualized on left labia or elsewhere. Cervix: 0/20/-3 Uterus: Spontaneous uterine activity  Adnexa: not evaluated  EFM: FHR: 125 bpm, variability: moderate,  accelerations:  Present,  decelerations:  Absent Toco: Frequency: Every 2-7  minutes, Duration: 60-110 seconds and Intensity: mild Patient appreciated fetal movement on arrival in Labor and Delivery.   Perinatal info:  Blood type: A positive Rubella - Immune Varicella - Immune TDaP Given during third trimester of this pregnancy RPR NR / HIV Neg/ HBsAg Neg   Assessment & Plan:   37 y.o. G2P0010 @ [redacted]w[redacted]d, Admitted on 09/05/2017 for induction of labor due to postdates, some spontaneous uterine activity.    Admit for labor, Antibiotics for GBS prophylaxis, Observe for cervical change, Fetal Wellbeing Reassuring, Epidural when ready and AROM when Appropriate.  Labs reviewed and plan discussed with patient.  Avel Sensor, CNM [facility] 09/05/2017  11:37 AM

## 2017-09-05 NOTE — Progress Notes (Addendum)
  Labor Progress Note   37 y.o. G2P0010 @ [redacted]w[redacted]d , admitted for  Pregnancy, Labor Management. PDIOL  Subjective:  Getting more uncomfortable with contractions but coping well.   Objective:  BP 111/87 (BP Location: Left Arm)   Pulse 90   Temp 99.3 F (37.4 C) (Oral)   Resp 16   Ht 5\' 3"  (1.6 m)   Wt 213 lb (96.6 kg)   LMP 11/23/2016 (Approximate)   SpO2 99%   BMI 37.73 kg/m  Abd: mild Extr: trace to 1+ bilateral pedal edema SVE: 0.5/20/-3 (RN exam)  EFM: FHR: 135 bpm, variability: moderate,  accelerations:  Present,  decelerations:  Absent Toco: Frequency: Every 1.5-6 minutes, Duration:40-70 seconds and Intensity: mild to moderate Labs: I have reviewed the patient's lab results.   Assessment & Plan:  G2P0010 @ [redacted]w[redacted]d, admitted for  Pregnancy and Labor/Delivery Management  1. Pain management: plans epidural when appropriate. 2. FWB: FHT category I.  3. ID: GBS positive 4. Labor management: Has had second dose of Cytotec, continue to monitor for cervical change and labor progress.  All discussed with patient.  Avel Sensor, CNM 09/05/2017  8:33 PM

## 2017-09-06 ENCOUNTER — Encounter
Admission: EM | Disposition: A | Discharge status: Home / Self Care | Payer: Self-pay | Source: Home / Self Care | Attending: Obstetrics and Gynecology

## 2017-09-06 ENCOUNTER — Inpatient Hospital Stay: Payer: BLUE CROSS/BLUE SHIELD | Admitting: Anesthesiology

## 2017-09-06 DIAGNOSIS — Z3A40 40 weeks gestation of pregnancy: Secondary | ICD-10-CM

## 2017-09-06 DIAGNOSIS — O480 Post-term pregnancy: Secondary | ICD-10-CM

## 2017-09-06 SURGERY — Surgical Case
Anesthesia: Epidural | Site: Abdomen | Wound class: Clean Contaminated

## 2017-09-06 MED ORDER — METHYLERGONOVINE MALEATE 0.2 MG/ML IJ SOLN
INTRAMUSCULAR | Status: AC
  Filled 2017-09-06: qty 1

## 2017-09-06 MED ORDER — KETOROLAC TROMETHAMINE 30 MG/ML IJ SOLN
INTRAMUSCULAR | Status: AC
Start: 2017-09-06 — End: 2017-09-07
  Filled 2017-09-06: qty 1

## 2017-09-06 MED ORDER — KETOROLAC TROMETHAMINE 30 MG/ML IJ SOLN: 30.0000 mg | Freq: Four times a day (QID) | INTRAMUSCULAR | Status: DC

## 2017-09-06 MED ORDER — FENTANYL CITRATE (PF) 100 MCG/2ML IJ SOLN
INTRAMUSCULAR | Status: DC | PRN
  Administered 2017-09-06: 100 ug via EPIDURAL

## 2017-09-06 MED ORDER — CEFAZOLIN SODIUM-DEXTROSE 2-4 GM/100ML-% IV SOLN
2.0000 g | INTRAVENOUS | Status: AC
Start: 2017-09-06 — End: 2017-09-06
  Administered 2017-09-06: 2 g via INTRAVENOUS
  Filled 2017-09-06: qty 100

## 2017-09-06 MED ORDER — EPHEDRINE SULFATE-NACL 50-0.9 MG/10ML-% IV SOSY
PREFILLED_SYRINGE | INTRAVENOUS | Status: DC | PRN
  Administered 2017-09-06: 10 mg via INTRAVENOUS

## 2017-09-06 MED ORDER — MORPHINE SULFATE (PF) 0.5 MG/ML IJ SOLN
INTRAMUSCULAR | Status: DC | PRN
  Administered 2017-09-06: 3 mg via EPIDURAL

## 2017-09-06 MED ORDER — ACETAMINOPHEN 325 MG PO TABS: 650.0000 mg | ORAL_TABLET | ORAL | Status: DC | PRN

## 2017-09-06 MED ORDER — LEVOTHYROXINE SODIUM 75 MCG PO TABS
75.0000 ug | ORAL_TABLET | Freq: Every day | ORAL | Status: DC
  Administered 2017-09-07 – 2017-09-08 (×2): 75 ug via ORAL
  Filled 2017-09-06 (×2): qty 1

## 2017-09-06 MED ORDER — IBUPROFEN 600 MG PO TABS: 600.0000 mg | ORAL_TABLET | Freq: Four times a day (QID) | ORAL | Status: DC

## 2017-09-06 MED ORDER — SIMETHICONE 80 MG PO CHEW
80.0000 mg | CHEWABLE_TABLET | Freq: Three times a day (TID) | ORAL | Status: DC
  Administered 2017-09-07 – 2017-09-08 (×4): 80 mg via ORAL
  Filled 2017-09-06 (×4): qty 1

## 2017-09-06 MED ORDER — NALBUPHINE HCL 10 MG/ML IJ SOLN: 5.0000 mg | INTRAMUSCULAR | Status: DC | PRN

## 2017-09-06 MED ORDER — OXYTOCIN 40 UNITS IN LACTATED RINGERS INFUSION - SIMPLE MED
2.5000 [IU]/h | INTRAVENOUS | Status: AC
  Administered 2017-09-06: 2.5 [IU]/h via INTRAVENOUS
  Filled 2017-09-06: qty 1000

## 2017-09-06 MED ORDER — CARBOPROST TROMETHAMINE 250 MCG/ML IM SOLN
INTRAMUSCULAR | Status: AC
  Filled 2017-09-06: qty 1

## 2017-09-06 MED ORDER — PHENYLEPHRINE 40 MCG/ML (10ML) SYRINGE FOR IV PUSH (FOR BLOOD PRESSURE SUPPORT): 80.0000 ug | PREFILLED_SYRINGE | INTRAVENOUS | Status: DC | PRN

## 2017-09-06 MED ORDER — STERILE WATER FOR INJECTION IJ SOLN
INTRAMUSCULAR | Status: AC
  Filled 2017-09-06: qty 50

## 2017-09-06 MED ORDER — EPHEDRINE 5 MG/ML INJ: 10.0000 mg | INTRAVENOUS | Status: DC | PRN

## 2017-09-06 MED ORDER — DIPHENHYDRAMINE HCL 50 MG/ML IJ SOLN: 12.5000 mg | INTRAMUSCULAR | Status: DC | PRN

## 2017-09-06 MED ORDER — OXYCODONE-ACETAMINOPHEN 5-325 MG PO TABS
1.0000 | ORAL_TABLET | ORAL | Status: DC | PRN
  Administered 2017-09-07 – 2017-09-08 (×3): 1 via ORAL
  Filled 2017-09-06 (×3): qty 1

## 2017-09-06 MED ORDER — NALOXONE HCL 0.4 MG/ML IJ SOLN: 0.4000 mg | INTRAMUSCULAR | Status: DC | PRN

## 2017-09-06 MED ORDER — ONDANSETRON HCL 4 MG/2ML IJ SOLN: 4.0000 mg | Freq: Three times a day (TID) | INTRAMUSCULAR | Status: DC | PRN

## 2017-09-06 MED ORDER — NALBUPHINE HCL 10 MG/ML IJ SOLN: 5.0000 mg | Freq: Once | INTRAMUSCULAR | Status: DC | PRN

## 2017-09-06 MED ORDER — OXYCODONE-ACETAMINOPHEN 5-325 MG PO TABS: 2.0000 | ORAL_TABLET | ORAL | Status: DC | PRN

## 2017-09-06 MED ORDER — KETOROLAC TROMETHAMINE 30 MG/ML IJ SOLN
30.0000 mg | Freq: Four times a day (QID) | INTRAMUSCULAR | Status: DC
  Administered 2017-09-06: 30 mg via INTRAVENOUS

## 2017-09-06 MED ORDER — LACTATED RINGERS IV SOLN: 500.0000 mL | Freq: Once | INTRAVENOUS | Status: DC

## 2017-09-06 MED ORDER — OXYCODONE HCL 5 MG PO TABS: 10.0000 mg | ORAL_TABLET | ORAL | Status: DC | PRN

## 2017-09-06 MED ORDER — FENTANYL 2.5 MCG/ML W/ROPIVACAINE 0.15% IN NS 100 ML EPIDURAL (ARMC)
12.0000 mL/h | EPIDURAL | Status: DC
  Administered 2017-09-06 (×2): 12 mL/h via EPIDURAL
  Filled 2017-09-06: qty 100

## 2017-09-06 MED ORDER — SODIUM CHLORIDE 0.9 % IV SOLN
INTRAVENOUS | Status: DC | PRN
  Administered 2017-09-06 (×3): 5 mL via EPIDURAL

## 2017-09-06 MED ORDER — SENNOSIDES-DOCUSATE SODIUM 8.6-50 MG PO TABS
2.0000 | ORAL_TABLET | ORAL | Status: DC
  Administered 2017-09-07 – 2017-09-08 (×2): 2 via ORAL
  Filled 2017-09-06 (×3): qty 2

## 2017-09-06 MED ORDER — MORPHINE SULFATE (PF) 0.5 MG/ML IJ SOLN
INTRAMUSCULAR | Status: AC
  Filled 2017-09-06: qty 10

## 2017-09-06 MED ORDER — OXYCODONE HCL 5 MG PO TABS: 5.0000 mg | ORAL_TABLET | ORAL | Status: DC | PRN

## 2017-09-06 MED ORDER — SIMETHICONE 80 MG PO CHEW: 80.0000 mg | CHEWABLE_TABLET | ORAL | Status: DC | PRN

## 2017-09-06 MED ORDER — LIDOCAINE-EPINEPHRINE (PF) 1.5 %-1:200000 IJ SOLN
INTRAMUSCULAR | Status: DC | PRN
  Administered 2017-09-06: 3 mL

## 2017-09-06 MED ORDER — SOD CITRATE-CITRIC ACID 500-334 MG/5ML PO SOLN: 30.0000 mL | ORAL | Status: DC

## 2017-09-06 MED ORDER — ACETAMINOPHEN 325 MG PO TABS: 650.0000 mg | ORAL_TABLET | Freq: Four times a day (QID) | ORAL | Status: DC

## 2017-09-06 MED ORDER — ONDANSETRON HCL 4 MG/2ML IJ SOLN
INTRAMUSCULAR | Status: DC | PRN
  Administered 2017-09-06: 4 mg via INTRAVENOUS

## 2017-09-06 MED ORDER — PANTOPRAZOLE SODIUM 40 MG PO TBEC
40.0000 mg | DELAYED_RELEASE_TABLET | Freq: Every day | ORAL | Status: DC
Start: 2017-09-06 — End: 2017-09-08
  Administered 2017-09-06 – 2017-09-08 (×3): 40 mg via ORAL
  Filled 2017-09-06 (×4): qty 1

## 2017-09-06 MED ORDER — BUPIVACAINE ON-Q PAIN PUMP (FOR ORDER SET NO CHG)
INJECTION | Status: DC
  Filled 2017-09-06: qty 1

## 2017-09-06 MED ORDER — BUPIVACAINE HCL 0.5 % IJ SOLN
20.0000 mL | INTRAMUSCULAR | Status: DC
  Filled 2017-09-06: qty 20

## 2017-09-06 MED ORDER — SODIUM CHLORIDE 0.9% FLUSH: 3.0000 mL | INTRAVENOUS | Status: DC | PRN

## 2017-09-06 MED ORDER — FENTANYL 2.5 MCG/ML W/ROPIVACAINE 0.15% IN NS 100 ML EPIDURAL (ARMC)
EPIDURAL | Status: AC
  Filled 2017-09-06: qty 100

## 2017-09-06 MED ORDER — AZITHROMYCIN 500 MG IV SOLR
500.0000 mg | Freq: Once | INTRAVENOUS | Status: AC
  Administered 2017-09-06: 500 mg via INTRAVENOUS
  Filled 2017-09-06: qty 500

## 2017-09-06 MED ORDER — DIPHENHYDRAMINE HCL 25 MG PO CAPS: 25.0000 mg | ORAL_CAPSULE | Freq: Four times a day (QID) | ORAL | Status: DC | PRN

## 2017-09-06 MED ORDER — BUPIVACAINE 0.25 % ON-Q PUMP DUAL CATH 400 ML
400.0000 mL | INJECTION | Status: DC
  Filled 2017-09-06: qty 400

## 2017-09-06 MED ORDER — DIPHENHYDRAMINE HCL 25 MG PO CAPS: 25.0000 mg | ORAL_CAPSULE | ORAL | Status: DC | PRN

## 2017-09-06 MED ORDER — FENTANYL CITRATE (PF) 100 MCG/2ML IJ SOLN
INTRAMUSCULAR | Status: AC
  Filled 2017-09-06: qty 2

## 2017-09-06 MED ORDER — SIMETHICONE 80 MG PO CHEW: 80.0000 mg | CHEWABLE_TABLET | ORAL | Status: DC

## 2017-09-06 MED ORDER — SOD CITRATE-CITRIC ACID 500-334 MG/5ML PO SOLN
ORAL | Status: AC
  Administered 2017-09-06: 30 mL
  Filled 2017-09-06: qty 15

## 2017-09-06 MED ORDER — BUPIVACAINE HCL (PF) 0.5 % IJ SOLN
INTRAMUSCULAR | Status: DC | PRN
  Administered 2017-09-06: 10 mL

## 2017-09-06 MED ORDER — MEPERIDINE HCL 50 MG/ML IJ SOLN: 6.2500 mg | INTRAMUSCULAR | Status: DC | PRN

## 2017-09-06 MED ORDER — ONDANSETRON HCL 4 MG/2ML IJ SOLN
INTRAMUSCULAR | Status: AC
  Filled 2017-09-06: qty 2

## 2017-09-06 MED ORDER — LIDOCAINE-EPINEPHRINE (PF) 2 %-1:200000 IJ SOLN
INTRAMUSCULAR | Status: DC | PRN
  Administered 2017-09-06: 3 mL via INTRADERMAL
  Administered 2017-09-06 (×2): 5 mL via INTRADERMAL

## 2017-09-06 MED ORDER — MORPHINE SULFATE (PF) 2 MG/ML IV SOLN
2.0000 mg | Freq: Once | INTRAVENOUS | Status: AC
  Administered 2017-09-06: 2 mg via INTRAVENOUS
  Filled 2017-09-06: qty 1

## 2017-09-06 SURGICAL SUPPLY — 34 items
ADH SKN CLS APL DERMABOND .7 (GAUZE/BANDAGES/DRESSINGS) ×3
BAG COUNTER SPONGE EZ (MISCELLANEOUS) ×2 IMPLANT
BAG SPNG 4X4 CLR HAZ (MISCELLANEOUS) ×1
CANISTER SUCT 3000ML PPV (MISCELLANEOUS) ×3 IMPLANT
CATH KIT ON-Q SILVERSOAK 5 (CATHETERS) ×2 IMPLANT
CATH KIT ON-Q SILVERSOAK 5IN (CATHETERS) ×6 IMPLANT
CHLORAPREP W/TINT 26ML (MISCELLANEOUS) ×6 IMPLANT
CLOSURE WOUND 1/2 X4 (GAUZE/BANDAGES/DRESSINGS) ×1
COUNTER SPONGE BAG EZ (MISCELLANEOUS) ×1
DERMABOND ADVANCED (GAUZE/BANDAGES/DRESSINGS) ×6
DERMABOND ADVANCED .7 DNX12 (GAUZE/BANDAGES/DRESSINGS) ×1 IMPLANT
DRSG OPSITE POSTOP 4X10 (GAUZE/BANDAGES/DRESSINGS) ×5 IMPLANT
DRSG TELFA 3X8 NADH (GAUZE/BANDAGES/DRESSINGS) IMPLANT
ELECT CAUTERY BLADE 6.4 (BLADE) ×3 IMPLANT
ELECT REM PT RETURN 9FT ADLT (ELECTROSURGICAL) ×3
ELECTRODE REM PT RTRN 9FT ADLT (ELECTROSURGICAL) ×1 IMPLANT
GAUZE SPONGE 4X4 12PLY STRL (GAUZE/BANDAGES/DRESSINGS) ×1 IMPLANT
GLOVE BIO SURGEON STRL SZ7 (GLOVE) ×7 IMPLANT
GLOVE INDICATOR 7.5 STRL GRN (GLOVE) ×7 IMPLANT
GOWN STRL REUS W/ TWL LRG LVL3 (GOWN DISPOSABLE) ×3 IMPLANT
GOWN STRL REUS W/TWL LRG LVL3 (GOWN DISPOSABLE) ×9
NS IRRIG 1000ML POUR BTL (IV SOLUTION) ×3 IMPLANT
PACK C SECTION AR (MISCELLANEOUS) ×3 IMPLANT
PAD DRESSING TELFA 3X8 NADH (GAUZE/BANDAGES/DRESSINGS) ×1 IMPLANT
PAD OB MATERNITY 4.3X12.25 (PERSONAL CARE ITEMS) ×3 IMPLANT
PAD PREP 24X41 OB/GYN DISP (PERSONAL CARE ITEMS) ×3 IMPLANT
SPONGE LAP 18X18 5 PK (GAUZE/BANDAGES/DRESSINGS) ×2 IMPLANT
STAPLER INSORB 30 2030 C-SECTI (MISCELLANEOUS) ×2 IMPLANT
STRIP CLOSURE SKIN 1/2X4 (GAUZE/BANDAGES/DRESSINGS) ×2 IMPLANT
SUT MNCRL AB 4-0 PS2 18 (SUTURE) ×3 IMPLANT
SUT PDS AB 1 TP1 96 (SUTURE) ×6 IMPLANT
SUT VIC AB 0 CTX 36 (SUTURE) ×12
SUT VIC AB 0 CTX36XBRD ANBCTRL (SUTURE) ×2 IMPLANT
SUT VIC AB 2-0 CT1 36 (SUTURE) ×3 IMPLANT

## 2017-09-06 NOTE — Progress Notes (Addendum)
   Subjective:  Comfortable with epidural in place  Objective:   Vitals: Blood pressure 131/82, pulse 81, temperature 99.1 F (37.3 C), temperature source Oral, resp. rate 16, height 5\' 3"  (1.6 m), weight 213 lb (96.6 kg), last menstrual period 11/23/2016, SpO2 96 %. General:  Abdomen: Cervical Exam:  Dilation: 10 Dilation Complete Date: 09/06/17 Dilation Complete Time: 1836 Effacement (%): 100 Cervical Position: Middle, Anterior Station: 0, Plus 1 Presentation: Vertex Exam by:: Schmid Position confirmed to be ROA  FHT: 150, moderate, no accels, recurrent late with pushing (resolves with cessation of pushing) Toco: q47min  No results found for this or any previous visit (from the past 24 hour(s)).  Assessment:   37 y.o. G2P0010 [redacted]w[redacted]d postdates IOL displaying fetal intolerance to labor  Plan:   1) Fetal intolerance to labor - The patient was counseled regarding risk and benefits to proceeding with Cesarean section to expedite delivery.  Risk of cesarean section were discussed including risk of bleeding and need for potential intraoperative or postoperative blood transfusion with a rate of approximately 5% quoted for all Cesarean sections, risk of injury to adjacent organs including but not limited to bowl and bladder, the need for additional surgical procedures to address such injuries, and the risk of infection.  The risk of continued attempts at vaginal delivery include but are note limited to worsening fetal or maternal status.  Given that she has an untested pelvis, failed to make progress after 1-hr of laboring down and 40 minutes of pushing I believe we are remote from delivery.  In addition the patient now has had a one time temperature of 100.7F.  Fetal station is to high for trial of forceps or vacuum.  After consideration of options the patient is amenable to proceed with primary cesarean section for delivery.  2) Fetus - cat II tracing  Malachy Mood, MD,  [facility] 09/06/2017, 8:34 PM

## 2017-09-06 NOTE — Progress Notes (Signed)
  Labor Progress Note   37 y.o. G2P0010 @ [redacted]w[redacted]d , admitted for  Pregnancy, Labor Management. PDIOL.  Subjective:  Uncomfortable with contractions. Desires epidural.  Objective:  BP 137/80 (BP Location: Left Arm)   Pulse 70   Temp 98.3 F (36.8 C) (Oral)   Resp 18   Ht 5\' 3"  (1.6 m)   Wt 213 lb (96.6 kg)   LMP 11/23/2016 (Approximate)   SpO2 97%   BMI 37.73 kg/m  Abd: moderate Extr: trace to 1+ bilateral pedal edema SVE: 3.5/80/-2 (RN exam)  EFM: FHR: 125 bpm, variability: moderate,  accelerations:  Present,  decelerations:  Present Variable Toco: Frequency: Every 4 minutes, duration 60-80 seconds, moderate intensity   Assessment & Plan:  G2P0010 @ [redacted]w[redacted]d, admitted for  Pregnancy and Labor/Delivery Management  1. Pain management: IV medication, plans epidural. 2. FWB: FHT category II.  3. ID: GBS positive 4. Labor management: Foley bulb fell out, will start Pitocin after she is comfortable with epidural.  Avel Sensor, CNM 09/06/2017  8:03 AM

## 2017-09-06 NOTE — Transfer of Care (Signed)
Immediate Anesthesia Transfer of Care Note  Patient: Jenny Hendrix  Procedure(s) Performed: CESAREAN SECTION (N/A Abdomen)  Patient Location: PACU and Mother/Baby  Anesthesia Type:Epidural  Level of Consciousness: awake and alert  Airway & Oxygen Therapy: Patient Spontanous Breathing and Patient connected to nasal cannula oxygen  Post-op Assessment: Report given to RN and Post -op Vital signs reviewed and stable  Post vital signs: Reviewed and stable  Last Vitals:  Vitals Value Taken Time  BP 104/68 09/06/2017 10:16 PM  Temp    Pulse 78 09/06/2017 10:16 PM  Resp 18 09/06/2017 10:16 PM  SpO2 98 % 09/06/2017 10:16 PM    Last Pain:  Vitals:   09/06/17 1910  TempSrc: Oral  PainSc: 3       Patients Stated Pain Goal: 1 (22/24/11 4643)  Complications: No apparent anesthesia complications

## 2017-09-06 NOTE — Discharge Summary (Signed)
Obstetric Discharge Summary Reason for Admission: induction of labor Prenatal Procedures: none Intrapartum Procedures: cesarean: low cervical, transverse fetal intolerance to labor Postpartum Procedures: none Complications-Operative and Postpartum: none Hemoglobin  Date Value Ref Range Status  09/07/2017 10.5 (L) 12.0 - 16.0 g/dL Final  06/11/2017 10.5 (L) 11.1 - 15.9 g/dL Final  05/22/2016 13.2 g/dL Final   HCT  Date Value Ref Range Status  09/07/2017 31.3 (L) 35.0 - 47.0 % Final  05/22/2016 40 % Final   Hematocrit  Date Value Ref Range Status  06/11/2017 32.5 (L) 34.0 - 46.6 % Final    Physical Exam:  General: alert, cooperative and no distress Lochia: appropriate Uterine Fundus: firm Incision: healing well, no significant drainage, no dehiscence, no significant erythema DVT Evaluation: No evidence of DVT seen on physical exam. No cords or calf tenderness. Calf/Ankle edema is present.  Discharge Diagnoses: Term Pregnancy-delivered  Discharge Information: Date: 09/08/2017 Activity: pelvic rest and no lifting >10lbs, no driving on prescription narcotics Diet: routine   Allergies as of 09/08/2017      Reactions   Benzonatate Diarrhea   Metronidazole Other (See Comments)   Body aches, all side effects       Medication List    STOP taking these medications   ferrous sulfate 325 (65 FE) MG tablet Commonly known as:  FERROUSUL   ondansetron 4 MG tablet Commonly known as:  ZOFRAN   promethazine 25 MG tablet Commonly known as:  PHENERGAN   valACYclovir 500 MG tablet Commonly known as:  VALTREX     TAKE these medications   docusate sodium 100 MG capsule Commonly known as:  COLACE Take 1 capsule (100 mg total) by mouth 2 (two) times daily as needed.   levothyroxine 75 MCG tablet Commonly known as:  SYNTHROID Take 1 tablet (75 mcg total) by mouth daily before breakfast.   multivitamin-prenatal 27-0.8 MG Tabs tablet Take 1 tablet by mouth daily at 12 noon.    omeprazole 40 MG capsule Commonly known as:  PRILOSEC Take 20 mg by mouth daily.   oxyCODONE-acetaminophen 5-325 MG tablet Commonly known as:  PERCOCET/ROXICET Take 1-2 tablets by mouth every 6 (six) hours as needed.   sertraline 50 MG tablet Commonly known as:  ZOLOFT Take 1 tablet (50 mg total) by mouth daily.            Discharge Care Instructions  (From admission, onward)        Start     Ordered   09/08/17 0000  Discharge wound care:    Comments:  You may apply a light dressing for minor discharge from the incision or to keep waistbands of clothing from rubbing.  You may also have been discharge with a clear dressing in which case this will be removed at your postoperative clinic visit.  You may shower, use soap on your incision.  Avoid baths or soaking the incision in the first 6 weeks following your surgery.Marland Kitchen   09/08/17 0950      Condition: stable Discharge to: home Follow-up Information    Malachy Mood, MD In 1 week.   Specialty:  Obstetrics and Gynecology Why:  For wound re-check Contact information: [place] [address] [phone]           Newborn Data: Live born female: Tera Partridge Birth Weight: 8 lb 1.1 oz (3660 g) APGAR: 6, 8  Newborn Delivery   Birth date/time:  09/06/2017 21:17:00 Delivery type:  C-Section, Low Transverse Trial of labor:  Yes C-section  categorization:  Primary     Home with mother.  Rexene Agent 09/08/2017, 9:52 AM

## 2017-09-06 NOTE — Op Note (Signed)
Preoperative Diagnosis: 1) 37 y.o. G2P1011 at [redacted]w[redacted]d 2) Fetal intolerance to labor  Postoperative Diagnosis: 1) 37 y.o. G2P1011 at [redacted]w[redacted]d 2) Fetal intolerance to labor 3) Meconium  Operation Performed: Primary low transverse C-section via pfannenstiel skin incision  Indication: Fetal intolerance to labor, post dates IOL, progressed to 10cm and began displaying recurrent late deceleration with pushing  Anesthesia: Epidural  Primary Surgeon: Malachy Mood, MD  Assistant: Donia Pounds, CNM  Preoperative Antibiotics: 2g ancef, 500mg  of azithromyin  Estimated Blood Loss: 800 mL  IV Fluids: 1262mL  Urine Output:: 886mL  Drains or Tubes: Foley to gravity drainage, ON-Q catheter system  Implants: none  Specimens Removed: none  Complications: none  Intraoperative Findings:  Normal tubes ovaries and uterus.  Delivery resulted in the birth of a liveborn female, APGAR (1 MIN): 6   APGAR (5 MINS): 8, weight 8lbs 1oz  Patient Condition: stable  Procedure in Detail:  Patient was taken to the operating room were she was administered regional anesthesia.  She was positioned in the supine position, prepped and draped in the  Usual sterile fashion.  Prior to proceeding with the case a time out was performed and the level of anesthetic was checked and noted to be adequate.  Utilizing the scalpel a pfannenstiel skin incision was made 2cm above the pubic symphysis and carried down sharply to the the level of the rectus fascia.  The fascia was incised in the midline using the scalpel and then extended using mayo scissors.  The superior border of the rectus fascia was grasped with two Kocher clamps and the underlying rectus muscles were dissected of the fascia using blunt dissection.  The median raphae was incised using Mayo scissors.   The inferior border of the rectus fascia was dissected of the rectus muscles in a similar fashion.  The midline was identified, the peritoneum was entered bluntly  and expanded using manual tractions.  The uterus was noted to be in a none rotated position.  Next the bladder blade was placed retracting the bladder caudally.  A bladder flap was not created.  A low transverse incision was scored on the lower uterine segment.  The hysterotomy was entered bluntly using the operators finger.  Thick meconium was noted on entering the uterus.  The hysterotomy incision was extended using manual traction.  The operators hand was placed within the hysterotomy position noting the fetus to be within the ROA position.  The vertex was grasped, flexed, brought to the incision, and delivered a traumatically using fundal pressure.  The remainder of the body delivered with ease.  The infant was suctioned, cord was clamped and cut before handing off to the awaiting neonatologist.  The placenta was delivered using manual extraction.  The uterus was exteriorized, wiped clean of clots and debris using two moist laps.  The hysterotomy was closed using a two layer closure of 0 Vicryl, with the first being a running locked, the second a vertical imbricating.  There was a right lower uterine/cervical extension which was oversown.  There were no palpable defects following hysterotomy closure.  The uterus was returned to the abdomen.  The peritoneal gutters were wiped clean of clots and debris using two moist laps.  The hysterotomy incision was re-inspected noted to be hemostatic.  The rectus muscles were inspected noted to be hemostatic.  The superior border of the rectus fascia was grasped with a Kocher clamp.  The ON-Q trocars were then placed 4cm above the superior border of the incision and  tunneled subfascially.  The introducers were removed and the catheters were threaded through the sleeves after which the sleeves were removed.  The fascia was closed using a looped #1 PDS in a running fashion taking 1cm by 1cm bites.  The subcutaneous tissue was irrigated using warm saline, hemostasis achieved  using the bovie.  The subcutaneous dead space was less than 3cm and was not closed.  The skin was closed using Insorb staples.  Sponge needle and instrument counts were corrects times two.  The patient tolerated the procedure well and was taken to the recovery room in stable condition.

## 2017-09-06 NOTE — Anesthesia Post-op Follow-up Note (Signed)
Anesthesia QCDR form completed.        

## 2017-09-06 NOTE — Progress Notes (Signed)
  Labor Progress Note   37 y.o. G2P0010 @ [redacted]w[redacted]d , admitted for  Pregnancy, Labor Management. PDIOL.  Subjective:  Occasional nausea. Comfortable with epidural.  Objective:  BP (!) 103/55 (BP Location: Left Arm)   Pulse (!) 58   Temp 98.1 F (36.7 C) (Oral)   Resp 16   Ht 5\' 3"  (1.6 m)   Wt 213 lb (96.6 kg)   LMP 11/23/2016 (Approximate)   SpO2 99%   BMI 37.73 kg/m  Abd: mild Extr: trace to 1+ bilateral pedal edema SVE: 4/90/-2, small bloody show. AROM of forebag with scant fluid.  EFM: FHR: 125 bpm, variability: moderate,  accelerations:  Present,  decelerations:  Present Variable Toco: Frequency: Every 2.5-4 minutes, Duration: 60-80 seconds and Intensity: moderate    Assessment & Plan:  G2P0010 @ [redacted]w[redacted]d, admitted for  Pregnancy and Labor/Delivery Management, PDIOL.  1. Pain management: epidural. 2. FWB: FHT category II.  3. ID: GBS positive, continue antibiotics 4. Labor management: Variable decelerations improved with fluid bolus, position change. Continue Pitocin.  All discussed with patient.  Avel Sensor, CNM 09/06/2017  2:17 PM

## 2017-09-06 NOTE — Anesthesia Procedure Notes (Signed)
Epidural Patient location during procedure: OB Start time: 09/06/2017 8:02 AM End time: 09/06/2017 8:21 AM  Staffing Anesthesiologist: Emmie Niemann, MD Performed: anesthesiologist   Preanesthetic Checklist Completed: patient identified, site marked, surgical consent, pre-op evaluation, timeout performed, IV checked, risks and benefits discussed and monitors and equipment checked  Epidural Patient position: sitting Prep: ChloraPrep Patient monitoring: heart rate, continuous pulse ox and blood pressure Approach: midline Location: L3-L4 Injection technique: LOR saline  Needle:  Needle type: Tuohy  Needle gauge: 18 G Needle length: 9 cm and 9 Needle insertion depth: 5 cm Catheter type: closed end flexible Catheter size: 20 Guage Catheter at skin depth: 10 cm Test dose: negative (0.125% bupivacaine)  Assessment Events: blood not aspirated, injection not painful, no injection resistance, negative IV test and no paresthesia  Additional Notes   Patient tolerated the insertion well without complications.Reason for block:procedure for pain

## 2017-09-06 NOTE — Progress Notes (Signed)
  Labor Progress Note   37 y.o. G2P0010 @ [redacted]w[redacted]d , admitted for  Pregnancy, Labor Management. PDIOL  Subjective:  States contractions are moderately painful. Has been able to rest for a few hours.  Objective:  BP 134/86 (BP Location: Left Arm)   Pulse 75   Temp 98.4 F (36.9 C) (Oral)   Resp 16   Ht 5\' 3"  (1.6 m)   Wt 213 lb (96.6 kg)   LMP 11/23/2016 (Approximate)   SpO2 99%   BMI 37.73 kg/m  Abd: mild to moderate Extr: trace to 1+ bilateral pedal edema SVE: 1/50/-3  EFM: called to room with 2 variable deceleration to 80s-90s. Baby with good recovery after change in position and fluid bolus. Baseline 125, moderate variability, accelerations present Toco: Frequency: Every 3-5 minutes, Duration: 80-100 seconds and Intensity: mild to moderate   Assessment & Plan:  G2P0010 @ [redacted]w[redacted]d, admitted for  Pregnancy and Labor/Delivery Management. PDIOL.  1. Pain management: patient requested medication for sleep/pain management, declines Ambien. Ordered IV morphine once. 2. FWB: FHT category 2.  3. ID: GBS positive 4. Labor management: Foley bulb placed, defer further uterine stimulants at this time to assess contraction pattern and FWB.  All discussed with patient, see orders.  Avel Sensor, CNM 09/06/2017  1:30 AM

## 2017-09-06 NOTE — Anesthesia Preprocedure Evaluation (Signed)
Anesthesia Evaluation  Patient identified by MRN, date of birth, ID band Patient awake    Reviewed: Allergy & Precautions, NPO status , Patient's Chart, lab work & pertinent test results  History of Anesthesia Complications Negative for: history of anesthetic complications  Airway Mallampati: III  TM Distance: >3 FB Neck ROM: Full    Dental no notable dental hx.    Pulmonary neg pulmonary ROS, neg sleep apnea, neg COPD,    breath sounds clear to auscultation- rhonchi (-) wheezing      Cardiovascular Exercise Tolerance: Good (-) hypertension(-) CAD, (-) Past MI, (-) Cardiac Stents and (-) CABG  Rhythm:Regular Rate:Normal - Systolic murmurs and - Diastolic murmurs    Neuro/Psych Anxiety negative neurological ROS     GI/Hepatic Neg liver ROS, GERD  ,  Endo/Other  neg diabetesHypothyroidism   Renal/GU negative Renal ROS     Musculoskeletal negative musculoskeletal ROS (+)   Abdominal (+) + obese, Gravid abdomen  Peds  Hematology negative hematology ROS (+)   Anesthesia Other Findings Past Medical History: No date: Anxiety 05/22/2016: ASCUS of cervix with negative high risk HPV No date: Hypothyroidism   Reproductive/Obstetrics (+) Pregnancy                             Lab Results  Component Value Date   WBC 11.4 (H) 09/05/2017   HGB 12.5 09/05/2017   HCT 37.6 09/05/2017   MCV 84.5 09/05/2017   PLT 261 09/05/2017    Anesthesia Physical Anesthesia Plan  ASA: II  Anesthesia Plan: Epidural   Post-op Pain Management:    Induction:   PONV Risk Score and Plan: 2  Airway Management Planned:   Additional Equipment:   Intra-op Plan:   Post-operative Plan:   Informed Consent: I have reviewed the patients History and Physical, chart, labs and discussed the procedure including the risks, benefits and alternatives for the proposed anesthesia with the patient or authorized  representative who has indicated his/her understanding and acceptance.     Plan Discussed with: CRNA and Anesthesiologist  Anesthesia Plan Comments: (Plan for epidural for labor, discussed epidural vs spinal vs GA if need for csection)        Anesthesia Quick Evaluation

## 2017-09-07 ENCOUNTER — Encounter: Payer: Self-pay | Admitting: Obstetrics and Gynecology

## 2017-09-07 LAB — CBC
HEMATOCRIT: 31.3 % — AB (ref 35.0–47.0)
HEMOGLOBIN: 10.5 g/dL — AB (ref 12.0–16.0)
MCH: 28.6 pg (ref 26.0–34.0)
MCHC: 33.6 g/dL (ref 32.0–36.0)
MCV: 85 fL (ref 80.0–100.0)
Platelets: 249 10*3/uL (ref 150–440)
RBC: 3.68 MIL/uL — AB (ref 3.80–5.20)
RDW: 14.7 % — ABNORMAL HIGH (ref 11.5–14.5)
WBC: 23.1 10*3/uL — AB (ref 3.6–11.0)

## 2017-09-07 LAB — RPR

## 2017-09-07 MED ORDER — DIBUCAINE 1 % RE OINT: 1.0000 "application " | TOPICAL_OINTMENT | RECTAL | Status: DC | PRN

## 2017-09-07 MED ORDER — MENTHOL 3 MG MT LOZG
1.0000 | LOZENGE | OROMUCOSAL | Status: DC | PRN
Start: 2017-09-07 — End: 2017-09-08
  Filled 2017-09-07: qty 9

## 2017-09-07 MED ORDER — DOCUSATE SODIUM 100 MG PO CAPS
100.0000 mg | ORAL_CAPSULE | Freq: Two times a day (BID) | ORAL | Status: DC | PRN
  Filled 2017-09-07: qty 1

## 2017-09-07 MED ORDER — LACTATED RINGERS IV SOLN
INTRAVENOUS | Status: DC
  Administered 2017-09-07: 10:00:00 via INTRAVENOUS

## 2017-09-07 MED ORDER — COCONUT OIL OIL
1.0000 "application " | TOPICAL_OIL | Status: DC | PRN
  Administered 2017-09-08: 1 via TOPICAL
  Filled 2017-09-07 (×2): qty 120

## 2017-09-07 MED ORDER — IBUPROFEN 600 MG PO TABS
600.0000 mg | ORAL_TABLET | Freq: Four times a day (QID) | ORAL | Status: DC | PRN
  Administered 2017-09-07 – 2017-09-08 (×5): 600 mg via ORAL
  Filled 2017-09-07 (×5): qty 1

## 2017-09-07 MED ORDER — PRENATAL MULTIVITAMIN CH
1.0000 | ORAL_TABLET | Freq: Every day | ORAL | Status: DC
  Administered 2017-09-07 – 2017-09-08 (×2): 1 via ORAL
  Filled 2017-09-07 (×2): qty 1

## 2017-09-07 MED ORDER — LACTATED RINGERS IV SOLN: INTRAVENOUS | Status: DC

## 2017-09-07 MED ORDER — WITCH HAZEL-GLYCERIN EX PADS: 1.0000 "application " | MEDICATED_PAD | CUTANEOUS | Status: DC | PRN

## 2017-09-07 NOTE — Progress Notes (Signed)
Patients honeycomb dressing came half way off in the shower, went ahead and removed the rest of the dressing. incision looks great, no drainage. Spoke with Jenny Hendrix about putting a new one on or leaving it open to air, stated as long as it looks ok and no problems arise we can leave it open to air.

## 2017-09-07 NOTE — Progress Notes (Signed)
  Subjective:   Post Op Day 1  Doing well, no complaints. Foley catheter still in place. SCDs still on. Tolerating PO intake. Tolerating pain with PO medications and On Q pump. Patient reports baby has good latch. She is supplementing with formula. Explained the importance of frequent breast stimulation for milk production.   Objective:  Blood pressure 103/64, pulse (!) 59, temperature 97.9 F (36.6 C), temperature source Oral, resp. rate 18, height 5\' 3"  (1.6 m), weight 213 lb (96.6 kg), last menstrual period 11/23/2016, SpO2 100 %, currently breastfeeding.  General: NAD Pulmonary: no increased work of breathing Abdomen: non-distended, non-tender, fundus firm at level of umbilicus Incision: honeycomb dressing is C/D/I. On Q pump is intact. Extremities: no edema, no erythema, no tenderness  Results for orders placed or performed during the hospital encounter of 09/05/17 (from the past 24 hour(s))  CBC     Status: Abnormal   Collection Time: 09/07/17  4:35 AM  Result Value Ref Range   WBC 23.1 (H) 3.6 - 11.0 K/uL   RBC 3.68 (L) 3.80 - 5.20 MIL/uL   Hemoglobin 10.5 (L) 12.0 - 16.0 g/dL   HCT 31.3 (L) 35.0 - 47.0 %   MCV 85.0 80.0 - 100.0 fL   MCH 28.6 26.0 - 34.0 pg   MCHC 33.6 32.0 - 36.0 g/dL   RDW 14.7 (H) 11.5 - 14.5 %   Platelets 249 150 - 440 K/uL    Intake/Output Summary (Last 24 hours) at 09/07/2017 1001 Last data filed at 09/07/2017 0055 Gross per 24 hour  Intake -  Output 1575 ml  Net -1575 ml  Urine appears to have some blood   Assessment:   37 y.o. G2P1011 postoperativeday # 1   Plan:  1) Acute blood loss anemia - hemodynamically stable and asymptomatic - po ferrous sulfate  2) A positive, Rubella Immune, Varicella Immune  3) TDAP status: given antepartum  4) Breast/Bottle/Contraception: plans Mirena  5) Disposition: day of discharge pending postpartum progress   Rod Can, CNM

## 2017-09-07 NOTE — Anesthesia Postprocedure Evaluation (Signed)
Anesthesia Post Note  Patient: Jenny Hendrix  Procedure(s) Performed: CESAREAN SECTION (N/A Abdomen)  Patient location during evaluation: Mother Baby Anesthesia Type: Epidural Level of consciousness: awake and alert and oriented Pain management: satisfactory to patient Vital Signs Assessment: post-procedure vital signs reviewed and stable Respiratory status: respiratory function stable Cardiovascular status: stable Postop Assessment: no backache, no headache, epidural receding, patient able to bend at knees, no apparent nausea or vomiting, able to ambulate and adequate PO intake Anesthetic complications: no     Last Vitals:  Vitals:   09/07/17 0304 09/07/17 0719  BP: 102/62 103/64  Pulse: 63 (!) 59  Resp: 18 18  Temp: 36.8 C 36.6 C  SpO2: 96% 100%    Last Pain:  Vitals:   09/07/17 0719  TempSrc: Oral  PainSc:                  Blima Singer

## 2017-09-07 NOTE — Lactation Note (Addendum)
This note was copied from a baby's chart. Lactation Consultation Note  Patient Name: Jenny Hendrix Today's Date: 09/07/2017 Reason for consult: Initial assessment;Primapara   Maternal Data Formula Feeding for Exclusion: No Has patient been taught Hand Expression?: Yes Does the patient have breastfeeding experience prior to this delivery?: No Mom reports she has a tongue tie, unable to assess well if baby has tongue tie, can extend tongue past gumline, not latching well to breast since delivery mostly due to flat nipples, gagging of  baby  Feeding Feeding Type: Breast Fed Length of feed: 20 min(10 min each breast)  LATCH Score Latch: Repeated attempts needed to sustain latch, nipple held in mouth throughout feeding, stimulation needed to elicit sucking reflex.  Audible Swallowing: A few with stimulation  Type of Nipple: Flat  Comfort (Breast/Nipple): Soft / non-tender  Hold (Positioning): Assistance needed to correctly position infant at breast and maintain latch.  LATCH Score: 6  Interventions Interventions: Breast feeding basics reviewed;Assisted with latch;Skin to skin;Hand express;Breast compression;Adjust position;Support pillows;Position options;Expressed milk;DEBP  Lactation Tools Discussed/Used Tools: Pump;Nipple Shields Nipple shield size: 24 Breast pump type: Double-Electric Breast Pump WIC Program: No Pump Review: Setup, frequency, and cleaning;Milk Storage Initiated by:: Chaya Jan RNC IBCLC Date initiated:: 09/07/17   Consult Status Consult Status: Follow-up Date: 09/07/17 Follow-up type: In-patient    Ferol Luz 09/07/2017, 1:51 PM

## 2017-09-07 NOTE — Lactation Note (Signed)
This note was copied from a baby's chart. Lactation Consultation Note  Patient Name: Jenny Hendrix [id number] Date: 09/07/2017 Reason for consult: Follow-up assessment;Primapara   Maternal Data Formula Feeding for Exclusion: No  Feeding Feeding Type: Breast Fed Length of feed: 20 min(10 min each breast) 7 ml gerber goodstart given at breast with curved tip syringe LATCH Score Latch: Repeated attempts needed to sustain latch, nipple held in mouth throughout feeding, stimulation needed to elicit sucking reflex.  Audible Swallowing: Spontaneous and intermittent  Type of Nipple: Flat  Comfort (Breast/Nipple): Soft / non-tender  Hold (Positioning): Assistance needed to correctly position infant at breast and maintain latch.  LATCH Score: 7  Interventions Interventions: Assisted with latch;Pre-pump if needed;Breast compression;Adjust position;Support pillows;Expressed milk;Coconut oil;DEBP  Lactation Tools Discussed/Used Tools: Pump;Nipple Shields;56F feeding tube / Syringe Nipple shield size: 24 Breast pump type: Double-Electric Breast Pump   Consult Status Consult Status: Follow-up Date: 09/08/17 Follow-up type: In-patient    Ferol Luz 09/07/2017, 7:00 PM

## 2017-09-08 MED ORDER — OXYCODONE-ACETAMINOPHEN 5-325 MG PO TABS: 1.0000 | ORAL_TABLET | Freq: Four times a day (QID) | ORAL | 0 refills | Status: DC | PRN

## 2017-09-08 MED ORDER — SERTRALINE HCL 25 MG PO TABS
50.0000 mg | ORAL_TABLET | Freq: Every day | ORAL | Status: DC
  Administered 2017-09-08: 50 mg via ORAL
  Filled 2017-09-08: qty 2

## 2017-09-08 NOTE — Progress Notes (Signed)
Patient discharged home with infant. Discharge instructions and prescriptions given and reviewed with patient. Patient verbalized understanding. Escorted out by auxillary.  

## 2017-09-11 ENCOUNTER — Telehealth: Payer: Self-pay

## 2017-09-11 NOTE — Telephone Encounter (Signed)
If she still has back pain I can see her Monday

## 2017-09-11 NOTE — Telephone Encounter (Signed)
Please advise 

## 2017-09-11 NOTE — Telephone Encounter (Signed)
Pt calling stating that she has been having a lot of lower back pain that makes it hard to walk and was told that if any of this started happening to call the office. Had C-Section 5 days ago with AMS. Tried to call pt, no answer. Please advise for when she calls back, or I will try to reach her again

## 2017-09-14 NOTE — Telephone Encounter (Signed)
See MyChart message I sent to patient

## 2017-09-17 ENCOUNTER — Ambulatory Visit: Payer: BLUE CROSS/BLUE SHIELD | Admitting: Obstetrics and Gynecology

## 2017-09-17 ENCOUNTER — Encounter: Payer: Self-pay | Admitting: Obstetrics and Gynecology

## 2017-09-17 VITALS — BP 122/80 | HR 84 | Ht 63.0 in | Wt 191.0 lb

## 2017-09-17 DIAGNOSIS — M545 Low back pain, unspecified: Secondary | ICD-10-CM

## 2017-09-17 DIAGNOSIS — Z09 Encounter for follow-up examination after completed treatment for conditions other than malignant neoplasm: Secondary | ICD-10-CM

## 2017-09-17 DIAGNOSIS — D62 Acute posthemorrhagic anemia: Secondary | ICD-10-CM

## 2017-09-17 DIAGNOSIS — E039 Hypothyroidism, unspecified: Secondary | ICD-10-CM

## 2017-09-17 MED ORDER — OXYCODONE-ACETAMINOPHEN 5-325 MG PO TABS: 1.0000 | ORAL_TABLET | Freq: Four times a day (QID) | ORAL | 0 refills | Status: DC | PRN

## 2017-09-17 NOTE — Progress Notes (Signed)
      Postoperative Follow-up Patient presents post op from primary LTCS 1weeks ago for second stage arrest.  Subjective: Patient reports some improvement in her preop symptoms. Eating a regular diet without difficulty. pain controlled with ibuprofen and percocet.  Mostly lumbago.  Activity: normal activities of daily living.  Objective: Blood pressure 122/80, pulse 84, height 5\' 3"  (1.6 m), weight 191 lb (86.6 kg), unknown if currently breastfeeding.  Gen: NAD HEENT: normocephalic, anicteric Pulmonary: CTAB Abdomen: soft, non-tender, non-distended, incision D/C/I  Admission on 09/05/2017, Discharged on 09/08/2017  Component Date Value Ref Range Status  . WBC 09/05/2017 11.4* 3.6 - 11.0 K/uL Final  . RBC 09/05/2017 4.44  3.80 - 5.20 MIL/uL Final  . Hemoglobin 09/05/2017 12.5  12.0 - 16.0 g/dL Final  . HCT 09/05/2017 37.6  35.0 - 47.0 % Final  . MCV 09/05/2017 84.5  80.0 - 100.0 fL Final  . MCH 09/05/2017 28.2  26.0 - 34.0 pg Final  . MCHC 09/05/2017 33.4  32.0 - 36.0 g/dL Final  . RDW 09/05/2017 15.0* 11.5 - 14.5 % Final  . Platelets 09/05/2017 261  150 - 440 K/uL Final   Performed at [facility], [address], [place], [place] [address]  . ABO/RH(D) 09/05/2017 A POS   Final  . Antibody Screen 09/05/2017 NEG   Final  . Sample Expiration 09/05/2017    Final                   Value:09/08/2017 Performed at [facility], [address], [place], [place] [address]   . RPR Ser Ql 09/05/2017 SPHEMO   Final   Comment: (NOTE) Specimen was too hemolyzed for analysis.      Notified Theda Belfast 09/07/2017 Performed At: [facility] [address] [place], [place] [id number] Rush Farmer MD [id number] Performed at [facility], [address] [place], [place] [address]   . GBS 08/04/2017 Positive   Final  . Gonorrhea 01/12/2017 Negative   Final  . Chlamydia 01/12/2017 Negative   Final  . WBC 09/07/2017 23.1* 3.6 - 11.0 K/uL Final  .  RBC 09/07/2017 3.68* 3.80 - 5.20 MIL/uL Final  . Hemoglobin 09/07/2017 10.5* 12.0 - 16.0 g/dL Final  . HCT 09/07/2017 31.3* 35.0 - 47.0 % Final  . MCV 09/07/2017 85.0  80.0 - 100.0 fL Final  . MCH 09/07/2017 28.6  26.0 - 34.0 pg Final  . MCHC 09/07/2017 33.6  32.0 - 36.0 g/dL Final  . RDW 09/07/2017 14.7* 11.5 - 14.5 % Final  . Platelets 09/07/2017 249  150 - 440 K/uL Final   Performed at [facility], [address] [place], [place] [address]    Assessment: 37 y.o. s/p 1LTCS stable  Plan: Patient has done well after surgery with no apparent complications.  I have discussed the post-operative course to date, and the expected progress moving forward.  The patient understands what complications to be concerned about.  I will see the patient in routine follow up, or sooner if needed.    Activity plan: No heavy lifting.  Will check CBC, CMP given continued back pain.  Also TSH given some anxiety noted to verify not over corrected.  Interested in [place] for postpartum contraception  Return in about 5 weeks (around 10/22/2017) for 6 week postpartum and Mirena IUD placement.    Malachy Mood, MD, [facility], [facility] 09/17/2017, 8:44 PM

## 2017-09-18 ENCOUNTER — Telehealth: Payer: Self-pay

## 2017-09-18 ENCOUNTER — Encounter (INDEPENDENT_AMBULATORY_CARE_PROVIDER_SITE_OTHER): Payer: Self-pay

## 2017-09-18 LAB — COMPREHENSIVE METABOLIC PANEL
ALBUMIN: 4.1 g/dL (ref 3.5–5.5)
ALK PHOS: 102 IU/L (ref 39–117)
ALT: 14 IU/L (ref 0–32)
AST: 15 IU/L (ref 0–40)
Albumin/Globulin Ratio: 1.5 (ref 1.2–2.2)
BUN / CREAT RATIO: 18 (ref 9–23)
BUN: 14 mg/dL (ref 6–20)
Bilirubin Total: <0.2 mg/dL (ref 0.0–1.2)
CALCIUM: 9.6 mg/dL (ref 8.7–10.2)
CO2: 22 mmol/L (ref 20–29)
CREATININE: 0.76 mg/dL (ref 0.57–1.00)
Chloride: 102 mmol/L (ref 96–106)
GFR calc Af Amer: 118 mL/min/{1.73_m2} (ref >59)
GFR, EST NON AFRICAN AMERICAN: 103 mL/min/{1.73_m2} (ref >59)
GLOBULIN, TOTAL: 2.8 g/dL (ref 1.5–4.5)
GLUCOSE: 72 mg/dL (ref 65–99)
Potassium: 4.7 mmol/L (ref 3.5–5.2)
SODIUM: 141 mmol/L (ref 134–144)
Total Protein: 6.9 g/dL (ref 6.0–8.5)

## 2017-09-18 LAB — CBC
Hematocrit: 36.5 % (ref 34.0–46.6)
Hemoglobin: 11.9 g/dL (ref 11.1–15.9)
MCH: 27.5 pg (ref 26.6–33.0)
MCHC: 32.6 g/dL (ref 31.5–35.7)
MCV: 84 fL (ref 79–97)
Platelets: 513 10*3/uL — ABNORMAL HIGH (ref 150–450)
RBC: 4.33 x10E6/uL (ref 3.77–5.28)
RDW: 14.9 % (ref 12.3–15.4)
WBC: 7.2 10*3/uL (ref 3.4–10.8)

## 2017-09-18 LAB — THYROID PANEL
Free Thyroxine Index: 3.2 (ref 1.2–4.9)
T3 Uptake Ratio: 27 % (ref 24–39)
T4 TOTAL: 11.9 ug/dL (ref 4.5–12.0)

## 2017-09-18 NOTE — Telephone Encounter (Signed)
Pt calling stating that she needs AMS to write note for her short term disability saying that she needs 8 weeks out of work instead of 6 weeks after C-Section. FAX # [phone], reference # [id number].

## 2017-09-22 NOTE — Telephone Encounter (Signed)
Please advise 

## 2017-09-22 NOTE — Telephone Encounter (Signed)
Pt calling again to get letter sent for disability to be 8 weeks because of her C-section. Please advise thank you!

## 2017-09-28 ENCOUNTER — Encounter: Payer: Self-pay | Admitting: Obstetrics and Gynecology

## 2017-09-28 NOTE — Telephone Encounter (Signed)
Its in her chart just needs to get faxed

## 2017-09-28 NOTE — Telephone Encounter (Signed)
If pt calls back please get fax number for note, or does she want to pick up

## 2017-09-28 NOTE — Telephone Encounter (Signed)
Note faxed. Left message to return call

## 2017-09-28 NOTE — Telephone Encounter (Signed)
Please advise, pt is still waiting for an answer.

## 2017-09-29 NOTE — Telephone Encounter (Signed)
Pt aware note is ready for pick up, ill fax again, yesterday it did not go through.

## 2017-10-06 ENCOUNTER — Other Ambulatory Visit: Payer: Self-pay | Admitting: Obstetrics and Gynecology

## 2017-10-06 ENCOUNTER — Telehealth: Payer: Self-pay

## 2017-10-06 MED ORDER — SERTRALINE HCL 100 MG PO TABS: 100.0000 mg | ORAL_TABLET | Freq: Every day | ORAL | 6 refills | Status: DC

## 2017-10-06 NOTE — Telephone Encounter (Signed)
Pt is about 1 month post partum & is having a little bit of a hard time. Inquiring if she can go up on her Zoloft 50mg  to 100mg ? She is also inquiring if she can get in a pool. Cb#[phone]

## 2017-10-12 ENCOUNTER — Telehealth: Payer: Self-pay

## 2017-10-12 NOTE — Telephone Encounter (Signed)
Left voicemail for patient to call back. Patient is schedule on 10/14/17 with AMS. Will follow up with AMS at schedule appointment. If patient is having severe pain please have Nurse advise patient.

## 2017-10-12 NOTE — Telephone Encounter (Signed)
Schedule with any provider except [facility]. Does not have to be today

## 2017-10-12 NOTE — Telephone Encounter (Signed)
Pt needs a call back she's 5 weeks post C Section, she's having rectal pain on the inside  and soreness on the outside, feels raw,  not sure if its a hemorrhoid or not .

## 2017-10-13 ENCOUNTER — Telehealth: Payer: Self-pay

## 2017-10-13 NOTE — Telephone Encounter (Signed)
FMLA/DISABILITY form for [employer] filled out, signature obtained, and given to [place] for processing.

## 2017-10-14 ENCOUNTER — Encounter: Payer: Self-pay | Admitting: Obstetrics and Gynecology

## 2017-10-14 ENCOUNTER — Telehealth: Payer: Self-pay | Admitting: Obstetrics and Gynecology

## 2017-10-14 ENCOUNTER — Ambulatory Visit (INDEPENDENT_AMBULATORY_CARE_PROVIDER_SITE_OTHER): Payer: BLUE CROSS/BLUE SHIELD | Admitting: Obstetrics and Gynecology

## 2017-10-14 VITALS — BP 122/82 | HR 77 | Ht 63.0 in | Wt 193.0 lb

## 2017-10-14 DIAGNOSIS — Z4889 Encounter for other specified surgical aftercare: Secondary | ICD-10-CM

## 2017-10-14 MED ORDER — NORETHIN-ETH ESTRAD-FE BIPHAS 1 MG-10 MCG / 10 MCG PO TABS: 1.0000 | ORAL_TABLET | Freq: Every day | ORAL | 0 refills | Status: DC

## 2017-10-14 NOTE — Telephone Encounter (Signed)
Pt coming on 7/12 for mirena with ams

## 2017-10-14 NOTE — Progress Notes (Signed)
Postpartum Visit  Chief Complaint:  Chief Complaint  Patient presents with  . Postpartum Care    C/S 5/12  . Contraception    History of Present Illness: Patient is a 37 y.o. G2P1011 presents for postpartum visit.  Date of delivery: 09/06/2017 Cesarean Section: Fetal intolerance to labor Pregnancy or labor problems:  no Any problems since the delivery:  no  Newborn Details:  SINGLETON :  1. BabyGender female. Birth weight:   Maternal Details: 8lbs 1oz Breast or formula feeding: plans to bottle feed Contraception after delivery: No  Any bowel or bladder issues: No  Post partum depression/anxiety noted:  yes Edinburgh Post-Partum Depression Score: 5 Date of last PAP: 01/12/2017  NIL and HR HPV negative   Review of Systems: Review of Systems  Constitutional: Negative.   Gastrointestinal: Negative.   Psychiatric/Behavioral: Negative.  Negative for depression, substance abuse and suicidal ideas.    The following portions of the patient's history were reviewed and updated as appropriate: allergies, current medications, past family history, past medical history, past social history, past surgical history and problem list.  Past Medical History:  Past Medical History:  Diagnosis Date  . Anxiety   . ASCUS of cervix with negative high risk HPV 05/22/2016  . Hypothyroidism     Past Surgical History:  Past Surgical History:  Procedure Laterality Date  . CESAREAN SECTION N/A 09/06/2017   Procedure: CESAREAN SECTION;  Surgeon: Malachy Mood, MD;  Location: [facility] ORS;  Service: Obstetrics;  Laterality: N/A;  . DILATION AND EVACUATION N/A 06/19/2016   Procedure: DILATATION AND EVACUATION;  Surgeon: Gae Dry, MD;  Location: [facility] ORS;  Service: Gynecology;  Laterality: N/A;  . TONSILLECTOMY AND ADENOIDECTOMY    . WISDOM TOOTH EXTRACTION      Family History:  Family History  Problem Relation Age of Onset  . Diabetes Mother   . Hyperlipidemia Maternal Grandmother     . Hypertension Maternal Grandmother   . Breast cancer Maternal Grandmother   . Thyroid disease Maternal Grandmother     Social History:  Social History   Socioeconomic History  . Marital status: Married    Spouse name: Not on file  . Number of children: Not on file  . Years of education: Not on file  . Highest education level: Not on file  Occupational History  . Not on file  Social Needs  . Financial resource strain: Not on file  . Food insecurity:    Worry: Not on file    Inability: Not on file  . Transportation needs:    Medical: Not on file    Non-medical: Not on file  Tobacco Use  . Smoking status: Never Smoker  . Smokeless tobacco: Never Used  Substance and Sexual Activity  . Alcohol use: No  . Drug use: No  . Sexual activity: Yes    Birth control/protection: None  Lifestyle  . Physical activity:    Days per week: Not on file    Minutes per session: Not on file  . Stress: Not on file  Relationships  . Social connections:    Talks on phone: Not on file    Gets together: Not on file    Attends religious service: Not on file    Active member of club or organization: Not on file    Attends meetings of clubs or organizations: Not on file    Relationship status: Not on file  . Intimate partner violence:    Fear of current  or ex partner: Not on file    Emotionally abused: Not on file    Physically abused: Not on file    Forced sexual activity: Not on file  Other Topics Concern  . Not on file  Social History Narrative  . Not on file    Allergies:  Allergies  Allergen Reactions  . Benzonatate Diarrhea  . Metronidazole Other (See Comments)    Body aches, all side effects     Medications: Prior to Admission medications   Medication Sig Start Date End Date Taking? Authorizing Provider  docusate sodium (COLACE) 100 MG capsule Take 1 capsule (100 mg total) by mouth 2 (two) times daily as needed. Patient not taking: Reported on 09/05/2017 06/12/17   Homero Fellers, MD  levothyroxine (SYNTHROID) 75 MCG tablet Take 1 tablet (75 mcg total) by mouth daily before breakfast. 05/26/17   Gae Dry, MD  omeprazole (PRILOSEC) 40 MG capsule Take 20 mg by mouth daily.    [provider]  oxyCODONE-acetaminophen (PERCOCET/ROXICET) 5-325 MG tablet Take 1-2 tablets by mouth every 6 (six) hours as needed. 09/17/17   Malachy Mood, MD  Prenatal Vit-Fe Fumarate-FA (MULTIVITAMIN-PRENATAL) 27-0.8 MG TABS tablet Take 1 tablet by mouth daily at 12 noon.    [provider]  sertraline (ZOLOFT) 100 MG tablet Take 1 tablet (100 mg total) by mouth daily. 10/06/17   Malachy Mood, MD    Physical Exam Blood pressure 122/82, pulse 77, height 5\' 3"  (1.6 m), weight 193 lb (87.5 kg), unknown if currently breastfeeding.  General: NAD HEENT: normocephalic, anicteric Pulmonary: No increased work of breathing Abdomen: NABS, soft, non-tender, non-distended.  Umbilicus without lesions.  No hepatomegaly, splenomegaly or masses palpable. No evidence of hernia. Incision D/C/I Genitourinary:  External: Normal external female genitalia.  Normal urethral meatus, normal  Bartholin's and Skene's glands.    Vagina: Normal vaginal mucosa, no evidence of prolapse.    Cervix: Grossly normal in appearance, no bleeding  Uterus: Non-enlarged, mobile, normal contour.  No CMT  Adnexa: ovaries non-enlarged, no adnexal masses  Rectal: deferred Extremities: no edema, erythema, or tenderness Neurologic: Grossly intact Psychiatric: mood appropriate, affect full    Assessment: 36 y.o. G2P1011 presenting for 6 week postpartum visit  Plan: Problem List Items Addressed This Visit    None      1) Contraception - Education given regarding options for contraception, as well as compatibility with breast feeding if applicable.  Patient plans on IUD for contraception.  Unable to sound past 6cm, will hold of an re-attempt placement in 4 weeks - samples of lo  loestrin FE provided  2)  Pap - ASCCP guidelines and rational discussed.  ASCCP guidelines and rational discussed.  Patient opts for every 3 years screening interval  3) Patient underwent screening for postpartum depression with no signs of depression  4) Return in about 1 month (around 11/11/2017) for medication follow up and mirena.   Malachy Mood, MD, [facility], [facility] 10/14/2017, 2:09 PM

## 2017-10-20 NOTE — Telephone Encounter (Signed)
Noted. Will order to arrive by apt date/time. 

## 2017-10-28 ENCOUNTER — Other Ambulatory Visit: Payer: Self-pay | Admitting: Obstetrics and Gynecology

## 2017-11-06 ENCOUNTER — Encounter: Payer: Self-pay | Admitting: Obstetrics and Gynecology

## 2017-11-06 ENCOUNTER — Ambulatory Visit (INDEPENDENT_AMBULATORY_CARE_PROVIDER_SITE_OTHER): Payer: BLUE CROSS/BLUE SHIELD | Admitting: Obstetrics and Gynecology

## 2017-11-06 VITALS — BP 128/80 | HR 101 | Wt 195.0 lb

## 2017-11-06 DIAGNOSIS — Z30430 Encounter for insertion of intrauterine contraceptive device: Secondary | ICD-10-CM

## 2017-11-06 DIAGNOSIS — F53 Postpartum depression: Secondary | ICD-10-CM

## 2017-11-06 DIAGNOSIS — E039 Hypothyroidism, unspecified: Secondary | ICD-10-CM | POA: Diagnosis not present

## 2017-11-06 DIAGNOSIS — O99345 Other mental disorders complicating the puerperium: Secondary | ICD-10-CM

## 2017-11-06 MED ORDER — SERTRALINE HCL 100 MG PO TABS: 100.0000 mg | ORAL_TABLET | Freq: Every day | ORAL | 11 refills | Status: AC

## 2017-11-06 NOTE — Progress Notes (Signed)
Obstetrics & Gynecology Office Visit   Chief Complaint:  Chief Complaint  Patient presents with  . Medication follow up  . Mirena insert    History of Present Illness: The patient is a 37 y.o. female presenting follow up for symptoms of depression.  The patient is currently taking Zoloft with dose increase to 100mg  from 50mg  at the time of last visit.  She has not had any recent situational stressors.  She reports symptoms of reports resolution of symptoms.  She denies anhedonia, day time somnolence, insomnia, risk taking behavior, irritability, increased appetite, decreased appetite, social anxiety, agorophobia, feelings of guilt, feelings of worthlessness, suicidal ideation, homicidal ideation, auditory hallucinations and visual hallucinations. Symptoms have improved since last visit.     The patient does have a pre-existing history of depression and anxiety.  She  does not a prior history of suicide attempts.  Previous treatment tied include Celexa.  Review of Systems: 10 point review of systems negative unless otherwise noted in HPI  Past Medical History:  Past Medical History:  Diagnosis Date  . Anxiety   . ASCUS of cervix with negative high risk HPV 05/22/2016  . Hypothyroidism     Past Surgical History:  Past Surgical History:  Procedure Laterality Date  . CESAREAN SECTION N/A 09/06/2017   Procedure: CESAREAN SECTION;  Surgeon: Malachy Mood, MD;  Location: [facility] ORS;  Service: Obstetrics;  Laterality: N/A;  . DILATION AND EVACUATION N/A 06/19/2016   Procedure: DILATATION AND EVACUATION;  Surgeon: Gae Dry, MD;  Location: [facility] ORS;  Service: Gynecology;  Laterality: N/A;  . TONSILLECTOMY AND ADENOIDECTOMY    . WISDOM TOOTH EXTRACTION      Gynecologic History: Patient's last menstrual period was 10/17/2017.  Obstetric History: G2P1011  Family History:  Family History  Problem Relation Age of Onset  . Diabetes Mother   . Hyperlipidemia Maternal  Grandmother   . Hypertension Maternal Grandmother   . Breast cancer Maternal Grandmother   . Thyroid disease Maternal Grandmother     Social History:  Social History   Socioeconomic History  . Marital status: Married    Spouse name: Not on file  . Number of children: Not on file  . Years of education: Not on file  . Highest education level: Not on file  Occupational History  . Not on file  Social Needs  . Financial resource strain: Not on file  . Food insecurity:    Worry: Not on file    Inability: Not on file  . Transportation needs:    Medical: Not on file    Non-medical: Not on file  Tobacco Use  . Smoking status: Never Smoker  . Smokeless tobacco: Never Used  Substance and Sexual Activity  . Alcohol use: No  . Drug use: No  . Sexual activity: Yes    Birth control/protection: None  Lifestyle  . Physical activity:    Days per week: Not on file    Minutes per session: Not on file  . Stress: Not on file  Relationships  . Social connections:    Talks on phone: Not on file    Gets together: Not on file    Attends religious service: Not on file    Active member of club or organization: Not on file    Attends meetings of clubs or organizations: Not on file    Relationship status: Not on file  . Intimate partner violence:    Fear of current or ex partner: Not  on file    Emotionally abused: Not on file    Physically abused: Not on file    Forced sexual activity: Not on file  Other Topics Concern  . Not on file  Social History Narrative  . Not on file    Allergies:  Allergies  Allergen Reactions  . Benzonatate Diarrhea  . Metronidazole Other (See Comments)    Body aches, all side effects     Medications: Prior to Admission medications   Medication Sig Start Date End Date Taking? Authorizing Provider  levothyroxine (SYNTHROID) 75 MCG tablet Take 1 tablet (75 mcg total) by mouth daily before breakfast. 05/26/17  Yes Gae Dry, MD    Norethindrone-Ethinyl Estradiol-Fe Biphas (LO LOESTRIN FE) 1 MG-10 MCG / 10 MCG tablet Take 1 tablet by mouth daily. 10/14/17 01/06/18 Yes Malachy Mood, MD  omeprazole (PRILOSEC) 40 MG capsule Take 20 mg by mouth daily.   Yes [provider]  sertraline (ZOLOFT) 100 MG tablet Take 1 tablet (100 mg total) by mouth daily. 11/06/17  Yes Malachy Mood, MD    Physical Exam Vitals:  Vitals:   11/06/17 1413  BP: 128/80  Pulse: (!) 101   Patient's last menstrual period was 10/17/2017.  General: NAD HEENT: normocephalic, anicteric Pulmonary: No increased work of breathing Neurologic: Grossly intact Psychiatric: mood appropriate, affect full  GYNECOLOGY OFFICE PROCEDURE NOTE  Jenny Hendrix is a 37 y.o. G2P1011 here for a Mirena IUD insertion. No GYN concerns.  Patient's last menstrual period was 10/17/2017.Marland Kitchen  The indication for her IUD is contraception/cycle control.  IUD Insertion Procedure Note Patient identified, informed consent performed, consent signed.   Discussed risks of irregular bleeding, cramping, infection, malpositioning, expulsion or uterine perforation of the IUD (1:1000 placements)  which may require further procedure such as laparoscopy.  IUD while effective at preventing pregnancy do not prevent transmission of sexually transmitted diseases and use of barrier methods for this purpose was discussed. Time out was performed.  Urine pregnancy test negative.  Speculum placed in the vagina.  Cervix visualized.  Cleaned with Betadine x 2.  Grasped anteriorly with a single tooth tenaculum.  Uterus sounded to 8.5 cm. IUD placed per manufacturer's recommendations.  Strings trimmed to 3 cm. Tenaculum was removed, good hemostasis noted.  Patient tolerated procedure well.   Patient was given post-procedure instructions.  She was advised to have backup contraception for one week.  Patient was also asked to check IUD strings periodically and follow up in 6 weeks for IUD  check.   No flowsheet data found.  No flowsheet data found.  No flowsheet data found.   Assessment: 37 y.o. G2P1011 Mirena IUD insertion and postpartum depression follow up  Plan: Problem List Items Addressed This Visit    None    Visit Diagnoses    Postpartum depression    -  Primary   Relevant Medications   sertraline (ZOLOFT) 100 MG tablet   Other Relevant Orders   Thyroid Panel With TSH   Encounter for insertion of mirena IUD       Hypothyroidism, unspecified type       Relevant Orders   Thyroid Panel With TSH      1) Doing well on Zoloft 100mg  with resolution of symptoms.  Is happy with response at current dose.  Continue 100mg  Zoloft refill written  2) Thyroid panel checked today  3)  Risks and benefits of IUD discussed including the risks of irregular bleeding, cramping, infection, malpositioning, expulsion or uterine perforation  of the IUD (1:1000 placements)  which may require further procedures such as laparoscopy.  IUDs while effective at preventing pregnancy do not prevent transmission of sexually transmitted diseases and use of barrier methods for this purpose was discussed.  Low overall incidence of failure with 99.7% efficacy rate in typical use.  The patient has not contraindication to IUD placement.    Malachy Mood, MD, [facility], [facility] 11/06/2017, 2:43 PM    zoloft doing well continue at 100mg  Successful Mirena placement at sounded at 8cm

## 2017-11-07 ENCOUNTER — Encounter (INDEPENDENT_AMBULATORY_CARE_PROVIDER_SITE_OTHER): Payer: Self-pay

## 2017-11-07 ENCOUNTER — Other Ambulatory Visit: Payer: Self-pay | Admitting: Obstetrics and Gynecology

## 2017-11-07 LAB — THYROID PANEL WITH TSH
FREE THYROXINE INDEX: 4.6 (ref 1.2–4.9)
T3 Uptake Ratio: 34 % (ref 24–39)
T4, Total: 13.6 ug/dL — ABNORMAL HIGH (ref 4.5–12.0)
TSH: 0.232 u[IU]/mL — AB (ref 0.450–4.500)

## 2017-11-07 MED ORDER — LEVOTHYROXINE SODIUM 50 MCG PO TABS: 50.0000 ug | ORAL_TABLET | Freq: Every day | ORAL | 11 refills | Status: DC

## 2017-11-17 NOTE — Telephone Encounter (Signed)
Mirena rcvd/charged 11/06/17

## 2017-12-21 ENCOUNTER — Encounter: Payer: Self-pay | Admitting: Obstetrics and Gynecology

## 2017-12-21 ENCOUNTER — Ambulatory Visit (INDEPENDENT_AMBULATORY_CARE_PROVIDER_SITE_OTHER): Payer: BLUE CROSS/BLUE SHIELD | Admitting: Obstetrics and Gynecology

## 2017-12-21 VITALS — BP 130/98 | HR 94 | Ht 63.0 in | Wt 204.0 lb

## 2017-12-21 DIAGNOSIS — E039 Hypothyroidism, unspecified: Secondary | ICD-10-CM

## 2017-12-21 DIAGNOSIS — Z30431 Encounter for routine checking of intrauterine contraceptive device: Secondary | ICD-10-CM

## 2017-12-21 NOTE — Progress Notes (Signed)
Obstetrics & Gynecology Office Visit   Chief Complaint:  Chief Complaint  Patient presents with  . Mirena string check    History of Present Illness: 37 y.o. patient presenting for follow up of Mirena IUD placement 6+ weeks ago.  The indication for her IUD was contraception.  She denies any complications since her IUD placement.  Still having some occasional spotting.  is able to feel strings.    Review of Systems: Review of Systems  Constitutional: Negative.   Gastrointestinal: Negative for abdominal pain and constipation.  Skin: Negative.   Psychiatric/Behavioral: Negative.     Past Medical History:  Past Medical History:  Diagnosis Date  . Anxiety   . ASCUS of cervix with negative high risk HPV 05/22/2016  . Hypothyroidism     Past Surgical History:  Past Surgical History:  Procedure Laterality Date  . CESAREAN SECTION N/A 09/06/2017   Procedure: CESAREAN SECTION;  Surgeon: Malachy Mood, MD;  Location: [facility] ORS;  Service: Obstetrics;  Laterality: N/A;  . DILATION AND EVACUATION N/A 06/19/2016   Procedure: DILATATION AND EVACUATION;  Surgeon: Gae Dry, MD;  Location: [facility] ORS;  Service: Gynecology;  Laterality: N/A;  . TONSILLECTOMY AND ADENOIDECTOMY    . WISDOM TOOTH EXTRACTION      Gynecologic History: Patient's last menstrual period was 11/14/2017 (exact date).  Obstetric History: G2P1011  Family History:  Family History  Problem Relation Age of Onset  . Diabetes Mother   . Hyperlipidemia Maternal Grandmother   . Hypertension Maternal Grandmother   . Breast cancer Maternal Grandmother   . Thyroid disease Maternal Grandmother     Social History:  Social History   Socioeconomic History  . Marital status: Married    Spouse name: Not on file  . Number of children: Not on file  . Years of education: Not on file  . Highest education level: Not on file  Occupational History  . Not on file  Social Needs  . Financial resource strain: Not on  file  . Food insecurity:    Worry: Not on file    Inability: Not on file  . Transportation needs:    Medical: Not on file    Non-medical: Not on file  Tobacco Use  . Smoking status: Never Smoker  . Smokeless tobacco: Never Used  Substance and Sexual Activity  . Alcohol use: No  . Drug use: No  . Sexual activity: Yes    Birth control/protection: None  Lifestyle  . Physical activity:    Days per week: Not on file    Minutes per session: Not on file  . Stress: Not on file  Relationships  . Social connections:    Talks on phone: Not on file    Gets together: Not on file    Attends religious service: Not on file    Active member of club or organization: Not on file    Attends meetings of clubs or organizations: Not on file    Relationship status: Not on file  . Intimate partner violence:    Fear of current or ex partner: Not on file    Emotionally abused: Not on file    Physically abused: Not on file    Forced sexual activity: Not on file  Other Topics Concern  . Not on file  Social History Narrative  . Not on file    Allergies:  Allergies  Allergen Reactions  . Benzonatate Diarrhea  . Metronidazole Other (See Comments)  Body aches, all side effects     Medications: Prior to Admission medications   Medication Sig Start Date End Date Taking? Authorizing Provider  levonorgestrel (MIRENA) 20 MCG/24HR IUD 1 each by Intrauterine route once.   Yes [provider]  levothyroxine (SYNTHROID, LEVOTHROID) 50 MCG tablet Take 1 tablet (50 mcg total) by mouth daily before breakfast. 11/07/17  Yes Malachy Mood, MD  omeprazole (PRILOSEC) 40 MG capsule Take 20 mg by mouth daily.   Yes [provider]  sertraline (ZOLOFT) 100 MG tablet Take 1 tablet (100 mg total) by mouth daily. 11/06/17  Yes Malachy Mood, MD    Physical Exam Blood pressure (!) 130/98, pulse 94, height 5\' 3"  (1.6 m), weight 204 lb (92.5 kg), last menstrual period 11/14/2017, not  currently breastfeeding. Patient's last menstrual period was 11/14/2017 (exact date).  General: NAD HEENT: normocephalic, anicteric Pulmonary: No increased work of breathing Genitourinary:  External: Normal external female genitalia.  Normal urethral meatus, normal  Bartholin's and Skene's glands.    Vagina: Normal vaginal mucosa, no evidence of prolapse.    Cervix: Grossly normal in appearance, no bleeding, IUD strings visualized 2cm  Uterus: Non-enlarged, mobile, normal contour.  No CMT  Adnexa: ovaries non-enlarged, no adnexal masses  Rectal: deferred  Lymphatic: no evidence of inguinal lymphadenopathy Extremities: no edema, erythema, or tenderness Neurologic: Grossly intact Psychiatric: mood appropriate, affect full  Female chaperone present for pelvic and breast  portions of the physical exam  Assessment: 37 y.o. G2P1011 IUD string check and thyroid check  Plan: Problem List Items Addressed This Visit    None    Visit Diagnoses    Hypothyroidism, unspecified type    -  Primary   Relevant Orders   Thyroid Profile   IUD check up           1.  The patient was given instructions to check her IUD strings monthly and call with any problems or concerns.  She should call for fevers, chills, abnormal vaginal discharge, pelvic pain, or other complaints.  2.   IUDs while effective at preventing pregnancy do not prevent transmission of sexually transmitted diseases and use of barrier methods for this purpose was discussed.  Low overall incidence of failure with 99.7% efficacy rate in typical use.  The patient has not contraindication to IUD placement.  3.  She will return for a annual exam in 1 year.  All questions answered.  4) Postpartum depression - still report good symptom control on 100mg  of Zoloft  5) Hypothyroidism - over corrected on last check in dose reduction to 60mcg of levothyroxine in July.  Repeat thyroid panel today  6) A total of 15 minutes were spent in  face-to-face contact with the patient during this encounter with over half of that time devoted to counseling and coordination of care.  7) Return in about 6 months (around 06/23/2018) for annual.   Malachy Mood, MD, [place] [place] 12/21/2017, 5:00 PM

## 2017-12-22 LAB — THYROID PANEL
Free Thyroxine Index: 1.9 (ref 1.2–4.9)
T3 Uptake Ratio: 30 % (ref 24–39)
T4, Total: 6.2 ug/dL (ref 4.5–12.0)

## 2018-03-01 ENCOUNTER — Encounter: Payer: Self-pay | Admitting: Obstetrics and Gynecology

## 2018-03-01 ENCOUNTER — Telehealth: Payer: Self-pay

## 2018-03-01 ENCOUNTER — Ambulatory Visit (INDEPENDENT_AMBULATORY_CARE_PROVIDER_SITE_OTHER): Payer: BLUE CROSS/BLUE SHIELD | Admitting: Obstetrics and Gynecology

## 2018-03-01 VITALS — BP 120/90 | HR 90 | Ht 63.0 in | Wt 215.0 lb

## 2018-03-01 DIAGNOSIS — N946 Dysmenorrhea, unspecified: Secondary | ICD-10-CM

## 2018-03-01 DIAGNOSIS — O99345 Other mental disorders complicating the puerperium: Secondary | ICD-10-CM

## 2018-03-01 DIAGNOSIS — Z30430 Encounter for insertion of intrauterine contraceptive device: Secondary | ICD-10-CM

## 2018-03-01 DIAGNOSIS — F53 Postpartum depression: Secondary | ICD-10-CM

## 2018-03-01 MED ORDER — LEVONORGESTREL 20 MCG/24HR IU IUD: 1.0000 | INTRAUTERINE_SYSTEM | Freq: Once | INTRAUTERINE | 0 refills | Status: AC

## 2018-03-01 MED ORDER — HYDROCODONE-ACETAMINOPHEN 5-325 MG PO TABS: 1.0000 | ORAL_TABLET | Freq: Four times a day (QID) | ORAL | 0 refills | Status: DC | PRN

## 2018-03-01 NOTE — Patient Instructions (Addendum)
I value your feedback and entrusting us with your care. If you get a [facility] patient survey, I would appreciate you taking the time to let us know about your experience today. Thank you!  [facility] [phone]  Instructions after IUD insertion  Most women experience no significant problems after insertion of an IUD, however minor cramping and spotting for a few days is common. Cramps may be treated with ibuprofen 800mg every 8 hours or Tylenol 650 mg every 4 hours. Contact [facility] immediately if you experience any of the following symptoms during the next week: temperature >99.6 degrees, worsening pelvic pain, abdominal pain, fainting, unusually heavy vaginal bleeding, foul vaginal discharge, or if you think you have expelled the IUD.  Nothing inserted in the vagina for 48 hours. You will be scheduled for a follow up visit in approximately four weeks.  You should check monthly to be sure you can feel the IUD strings in the upper vagina. If you are having a monthly period, try to check after each period. If you cannot feel the IUD strings,  contact [facility] immediately so we can do an exam to determine if the IUD has been expelled.   Please use backup protection until we can confirm the IUD is in place.  Call [facility] if you are exposed to or diagnosed with a sexually transmitted infection, as we will need to discuss whether it is safe for you to continue using an IUD.   

## 2018-03-01 NOTE — Telephone Encounter (Signed)
Pt's IUD has come out.  Is off work today and would like appt today to have IUD replaced.  She called after hour nurse twice.  [phone]

## 2018-03-01 NOTE — Telephone Encounter (Signed)
Patient is schedule 3:00 with [facility] on 03/01/18 for Mirena insertion

## 2018-03-01 NOTE — Progress Notes (Signed)
   Chief Complaint  Patient presents with  . Contraception    insertion of Mirena IUD, would like to lower zoloft, concerned about pain during cycles     IUD PROCEDURE NOTE:  Jenny Hendrix is a 37 y.o. G2P1011 here for Mirena  IUD insertion for [facility] and dysmen. Initial Mirena placed 11/06/17. Spontaneously expelled with IUD string check 02/28/18. No sex activity since. Having period now. Flow is light but cramps are still bad. Used to take vicodin sparingly for pain. Has Rx ibup 800 mg without relief. Was late with menses last month and had neg home UPT last wk. Likes IUD overall. Would like another one.  Pt also taking zoloft 100 mg daily for post partum depression. Feels better, has good routine. Would like to wean down.   BP 120/90   Pulse 90   Ht 5\' 3"  (1.6 m)   Wt 215 lb (97.5 kg)   LMP 02/26/2018 (Exact Date)   Breastfeeding? No   BMI 38.09 kg/m   IUD Insertion Procedure Note Patient identified, informed consent performed, consent signed.   Discussed risks of irregular bleeding, cramping, infection, malpositioning or misplacement of the IUD outside the uterus which may require further procedure such as laparoscopy, risk of failure <1%. Time out was performed.    Speculum placed in the vagina.  Cervix visualized.  Cleaned with Betadine x 2.  Grasped anteriorly with a single tooth tenaculum.  Uterus sounded to 9.0 cm.   IUD placed per manufacturer's recommendations.  Strings trimmed to 3 cm. Tenaculum was removed, good hemostasis noted.  Patient tolerated procedure well.   ASSESSMENT:  Encounter for insertion of intrauterine contraceptive device (IUD) - Plan: levonorgestrel (MIRENA) 20 MCG/24HR IUD  Dysmenorrhea - Rx vicodin. Use sparingly. F/u prn.  - Plan: HYDROcodone-acetaminophen (NORCO/VICODIN) 5-325 MG tablet  Postpartum depression - Sx improved with zoloft 100 mg dose. Can cut in 1/2 for 50 mg dose daily. F/u prn Rx RF.   Meds ordered this encounter  Medications  .  HYDROcodone-acetaminophen (NORCO/VICODIN) 5-325 MG tablet    Sig: Take 1 tablet by mouth every 6 (six) hours as needed.    Dispense:  20 tablet    Refill:  0    Order Specific Question:   Supervising Provider    Answer:   Gae Dry [id number]  . levonorgestrel (MIRENA) 20 MCG/24HR IUD    Sig: 1 Intra Uterine Device (1 each total) by Intrauterine route once for 1 dose.    Dispense:  1 each    Refill:  0    Order Specific Question:   Supervising Provider    Answer:   Gae Dry [[id number]]     Plan:  Patient was given post-procedure instructions.  She was advised to have backup contraception for one week.   Call if you are having increasing pain, cramps or bleeding or if you have a fever greater than 100.4 degrees F., shaking chills, nausea or vomiting. Patient was also asked to check IUD strings periodically and follow up in 4 weeks for IUD check.  Return in about 4 weeks (around 03/29/2018) for IUD f/u.  Zyier Dykema B. Krithik Mapel, PA-C 03/01/2018 4:21 PM

## 2018-03-01 NOTE — Telephone Encounter (Signed)
Mirena reserved for this patient.

## 2018-03-08 ENCOUNTER — Telehealth: Payer: Self-pay

## 2018-03-08 NOTE — Telephone Encounter (Signed)
Pt called triage saying she could not feel the strings over the weekend, but can feel IUD in "cervix/uterus or however you wanna call it" per pts words. Does she need to be concerned and be seen?

## 2018-03-09 ENCOUNTER — Ambulatory Visit (INDEPENDENT_AMBULATORY_CARE_PROVIDER_SITE_OTHER): Payer: BLUE CROSS/BLUE SHIELD | Admitting: Obstetrics and Gynecology

## 2018-03-09 ENCOUNTER — Encounter: Payer: Self-pay | Admitting: Obstetrics and Gynecology

## 2018-03-09 ENCOUNTER — Ambulatory Visit (INDEPENDENT_AMBULATORY_CARE_PROVIDER_SITE_OTHER): Payer: BLUE CROSS/BLUE SHIELD

## 2018-03-09 VITALS — BP 134/84 | HR 97 | Ht 63.0 in | Wt 216.0 lb

## 2018-03-09 DIAGNOSIS — N83202 Unspecified ovarian cyst, left side: Secondary | ICD-10-CM

## 2018-03-09 DIAGNOSIS — T8332XA Displacement of intrauterine contraceptive device, initial encounter: Secondary | ICD-10-CM

## 2018-03-09 NOTE — Telephone Encounter (Signed)
Can sched appt for string check.

## 2018-03-09 NOTE — Patient Instructions (Signed)
I value your feedback and entrusting us with your care. If you get a  patient survey, I would appreciate you taking the time to let us know about your experience today. Thank you! 

## 2018-03-09 NOTE — Telephone Encounter (Signed)
Pt aware, transferred her to schedule an appt.

## 2018-03-09 NOTE — Telephone Encounter (Signed)
She can if she wants, but if she can come in today, we can check IUD first.

## 2018-03-09 NOTE — Progress Notes (Signed)
   Chief Complaint  Patient presents with  . Contraception    IUD check     History of Present Illness:  Jenny ABBETT is a 37 y.o. that had a Mirena IUD placed approximately 1 weeks ago. Since that time, she denies dyspareunia, pelvic pain, non-menstrual bleeding, vaginal d/c, heavy bleeding. She was sex active 2 days ago and can't feel the IUD strings. She is s/p a spontaneous IUD expulsion a few wks ago, hence the replacement a wk ago. She states she can feel the IUD tip with her finger.  Review of Systems  Constitutional: Negative for fever.  Gastrointestinal: Negative for blood in stool, constipation, diarrhea, nausea and vomiting.  Genitourinary: Negative for dyspareunia, dysuria, flank pain, frequency, hematuria, urgency, vaginal bleeding, vaginal discharge and vaginal pain.  Musculoskeletal: Negative for back pain.  Skin: Negative for rash.    Physical Exam:  BP 134/84   Pulse 97   Ht 5\' 3"  (1.6 m)   Wt 216 lb (98 kg)   LMP 02/26/2018 (Exact Date)   Breastfeeding? No   BMI 38.26 kg/m  Body mass index is 38.26 kg/m.  Pelvic exam:  Two IUD strings absent from the cervical os. Nabothian cyst palpated on cervix (what pt thought was tip of IUD). No IUD palpated. EGBUS, vaginal vault and cervix: within normal limits  RESULTS:  Patient Name: Jenny Hendrix DOB: 03-Apr-1981 MRN: [id number] ULTRASOUND REPORT  Location: [facility]  Date of Service: 03/09/2018    Indications: IUD check Findings:  The uterus is retroverted and measures 7.2 x 5.3 x 4.3cm. Echo texture is homogenous without evidence of focal masses.  The Endometrium measures 4.6 mm. IUD in place  Right Ovary measures 2.7 x 2.3 x 2.1 cm. It is normal in appearance. Left Ovary measures 3.8 x 3.4 x 3.2 cm with simple cyst measuring 2.7cm. Survey of the adnexa demonstrates no adnexal masses. There is no free fluid in the cul de sac.  Impression: 1. IUD in place 2. Simple left ovarian  cyst  Recommendations: 1.Clinical correlation with the patient's History and Physical Exam.  Vita Barley, RDMS RVT  Assessment:   Intrauterine contraceptive device threads lost, initial encounter - IUD in place on u/s. Reassurance. F/u prn.  - Plan: US PELVIS TRANSVANGINAL NON-OB (TV ONLY)  Cyst of left ovary - On u/s today. Pt with hx of ovar cysts. Mild sx currently. Recommend repeat u/s in 8 wks. NSAIDs prn.   Plan: F/u prn/ 3 wk IUD f/u appt  Alicia B. Copland, PA-C 03/10/2018 8:34 AM

## 2018-03-09 NOTE — Telephone Encounter (Signed)
Called pt and she said she would call back at her earliest convenience to schedule it. She wants to know if she should do plan B because she had intercourse 2 nights ago. She says when she goes look for IUD strings, she cant find them but she can feel the actual IUD.

## 2018-03-10 ENCOUNTER — Encounter: Payer: Self-pay | Admitting: Obstetrics and Gynecology

## 2018-03-10 DIAGNOSIS — N83202 Unspecified ovarian cyst, left side: Secondary | ICD-10-CM | POA: Insufficient documentation

## 2018-04-27 ENCOUNTER — Emergency Department: Payer: 59

## 2018-04-27 ENCOUNTER — Other Ambulatory Visit: Payer: Self-pay

## 2018-04-27 ENCOUNTER — Inpatient Hospital Stay
Admission: EM | Admit: 2018-04-27 | Discharge: 2018-05-01 | DRG: 871 | Disposition: A | Discharge status: Home / Self Care | Payer: 59 | Attending: Internal Medicine | Admitting: Internal Medicine

## 2018-04-27 DIAGNOSIS — R05 Cough: Secondary | ICD-10-CM | POA: Diagnosis not present

## 2018-04-27 DIAGNOSIS — Z8349 Family history of other endocrine, nutritional and metabolic diseases: Secondary | ICD-10-CM

## 2018-04-27 DIAGNOSIS — Z79890 Hormone replacement therapy (postmenopausal): Secondary | ICD-10-CM

## 2018-04-27 DIAGNOSIS — E669 Obesity, unspecified: Secondary | ICD-10-CM | POA: Diagnosis present

## 2018-04-27 DIAGNOSIS — E039 Hypothyroidism, unspecified: Secondary | ICD-10-CM | POA: Diagnosis present

## 2018-04-27 DIAGNOSIS — Z6838 Body mass index (BMI) 38.0-38.9, adult: Secondary | ICD-10-CM

## 2018-04-27 DIAGNOSIS — J9601 Acute respiratory failure with hypoxia: Secondary | ICD-10-CM | POA: Diagnosis present

## 2018-04-27 DIAGNOSIS — I77810 Thoracic aortic ectasia: Secondary | ICD-10-CM | POA: Diagnosis present

## 2018-04-27 DIAGNOSIS — Z9089 Acquired absence of other organs: Secondary | ICD-10-CM

## 2018-04-27 DIAGNOSIS — J189 Pneumonia, unspecified organism: Secondary | ICD-10-CM | POA: Diagnosis present

## 2018-04-27 DIAGNOSIS — Z79899 Other long term (current) drug therapy: Secondary | ICD-10-CM

## 2018-04-27 DIAGNOSIS — Z8249 Family history of ischemic heart disease and other diseases of the circulatory system: Secondary | ICD-10-CM

## 2018-04-27 DIAGNOSIS — A419 Sepsis, unspecified organism: Secondary | ICD-10-CM | POA: Diagnosis not present

## 2018-04-27 DIAGNOSIS — Z803 Family history of malignant neoplasm of breast: Secondary | ICD-10-CM

## 2018-04-27 DIAGNOSIS — Z888 Allergy status to other drugs, medicaments and biological substances status: Secondary | ICD-10-CM

## 2018-04-27 DIAGNOSIS — F419 Anxiety disorder, unspecified: Secondary | ICD-10-CM | POA: Diagnosis present

## 2018-04-27 DIAGNOSIS — K219 Gastro-esophageal reflux disease without esophagitis: Secondary | ICD-10-CM | POA: Diagnosis present

## 2018-04-27 DIAGNOSIS — Z793 Long term (current) use of hormonal contraceptives: Secondary | ICD-10-CM

## 2018-04-27 DIAGNOSIS — Z833 Family history of diabetes mellitus: Secondary | ICD-10-CM

## 2018-04-27 DIAGNOSIS — F329 Major depressive disorder, single episode, unspecified: Secondary | ICD-10-CM | POA: Diagnosis present

## 2018-04-27 HISTORY — DX: Major depressive disorder, single episode, unspecified: F32.9

## 2018-04-27 HISTORY — DX: Depression, unspecified: F32.A

## 2018-04-27 HISTORY — DX: Herpesviral infection, unspecified: B00.9

## 2018-04-27 HISTORY — DX: Hypothyroidism, unspecified: E03.9

## 2018-04-27 LAB — COMPREHENSIVE METABOLIC PANEL
ALT: 16 U/L (ref 0–44)
AST: 18 U/L (ref 15–41)
Albumin: 3.8 g/dL (ref 3.5–5.0)
Alkaline Phosphatase: 81 U/L (ref 38–126)
Anion gap: 8 (ref 5–15)
BUN: 11 mg/dL (ref 6–20)
CHLORIDE: 105 mmol/L (ref 98–111)
CO2: 24 mmol/L (ref 22–32)
Calcium: 8.5 mg/dL — ABNORMAL LOW (ref 8.9–10.3)
Creatinine, Ser: 0.62 mg/dL (ref 0.44–1.00)
Glucose, Bld: 110 mg/dL — ABNORMAL HIGH (ref 70–99)
POTASSIUM: 3.1 mmol/L — AB (ref 3.5–5.1)
Sodium: 137 mmol/L (ref 135–145)
Total Bilirubin: 0.5 mg/dL (ref 0.3–1.2)
Total Protein: 7.9 g/dL (ref 6.5–8.1)

## 2018-04-27 LAB — CBC
HCT: 35.2 % — ABNORMAL LOW (ref 36.0–46.0)
Hemoglobin: 11.2 g/dL — ABNORMAL LOW (ref 12.0–15.0)
MCH: 29.5 pg (ref 26.0–34.0)
MCHC: 31.8 g/dL (ref 30.0–36.0)
MCV: 92.6 fL (ref 80.0–100.0)
NRBC: 0 % (ref 0.0–0.2)
PLATELETS: 395 10*3/uL (ref 150–400)
RBC: 3.8 MIL/uL — ABNORMAL LOW (ref 3.87–5.11)
RDW: 13.6 % (ref 11.5–15.5)
WBC: 14.9 10*3/uL — ABNORMAL HIGH (ref 4.0–10.5)

## 2018-04-27 MED ORDER — IPRATROPIUM-ALBUTEROL 0.5-2.5 (3) MG/3ML IN SOLN
3.0000 mL | Freq: Once | RESPIRATORY_TRACT | Status: AC
  Administered 2018-04-27: 3 mL via RESPIRATORY_TRACT
  Filled 2018-04-27: qty 3

## 2018-04-27 MED ORDER — SODIUM CHLORIDE 0.9 % IV SOLN
1.0000 g | Freq: Once | INTRAVENOUS | Status: AC
  Administered 2018-04-27: 1 g via INTRAVENOUS
  Filled 2018-04-27: qty 10

## 2018-04-27 MED ORDER — SODIUM CHLORIDE 0.9 % IV SOLN
500.0000 mg | Freq: Once | INTRAVENOUS | Status: AC
  Administered 2018-04-27: 500 mg via INTRAVENOUS
  Filled 2018-04-27: qty 500

## 2018-04-27 NOTE — Progress Notes (Signed)
CODE SEPSIS - PHARMACY COMMUNICATION  **Broad Spectrum Antibiotics should be administered within 1 hour of Sepsis diagnosis**  Time Code Sepsis Called/Page Received: 2331  Antibiotics Ordered: azithro/ceftriaxone  Time of 1st antibiotic administration: 2346  Additional action taken by pharmacy:   If necessary, Name of Provider/Nurse Contacted:     Tobie Lords ,PharmD Clinical Pharmacist  04/27/2018  11:46 PM

## 2018-04-27 NOTE — ED Triage Notes (Signed)
Pt to the er for fever, sob, cough, purple lips, works in [employer] Flu negative. Sats were 83% on room air. Did a neb at work. Pt is SOB at this time.

## 2018-04-28 DIAGNOSIS — J9601 Acute respiratory failure with hypoxia: Secondary | ICD-10-CM | POA: Diagnosis present

## 2018-04-28 DIAGNOSIS — K219 Gastro-esophageal reflux disease without esophagitis: Secondary | ICD-10-CM | POA: Diagnosis present

## 2018-04-28 DIAGNOSIS — I77810 Thoracic aortic ectasia: Secondary | ICD-10-CM | POA: Diagnosis present

## 2018-04-28 DIAGNOSIS — Z888 Allergy status to other drugs, medicaments and biological substances status: Secondary | ICD-10-CM | POA: Diagnosis not present

## 2018-04-28 DIAGNOSIS — Z8349 Family history of other endocrine, nutritional and metabolic diseases: Secondary | ICD-10-CM | POA: Diagnosis not present

## 2018-04-28 DIAGNOSIS — Z803 Family history of malignant neoplasm of breast: Secondary | ICD-10-CM | POA: Diagnosis not present

## 2018-04-28 DIAGNOSIS — J189 Pneumonia, unspecified organism: Secondary | ICD-10-CM | POA: Diagnosis present

## 2018-04-28 DIAGNOSIS — A419 Sepsis, unspecified organism: Secondary | ICD-10-CM | POA: Diagnosis present

## 2018-04-28 DIAGNOSIS — Z79899 Other long term (current) drug therapy: Secondary | ICD-10-CM | POA: Diagnosis not present

## 2018-04-28 DIAGNOSIS — Z8249 Family history of ischemic heart disease and other diseases of the circulatory system: Secondary | ICD-10-CM | POA: Diagnosis not present

## 2018-04-28 DIAGNOSIS — E039 Hypothyroidism, unspecified: Secondary | ICD-10-CM | POA: Diagnosis present

## 2018-04-28 DIAGNOSIS — Z6838 Body mass index (BMI) 38.0-38.9, adult: Secondary | ICD-10-CM | POA: Diagnosis not present

## 2018-04-28 DIAGNOSIS — F419 Anxiety disorder, unspecified: Secondary | ICD-10-CM | POA: Diagnosis present

## 2018-04-28 DIAGNOSIS — F329 Major depressive disorder, single episode, unspecified: Secondary | ICD-10-CM | POA: Diagnosis present

## 2018-04-28 DIAGNOSIS — R05 Cough: Secondary | ICD-10-CM | POA: Diagnosis present

## 2018-04-28 DIAGNOSIS — Z9089 Acquired absence of other organs: Secondary | ICD-10-CM | POA: Diagnosis not present

## 2018-04-28 DIAGNOSIS — Z79890 Hormone replacement therapy (postmenopausal): Secondary | ICD-10-CM | POA: Diagnosis not present

## 2018-04-28 DIAGNOSIS — Z793 Long term (current) use of hormonal contraceptives: Secondary | ICD-10-CM | POA: Diagnosis not present

## 2018-04-28 DIAGNOSIS — R0902 Hypoxemia: Secondary | ICD-10-CM

## 2018-04-28 DIAGNOSIS — Z833 Family history of diabetes mellitus: Secondary | ICD-10-CM | POA: Diagnosis not present

## 2018-04-28 DIAGNOSIS — E669 Obesity, unspecified: Secondary | ICD-10-CM | POA: Diagnosis present

## 2018-04-28 HISTORY — DX: Hypoxemia: R09.02

## 2018-04-28 LAB — CBC
HCT: 33.9 % — ABNORMAL LOW (ref 36.0–46.0)
HEMOGLOBIN: 10.6 g/dL — AB (ref 12.0–15.0)
MCH: 29.3 pg (ref 26.0–34.0)
MCHC: 31.3 g/dL (ref 30.0–36.0)
MCV: 93.6 fL (ref 80.0–100.0)
PLATELETS: 331 10*3/uL (ref 150–400)
RBC: 3.62 MIL/uL — AB (ref 3.87–5.11)
RDW: 13.6 % (ref 11.5–15.5)
WBC: 12.2 10*3/uL — ABNORMAL HIGH (ref 4.0–10.5)
nRBC: 0 % (ref 0.0–0.2)

## 2018-04-28 LAB — URINALYSIS, COMPLETE (UACMP) WITH MICROSCOPIC
Bacteria, UA: NONE SEEN
Bilirubin Urine: NEGATIVE
Glucose, UA: NEGATIVE mg/dL
Ketones, ur: NEGATIVE mg/dL
Leukocytes, UA: NEGATIVE
Nitrite: NEGATIVE
Protein, ur: 100 mg/dL — AB
Specific Gravity, Urine: 1.031 — ABNORMAL HIGH (ref 1.005–1.030)
pH: 6 (ref 5.0–8.0)

## 2018-04-28 LAB — LACTIC ACID, PLASMA: Lactic Acid, Venous: 1.7 mmol/L (ref 0.5–1.9)

## 2018-04-28 LAB — HIV ANTIBODY (ROUTINE TESTING W REFLEX): HIV Screen 4th Generation wRfx: NONREACTIVE

## 2018-04-28 LAB — INFLUENZA PANEL BY PCR (TYPE A & B)
Influenza A By PCR: NEGATIVE
Influenza B By PCR: NEGATIVE

## 2018-04-28 LAB — BASIC METABOLIC PANEL
ANION GAP: 8 (ref 5–15)
BUN: 8 mg/dL (ref 6–20)
CALCIUM: 7.9 mg/dL — AB (ref 8.9–10.3)
CO2: 24 mmol/L (ref 22–32)
Chloride: 105 mmol/L (ref 98–111)
Creatinine, Ser: 0.56 mg/dL (ref 0.44–1.00)
GFR calc Af Amer: >60 mL/min (ref >60)
GFR calc non Af Amer: >60 mL/min (ref >60)
Glucose, Bld: 97 mg/dL (ref 70–99)
Potassium: 3.2 mmol/L — ABNORMAL LOW (ref 3.5–5.1)
Sodium: 137 mmol/L (ref 135–145)

## 2018-04-28 LAB — MRSA PCR SCREENING: MRSA BY PCR: NEGATIVE

## 2018-04-28 MED ORDER — ONDANSETRON HCL 4 MG/2ML IJ SOLN
4.0000 mg | Freq: Four times a day (QID) | INTRAMUSCULAR | Status: DC | PRN
  Administered 2018-04-28 – 2018-04-30 (×3): 4 mg via INTRAVENOUS
  Filled 2018-04-28 (×3): qty 2

## 2018-04-28 MED ORDER — IBUPROFEN 400 MG PO TABS
400.0000 mg | ORAL_TABLET | Freq: Four times a day (QID) | ORAL | Status: DC | PRN
  Administered 2018-04-28 – 2018-04-30 (×6): 400 mg via ORAL
  Filled 2018-04-28 (×6): qty 1

## 2018-04-28 MED ORDER — ONDANSETRON HCL 4 MG/2ML IJ SOLN
4.0000 mg | Freq: Once | INTRAMUSCULAR | Status: AC
  Administered 2018-04-28: 4 mg via INTRAVENOUS

## 2018-04-28 MED ORDER — SODIUM CHLORIDE 0.9 % IV SOLN
1.0000 g | INTRAVENOUS | Status: DC
  Administered 2018-04-28 – 2018-05-01 (×3): 1 g via INTRAVENOUS
  Filled 2018-04-28 (×3): qty 1

## 2018-04-28 MED ORDER — ENOXAPARIN SODIUM 40 MG/0.4ML ~~LOC~~ SOLN
40.0000 mg | SUBCUTANEOUS | Status: DC
  Administered 2018-04-28 – 2018-05-01 (×4): 40 mg via SUBCUTANEOUS
  Filled 2018-04-28 (×4): qty 0.4

## 2018-04-28 MED ORDER — GUAIFENESIN-DM 100-10 MG/5ML PO SYRP
5.0000 mL | ORAL_SOLUTION | ORAL | Status: DC | PRN
  Administered 2018-04-28 – 2018-05-01 (×2): 5 mL via ORAL
  Filled 2018-04-28 (×6): qty 5

## 2018-04-28 MED ORDER — SODIUM CHLORIDE 0.9% FLUSH
3.0000 mL | INTRAVENOUS | Status: DC | PRN
  Administered 2018-04-28 (×2): 3 mL via INTRAVENOUS

## 2018-04-28 MED ORDER — ONDANSETRON HCL 4 MG PO TABS
4.0000 mg | ORAL_TABLET | Freq: Four times a day (QID) | ORAL | Status: DC | PRN
  Administered 2018-04-28 – 2018-04-30 (×2): 4 mg via ORAL
  Filled 2018-04-28 (×2): qty 1

## 2018-04-28 MED ORDER — SODIUM CHLORIDE 0.9 % IV SOLN
500.0000 mg | INTRAVENOUS | Status: DC
  Administered 2018-04-28 – 2018-04-30 (×3): 500 mg via INTRAVENOUS
  Filled 2018-04-28 (×3): qty 500

## 2018-04-28 MED ORDER — IPRATROPIUM-ALBUTEROL 0.5-2.5 (3) MG/3ML IN SOLN
3.0000 mL | RESPIRATORY_TRACT | Status: DC | PRN
  Administered 2018-04-28 – 2018-04-30 (×3): 3 mL via RESPIRATORY_TRACT
  Filled 2018-04-28: qty 3

## 2018-04-28 MED ORDER — POTASSIUM CHLORIDE CRYS ER 20 MEQ PO TBCR
40.0000 meq | EXTENDED_RELEASE_TABLET | Freq: Once | ORAL | Status: AC
  Administered 2018-04-28: 40 meq via ORAL
  Filled 2018-04-28: qty 4

## 2018-04-28 MED ORDER — ONDANSETRON HCL 4 MG/2ML IJ SOLN
INTRAMUSCULAR | Status: AC
  Filled 2018-04-28: qty 2

## 2018-04-28 MED ORDER — PANTOPRAZOLE SODIUM 40 MG PO TBEC
40.0000 mg | DELAYED_RELEASE_TABLET | Freq: Every day | ORAL | Status: DC
  Administered 2018-04-28 – 2018-04-30 (×3): 40 mg via ORAL
  Filled 2018-04-28 (×3): qty 1

## 2018-04-28 MED ORDER — LEVOTHYROXINE SODIUM 50 MCG PO TABS
50.0000 ug | ORAL_TABLET | Freq: Every day | ORAL | Status: DC
  Administered 2018-04-28 – 2018-05-01 (×4): 50 ug via ORAL
  Filled 2018-04-28 (×4): qty 1

## 2018-04-28 MED ORDER — CLONAZEPAM 0.5 MG PO TABS
1.0000 mg | ORAL_TABLET | Freq: Three times a day (TID) | ORAL | Status: DC | PRN
  Administered 2018-04-28 (×2): 1 mg via ORAL
  Filled 2018-04-28 (×2): qty 2

## 2018-04-28 MED ORDER — ACETAMINOPHEN 325 MG PO TABS
650.0000 mg | ORAL_TABLET | Freq: Four times a day (QID) | ORAL | Status: DC | PRN
  Administered 2018-04-28 – 2018-05-01 (×4): 650 mg via ORAL
  Filled 2018-04-28 (×4): qty 2

## 2018-04-28 MED ORDER — ORAL CARE MOUTH RINSE
15.0000 mL | Freq: Two times a day (BID) | OROMUCOSAL | Status: DC
  Administered 2018-04-28: 08:00:00 15 mL via OROMUCOSAL

## 2018-04-28 MED ORDER — ACETAMINOPHEN 650 MG RE SUPP: 650.0000 mg | Freq: Four times a day (QID) | RECTAL | Status: DC | PRN

## 2018-04-28 MED ORDER — SERTRALINE HCL 50 MG PO TABS
50.0000 mg | ORAL_TABLET | Freq: Every day | ORAL | Status: DC
  Administered 2018-04-28 – 2018-04-30 (×3): 50 mg via ORAL
  Filled 2018-04-28 (×3): qty 1

## 2018-04-28 NOTE — Progress Notes (Signed)
[place] [facility] [place] NAME: Jenny Hendrix    MR#:  [id number]  DATE OF BIRTH:  [birth year]  SUBJECTIVE:  CHIEF COMPLAINT:   Chief Complaint  Patient presents with  . Fever   Patient was hypoxic requiring Ventimask overnight.  Had an episode of vomiting and Zofran was given this morning.  Currently resting comfortably.  Being treated for multifocal pneumonia.  REVIEW OF SYSTEMS:  ROS  Reviewed of system unremarkable apart from findings mentioned above  DRUG ALLERGIES:   Allergies  Allergen Reactions  . Benzonatate Diarrhea  . Metronidazole Other (See Comments)    Body aches, all side effects    VITALS:  Blood pressure 113/64, pulse 83, temperature 98.4 F (36.9 C), temperature source Oral, resp. rate 20, height 5\' 3"  (1.6 m), weight 99.4 kg, SpO2 94 %, not currently breastfeeding. PHYSICAL EXAMINATION:  Physical Exam Constitutional:      General: She is not in acute distress.    Appearance: She is not diaphoretic.     Comments: Obese  HENT:     Head: Normocephalic.  Eyes:     Conjunctiva/sclera: Conjunctivae normal.     Pupils: Pupils are equal, round, and reactive to light.  Neck:     Musculoskeletal: Normal range of motion and neck supple.     Thyroid: No thyromegaly.     Vascular: No JVD.  Cardiovascular:     Rate and Rhythm: Normal rate and regular rhythm.     Heart sounds: Normal heart sounds.  Pulmonary:     Effort: Pulmonary effort is normal.     Breath sounds: Rales present.  Abdominal:     General: There is no distension.     Tenderness: There is no abdominal tenderness. There is no rebound.  Musculoskeletal: Normal range of motion.        General: No edema.  Skin:    General: Skin is warm and dry.  Neurological:     Mental Status: She is alert and oriented to person, place, and time.      Physical Exam  Constitutional: She is oriented to person, place, and time and well-developed, well-nourished, and  in no distress. No distress.  Obese  HENT:  Head: Normocephalic.  Eyes: Pupils are equal, round, and reactive to light. Conjunctivae are normal.  Neck: Normal range of motion. Neck supple. No JVD present. No thyromegaly present.  Cardiovascular: Normal rate, regular rhythm and normal heart sounds.  Pulmonary/Chest: Effort normal. She has rales.  Abdominal: She exhibits no distension. There is no abdominal tenderness. There is no rebound.  Musculoskeletal: Normal range of motion.        General: No edema.  Neurological: She is alert and oriented to person, place, and time.  Skin: Skin is warm and dry. She is not diaphoretic.   LABORATORY PANEL:  Female CBC Recent Labs  Lab 04/28/18 0559  WBC 12.2*  HGB 10.6*  HCT 33.9*  PLT 331   ------------------------------------------------------------------------------------------------------------------ Chemistries  Recent Labs  Lab 04/27/18 2321 04/28/18 0559  NA 137 137  K 3.1* 3.2*  CL 105 105  CO2 24 24  GLUCOSE 110* 97  BUN 11 8  CREATININE 0.62 0.56  CALCIUM 8.5* 7.9*  AST 18  --   ALT 16  --   ALKPHOS 81  --   BILITOT 0.5  --    RADIOLOGY:  Dg Chest 2 View  Result Date: 04/27/2018 CLINICAL DATA:  Fever EXAM: CHEST - 2 VIEW  COMPARISON:  06/11/2015 report FINDINGS: Diffuse bilateral interstitial and ground-glass opacity. No pleural effusion. Normal heart size. No pneumothorax. IMPRESSION: Diffuse interstitial and ground-glass opacity bilaterally, may reflect diffuse pneumonia given history of fever. Electronically Signed   By: Donavan Foil M.D.   On: 04/27/2018 22:59   ASSESSMENT AND PLAN:   Patient is a 38 year old female who presented to the emergency room with 3-day history of fevers and chills with shortness of breath.  Also had cough chills mostly nonproductive.  Was evaluated in the emergency room and diagnosed with sepsis secondary to multifocal pneumonia.  Further treatment as outlined below  1.   Sepsis ([place])  -as manifested by febrile, tachypnia, with leukocytosis.  Sepsis secondary to multifocal pneumonia.  Lactic acid level was normal.  Patient otherwise hemodynamically stable.  Continue IV fluids. Continue broad-spectrum IV antibiotics with IV azithromycin and Rocephin and monitor clinically.  PRN Robitussin. Supplemental oxygen. Follow-up on cultures.    2.  CAP (community acquired pneumonia) -IV antibiotics as above, other supportive treatment Monitor clinically and follow-up on cultures Influenza test negative.  3.   Anxiety and depression -home dose anxiolytic and antidepressant    4. GERD (gastroesophageal reflux disease) -home dose PPI  5. Hypothyroidism -home dose thyroid replacement TSH level in a.m.  6.  Hypoxic respiratory failure secondary to multifocal pneumonia  Chart review performedand case discussed with ED provider. Labs, imaging and/or ECG reviewed by provider and discussed with patient/family. Management plans discussed with the patient and/or family.  DVT PROPHYLAXIS: SubQ lovenox   GI PROPHYLAXIS:     PPI   ADMISSION STATUS: Inpatient     CODE STATUS: Full     All the records are reviewed and case discussed with Care Management/Social Worker. Management plans discussed with the patient, family and they are in agreement.  CODE STATUS: Full Code  TOTAL TIME TAKING CARE OF THIS PATIENT: 36 minutes.   More than 50% of the time was spent in counseling/coordination of care: YES  POSSIBLE D/C IN 2-3 DAYS, DEPENDING ON CLINICAL CONDITION.   Jenny Hendrix M.D on 04/28/2018 at 12:21 PM  Between 7am to 6pm - Pager - [phone]  After 6pm go to [email or web address] - Proofreader  [facility]  Office  [phone]  CC: Primary care physician; Jenny Haggard, FNP  Note: This dictation was prepared with Dragon dictation along with smaller phrase technology. Any transcriptional errors that result from this process are  unintentional.

## 2018-04-28 NOTE — H&P (Signed)
[place] at [place] NAME: Jenny Hendrix    MR#:  [id number]  DATE OF BIRTH:  07/15/1980  DATE OF ADMISSION:  04/27/2018  PRIMARY CARE PHYSICIAN: Remi Haggard, FNP   REQUESTING/REFERRING PHYSICIAN: Owens Shark, MD  CHIEF COMPLAINT:   Chief Complaint  Patient presents with  . Fever    HISTORY OF PRESENT ILLNESS:  Jenny Hendrix  is a 37 y.o. female who presents with chief complaint as above.  Patient presents today with worsening shortness of breath and cough.  She states that 3 days ago she developed fever with chills that lasted for a little over 24 hours.  Since then her shortness of breath has progressed significantly, and she has progressive cough as well, nonproductive.  She finally came to the ED for evaluation today, and is found here to have multifocal pneumonia.  She meets sepsis criteria.  Hospitalist were called for admission  PAST MEDICAL HISTORY:   Past Medical History:  Diagnosis Date  . Anxiety   . ASCUS of cervix with negative high risk HPV 05/22/2016  . Depression   . HSV infection   . Hypothyroid   . Hypothyroidism   . Ovarian cyst      PAST SURGICAL HISTORY:   Past Surgical History:  Procedure Laterality Date  . CESAREAN SECTION N/A 09/06/2017   Procedure: CESAREAN SECTION;  Surgeon: Malachy Mood, MD;  Location: [facility] ORS;  Service: Obstetrics;  Laterality: N/A;  . DILATION AND EVACUATION N/A 06/19/2016   Procedure: DILATATION AND EVACUATION;  Surgeon: Gae Dry, MD;  Location: [facility] ORS;  Service: Gynecology;  Laterality: N/A;  . TONSILLECTOMY AND ADENOIDECTOMY    . WISDOM TOOTH EXTRACTION       SOCIAL HISTORY:   Social History   Tobacco Use  . Smoking status: Never Smoker  . Smokeless tobacco: Never Used  Substance Use Topics  . Alcohol use: No     FAMILY HISTORY:   Family History  Problem Relation Age of Onset  . Diabetes Mother   . Hyperlipidemia Maternal Grandmother    . Hypertension Maternal Grandmother   . Breast cancer Maternal Grandmother   . Thyroid disease Maternal Grandmother      DRUG ALLERGIES:   Allergies  Allergen Reactions  . Benzonatate Diarrhea  . Metronidazole Other (See Comments)    Body aches, all side effects     MEDICATIONS AT HOME:   Prior to Admission medications   Medication Sig Start Date End Date Taking? Authorizing Provider  Ascorbic Acid (VITAMIN C) 500 MG CHEW Chew 500 mg by mouth daily.   Yes [provider]  clonazePAM (KLONOPIN) 1 MG tablet Take 1 mg by mouth 3 (three) times daily as needed.  02/02/18  Yes [provider]  levothyroxine (SYNTHROID, LEVOTHROID) 50 MCG tablet Take 1 tablet (50 mcg total) by mouth daily before breakfast. 11/07/17  Yes Malachy Mood, MD  omeprazole (PRILOSEC) 40 MG capsule Take 40 mg by mouth at bedtime.    Yes [provider]  sertraline (ZOLOFT) 100 MG tablet Take 1 tablet (100 mg total) by mouth daily. Patient taking differently: Take 50 mg by mouth daily with lunch.  11/06/17  Yes Malachy Mood, MD  HYDROcodone-acetaminophen (NORCO/VICODIN) 5-325 MG tablet Take 1 tablet by mouth every 6 (six) hours as needed. Patient not taking: Reported on 04/28/2018 74/0/81   Copland, Deirdre Evener, PA-C  levonorgestrel (MIRENA) 20 MCG/24HR IUD 1 Intra Uterine Device (1 each total) by Intrauterine route  once for 1 dose. 03/01/18 01/28/71  Copland, Deirdre Evener, PA-C    REVIEW OF SYSTEMS:  Review of Systems  Constitutional: Positive for chills and fever. Negative for malaise/fatigue and weight loss.  HENT: Negative for ear pain, hearing loss and tinnitus.   Eyes: Negative for blurred vision, double vision, pain and redness.  Respiratory: Positive for cough and shortness of breath. Negative for hemoptysis.   Cardiovascular: Negative for chest pain, palpitations, orthopnea and leg swelling.  Gastrointestinal: Negative for abdominal pain, constipation, diarrhea, nausea and  vomiting.  Genitourinary: Negative for dysuria, frequency and hematuria.  Musculoskeletal: Negative for back pain, joint pain and neck pain.  Skin:       No acne, rash, or lesions  Neurological: Negative for dizziness, tremors, focal weakness and weakness.  Endo/Heme/Allergies: Negative for polydipsia. Does not bruise/bleed easily.  Psychiatric/Behavioral: Negative for depression. The patient is not nervous/anxious and does not have insomnia.      VITAL SIGNS:   Vitals:   04/27/18 2230 04/27/18 2231 04/27/18 2232 04/27/18 2300  BP: 129/76   127/78  Pulse: (!) 101  94 88  Resp:      Temp:      TempSrc:      SpO2: (!) 89% 94% 95% 93%  Weight:      Height:       Wt Readings from Last 3 Encounters:  04/27/18 96.2 kg  03/09/18 98 kg  03/01/18 97.5 kg    PHYSICAL EXAMINATION:  Physical Exam  Vitals reviewed. Constitutional: She is oriented to person, place, and time. She appears well-developed and well-nourished. No distress.  HENT:  Head: Normocephalic and atraumatic.  Mouth/Throat: Oropharynx is clear and moist.  Eyes: Pupils are equal, round, and reactive to light. Conjunctivae and EOM are normal. No scleral icterus.  Neck: Normal range of motion. Neck supple. No JVD present. No thyromegaly present.  Cardiovascular: Normal rate, regular rhythm and intact distal pulses. Exam reveals no gallop and no friction rub.  No murmur heard. Respiratory: Effort normal. No respiratory distress. She has no wheezes. She has no rales.  Scattered rhonchi  GI: Soft. Bowel sounds are normal. She exhibits no distension. There is no abdominal tenderness.  Musculoskeletal: Normal range of motion.        General: No edema.     Comments: No arthritis, no gout  Lymphadenopathy:    She has no cervical adenopathy.  Neurological: She is alert and oriented to person, place, and time. No cranial nerve deficit.  No dysarthria, no aphasia  Skin: Skin is warm and dry. No rash noted. No erythema.   Psychiatric: She has a normal mood and affect. Her behavior is normal. Judgment and thought content normal.    LABORATORY PANEL:   CBC Recent Labs  Lab 04/27/18 2321  WBC 14.9*  HGB 11.2*  HCT 35.2*  PLT 395   ------------------------------------------------------------------------------------------------------------------  Chemistries  Recent Labs  Lab 04/27/18 2321  NA 137  K 3.1*  CL 105  CO2 24  GLUCOSE 110*  BUN 11  CREATININE 0.62  CALCIUM 8.5*  AST 18  ALT 16  ALKPHOS 81  BILITOT 0.5   ------------------------------------------------------------------------------------------------------------------  Cardiac Enzymes No results for input(s): TROPONINI in the last 168 hours. ------------------------------------------------------------------------------------------------------------------  RADIOLOGY:  Dg Chest 2 View  Result Date: 04/27/2018 CLINICAL DATA:  Fever EXAM: CHEST - 2 VIEW COMPARISON:  06/11/2015 report FINDINGS: Diffuse bilateral interstitial and ground-glass opacity. No pleural effusion. Normal heart size. No pneumothorax. IMPRESSION: Diffuse interstitial and ground-glass opacity bilaterally,  may reflect diffuse pneumonia given history of fever. Electronically Signed   By: Donavan Foil M.D.   On: 04/27/2018 22:59    EKG:   Orders placed or performed during the hospital encounter of 04/27/18  . ED EKG 12-Lead  . ED EKG 12-Lead    IMPRESSION AND PLAN:  Principal Problem:   Sepsis ([place]) -febrile, tachypneic, with leukocytosis.  Because of sepsis is community-acquired pneumonia as below.  Lactic acid was within normal limits.  Blood pressure is stable.  IV antibiotics initiated.  Cultures sent from the ED. Active Problems:   CAP (community acquired pneumonia) -IV antibiotics as above, other supportive treatment   Anxiety and depression -home dose anxiolytic and antidepressant   GERD (gastroesophageal reflux disease) -home dose PPI    Hypothyroidism -home dose thyroid replacement  Chart review performed and case discussed with ED provider. Labs, imaging and/or ECG reviewed by provider and discussed with patient/family. Management plans discussed with the patient and/or family.  DVT PROPHYLAXIS: SubQ lovenox   GI PROPHYLAXIS:  PPI   ADMISSION STATUS: Inpatient     CODE STATUS: Full Code Status History    Date Active Date Inactive Code Status Order ID Comments User Context   09/05/2017 0936 09/06/2017 2223 Full Code [id number]  Rexene Agent, CNM Inpatient      TOTAL TIME TAKING CARE OF THIS PATIENT: 45 minutes.   Jannifer Franklin, Xayden Linsey [place] 04/28/2018, 12:45 AM  [facility]  Office  [phone]  CC: Primary care physician; Remi Haggard, FNP  Note:  This document was prepared using Dragon voice recognition software and may include unintentional dictation errors.

## 2018-04-28 NOTE — ED Notes (Signed)
ED TO INPATIENT HANDOFF REPORT  Name/Age/Gender Jenny Hendrix 38 y.o. female  Code Status Code Status History    Date Active Date Inactive Code Status Order ID Comments User Context   09/05/2017 0936 09/06/2017 2223 Full Code [id number]  Rexene Agent, CNM Inpatient      Home/SNF/Other home  Chief Complaint sob  Level of Care/Admitting Diagnosis ED Disposition    ED Disposition Condition [place]: [place] [[id number]]  Level of Care: Med-Surg [16]  Diagnosis: Sepsis [facility]) [[id number]]  Admitting Physician: Lance Coon [[id number]]  Attending Physician: Lance Coon [phone]  Estimated length of stay: past midnight tomorrow  Certification:: I certify this patient will need inpatient services for at least 2 midnights  PT Class (Do Not Modify): Inpatient [101]  PT Acc Code (Do Not Modify): Private [1]       Medical History Past Medical History:  Diagnosis Date  . Anxiety   . ASCUS of cervix with negative high risk HPV 05/22/2016  . Depression   . HSV infection   . Hypothyroid   . Hypothyroidism   . Ovarian cyst     Allergies Allergies  Allergen Reactions  . Benzonatate Diarrhea  . Metronidazole Other (See Comments)    Body aches, all side effects     IV Location/Drains/Wounds Patient Lines/Drains/Airways Status   Active Line/Drains/Airways    Name:   Placement date:   Placement time:   Site:   Days:   Peripheral IV 04/27/18 Left Antecubital   04/27/18    2330    Antecubital   1   Peripheral IV 04/27/18 Left Forearm   04/27/18    2330    Forearm   1   Pain Pump Right Abdomen   09/06/17    2128     234   Incision (Closed) 09/06/17 Abdomen Other (Comment)   09/06/17    2138     234          Labs/Imaging Results for orders placed or performed during the hospital encounter of 04/27/18 (from the past 48 hour(s))  CBC     Status: Abnormal   Collection Time: 04/27/18 11:21 PM  Result Value Ref Range   WBC  14.9 (H) 4.0 - 10.5 K/uL   RBC 3.80 (L) 3.87 - 5.11 MIL/uL   Hemoglobin 11.2 (L) 12.0 - 15.0 g/dL   HCT 35.2 (L) 36.0 - 46.0 %   MCV 92.6 80.0 - 100.0 fL   MCH 29.5 26.0 - 34.0 pg   MCHC 31.8 30.0 - 36.0 g/dL   RDW 13.6 11.5 - 15.5 %   Platelets 395 150 - 400 K/uL   nRBC 0.0 0.0 - 0.2 %    Comment: Performed at [facility], [address] [place], [place] [address]  Comprehensive metabolic panel     Status: Abnormal   Collection Time: 04/27/18 11:21 PM  Result Value Ref Range   Sodium 137 135 - 145 mmol/L   Potassium 3.1 (L) 3.5 - 5.1 mmol/L   Chloride 105 98 - 111 mmol/L   CO2 24 22 - 32 mmol/L   Glucose, Bld 110 (H) 70 - 99 mg/dL   BUN 11 6 - 20 mg/dL   Creatinine, Ser 0.62 0.44 - 1.00 mg/dL   Calcium 8.5 (L) 8.9 - 10.3 mg/dL   Total Protein 7.9 6.5 - 8.1 g/dL   Albumin 3.8 3.5 - 5.0 g/dL   AST 18 15 - 41 U/L  ALT 16 0 - 44 U/L   Alkaline Phosphatase 81 38 - 126 U/L   Total Bilirubin 0.5 0.3 - 1.2 mg/dL   GFR calc non Af Amer >60 >60 mL/min   GFR calc Af Amer >60 >60 mL/min   Anion gap 8 5 - 15    Comment: Performed at [facility], [address], [place], [place] [address]  Influenza panel by PCR (type A & B)     Status: None   Collection Time: 04/27/18 11:21 PM  Result Value Ref Range   Influenza A By PCR NEGATIVE NEGATIVE   Influenza B By PCR NEGATIVE NEGATIVE    Comment: (NOTE) The Xpert Xpress Flu assay is intended as an aid in the diagnosis of  influenza and should not be used as a sole basis for treatment.  This  assay is FDA approved for nasopharyngeal swab specimens only. Nasal  washings and aspirates are unacceptable for Xpert Xpress Flu testing. Performed at [facility], [address] [place], [place] [address]   Lactic acid, plasma     Status: None   Collection Time: 04/27/18 11:21 PM  Result Value Ref Range   Lactic Acid, Venous 1.7 0.5 - 1.9 mmol/L    Comment: Performed at [facility], [address] [place], [place] [address]  Urinalysis, Complete w Microscopic     Status: Abnormal   Collection Time: 04/27/18 11:23 PM  Result Value Ref Range   Color, Urine YELLOW (A) YELLOW   APPearance CLEAR (A) CLEAR   Specific Gravity, Urine 1.031 (H) 1.005 - 1.030   pH 6.0 5.0 - 8.0   Glucose, UA NEGATIVE NEGATIVE mg/dL   Hgb urine dipstick SMALL (A) NEGATIVE   Bilirubin Urine NEGATIVE NEGATIVE   Ketones, ur NEGATIVE NEGATIVE mg/dL   Protein, ur 100 (A) NEGATIVE mg/dL   Nitrite NEGATIVE NEGATIVE   Leukocytes, UA NEGATIVE NEGATIVE   RBC / HPF 0-5 0 - 5 RBC/hpf   WBC, UA 0-5 0 - 5 WBC/hpf   Bacteria, UA NONE SEEN NONE SEEN   Squamous Epithelial / LPF 0-5 0 - 5   Mucus PRESENT     Comment: Performed at [facility], [address] [place], [place] [address]   Dg Chest 2 View  Result Date: 04/27/2018 CLINICAL DATA:  Fever EXAM: CHEST - 2 VIEW COMPARISON:  06/11/2015 report FINDINGS: Diffuse bilateral interstitial and ground-glass opacity. No pleural effusion. Normal heart size. No pneumothorax. IMPRESSION: Diffuse interstitial and ground-glass opacity bilaterally, may reflect diffuse pneumonia given history of fever. Electronically Signed   By: Donavan Foil M.D.   On: 04/27/2018 22:59    Pending Labs Unresulted Labs (From admission, onward)    Start     Ordered   04/27/18 2258  Blood culture (routine x 2)  BLOOD CULTURE X 2,   STAT     04/27/18 2257   Signed and Held  HIV antibody (Routine Testing)  Once,   R     Signed and Held   Signed and Held  CBC  (enoxaparin (LOVENOX)    CrCl >/= 30 ml/min)  Once,   R    Comments:  Baseline for enoxaparin therapy IF NOT ALREADY DRAWN.  Notify MD if PLT < 100 K.    Signed and Held   Signed and Held  Creatinine, serum  (enoxaparin (LOVENOX)    CrCl >/= 30 ml/min)  Once,   R    Comments:  Baseline for enoxaparin therapy IF  NOT ALREADY DRAWN.    Signed and Held   Signed and Held  Creatinine, serum  (enoxaparin (LOVENOX)    CrCl >/= 30  ml/min)  Weekly,   R    Comments:  while on enoxaparin therapy    Signed and Held   Signed and Held  Basic metabolic panel  Tomorrow morning,   R     Signed and Held   Signed and Held  CBC  Tomorrow morning,   R     Signed and Held          Vitals/Pain Today's Vitals   04/28/18 0130 04/28/18 0230 04/28/18 0240 04/28/18 0241  BP: 121/80 137/76    Pulse: 81 99 93   Resp:      Temp:      TempSrc:      SpO2: 90% 94% (!) 84% 95%  Weight:      Height:      PainSc:        Isolation Precautions No active isolations  Medications Medications  ipratropium-albuterol (DUONEB) 0.5-2.5 (3) MG/3ML nebulizer solution 3 mL (3 mLs Nebulization Given 04/28/18 0228)  clonazePAM (KLONOPIN) tablet 1 mg (1 mg Oral Given 04/28/18 0225)  acetaminophen (TYLENOL) tablet 650 mg (has no administration in time range)    Or  acetaminophen (TYLENOL) suppository 650 mg (has no administration in time range)  guaiFENesin-dextromethorphan (ROBITUSSIN DM) 100-10 MG/5ML syrup 5 mL (5 mLs Oral Given 04/28/18 0318)  ipratropium-albuterol (DUONEB) 0.5-2.5 (3) MG/3ML nebulizer solution 3 mL (3 mLs Nebulization Given 04/27/18 2347)  cefTRIAXone (ROCEPHIN) 1 g in sodium chloride 0.9 % 100 mL IVPB (0 g Intravenous Stopped 04/28/18 0031)  azithromycin (ZITHROMAX) 500 mg in sodium chloride 0.9 % 250 mL IVPB (0 mg Intravenous Stopped 04/28/18 0047)  ondansetron (ZOFRAN) injection 4 mg (4 mg Intravenous Given 04/28/18 0033)    Mobility independent

## 2018-04-28 NOTE — Progress Notes (Signed)
This RT called to assess new admission with SpO2 80% on O2 via nasal cannula at 6LPM.  Pt. Was asleep and snoring upon entry to room.  Pt. Awoke easily when called by name. After arousing pt. SpO2 immediately improved to 94%.  Pt. Placed on 55% venturi mask to maintain SpO2 93% while asleep. Pt. Has never been diagnosed with sleep apnea; however this is likely cause of desaturation while asleep. Will continue to assess.

## 2018-04-28 NOTE — Progress Notes (Signed)
Pt found taking venimask off multiple occasions with education done and reinforced with 02 desats when off. Reports severe HA with tylenol given which decreased. Pt reports she normally take ibuprofen for her HA's with order obtained and given. Pt very upset that she is in hospital away from her child/family; tearful with emotional support and klonipin given. Taking short naps with noted snoring and sleep apnea type breathing. VSS. No acute distress. Pt states she feels really bad and misses her family. Husband in and supportive of pt.

## 2018-04-28 NOTE — ED Provider Notes (Signed)
[facility] Emergency Department Provider Note _____________   First MD Initiated Contact with Patient 04/28/18 0040     (approximate)  I have reviewed the triage vital signs and the nursing notes.   HISTORY  Chief Complaint Fever    HPI Jenny Hendrix is a 38 y.o. female with below list of chronic medical conditions presents to the emergency department with 1 day history of cough dyspnea fever and congestion.  Patient's oxygen saturation 83% on room air on arrival.  Patient states that she had a flu swab performed which is negative in addition patient states that she received a nebulizer treatment at work today.  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   Past Medical History:  Diagnosis Date  . Anxiety   . ASCUS of cervix with negative high risk HPV 05/22/2016  . Depression   . HSV infection   . Hypothyroid   . Hypothyroidism   . Ovarian cyst     Patient Active Problem List   Diagnosis Date Noted  . Sepsis ([place]) 04/28/2018  . CAP (community acquired pneumonia) 04/28/2018  . Cyst of left ovary 03/10/2018  . Encounter for planned induction of labor 09/05/2017  . Labial lesion 08/13/2017  . Pregnancy 08/13/2017  . Vomiting and diarrhea 06/01/2017  . Anxiety and depression 01/27/2017  . Supervision of high risk pregnancy, antepartum 01/27/2017  . Obesity in pregnancy 01/27/2017  . GERD (gastroesophageal reflux disease) 01/27/2017  . Hypothyroidism 01/27/2017  . Pelvic pain in female 01/12/2017    Past Surgical History:  Procedure Laterality Date  . CESAREAN SECTION N/A 09/06/2017   Procedure: CESAREAN SECTION;  Surgeon: Malachy Mood, MD;  Location: [facility] ORS;  Service: Obstetrics;  Laterality: N/A;  . DILATION AND EVACUATION N/A 06/19/2016   Procedure: DILATATION AND EVACUATION;  Surgeon: Gae Dry, MD;  Location: [facility] ORS;  Service: Gynecology;  Laterality: N/A;  . TONSILLECTOMY AND ADENOIDECTOMY      . WISDOM TOOTH EXTRACTION      Prior to Admission medications   Medication Sig Start Date End Date Taking? Authorizing Provider  Ascorbic Acid (VITAMIN C) 500 MG CHEW Chew 500 mg by mouth daily.   Yes [provider]  clonazePAM (KLONOPIN) 1 MG tablet Take 1 mg by mouth 3 (three) times daily as needed.  02/02/18  Yes [provider]  levothyroxine (SYNTHROID, LEVOTHROID) 50 MCG tablet Take 1 tablet (50 mcg total) by mouth daily before breakfast. 11/07/17  Yes Malachy Mood, MD  omeprazole (PRILOSEC) 40 MG capsule Take 40 mg by mouth at bedtime.    Yes [provider]  sertraline (ZOLOFT) 100 MG tablet Take 1 tablet (100 mg total) by mouth daily. Patient taking differently: Take 50 mg by mouth daily with lunch.  11/06/17  Yes Malachy Mood, MD  HYDROcodone-acetaminophen (NORCO/VICODIN) 5-325 MG tablet Take 1 tablet by mouth every 6 (six) hours as needed. Patient not taking: Reported on 04/28/2018 40/1/02   Copland, Deirdre Evener, PA-C  levonorgestrel (MIRENA) 20 MCG/24HR IUD 1 Intra Uterine Device (1 each total) by Intrauterine route once for 1 dose. 03/01/18 72/5/36  Copland, Deirdre Evener, PA-C    Allergies Benzonatate and Metronidazole  Family History  Problem Relation Age of Onset  . Diabetes Mother   . Hyperlipidemia Maternal Grandmother   . Hypertension Maternal Grandmother   . Breast cancer Maternal Grandmother   . Thyroid disease Maternal Grandmother     Social History Social History   Tobacco  Use  . Smoking status: Never Smoker  . Smokeless tobacco: Never Used  Substance Use Topics  . Alcohol use: No  . Drug use: No    Review of Systems Constitutional: No fever/chills Eyes: No visual changes. ENT: No sore throat. Cardiovascular: Denies chest pain. Respiratory: Positive for cough and dyspnea Gastrointestinal: No abdominal pain.  No nausea, no vomiting.  No diarrhea.  No constipation. Genitourinary: Negative for dysuria. Musculoskeletal:  Negative for neck pain.  Negative for back pain. Integumentary: Negative for rash. Neurological: Negative for headaches, focal weakness or numbness.  ____________________________________________   PHYSICAL EXAM:  VITAL SIGNS: ED Triage Vitals  Enc Vitals Group     BP 04/27/18 2123 129/80     Pulse Rate 04/27/18 2123 93     Resp 04/27/18 2123 (!) 24     Temp 04/27/18 2123 (!) 100.4 F (38 C)     Temp Source 04/27/18 2123 Oral     SpO2 04/27/18 2123 92 %     Weight 04/27/18 2124 96.2 kg (212 lb)     Height 04/27/18 2124 1.6 m (5\' 3" )     Head Circumference --      Peak Flow --      Pain Score 04/27/18 2124 5     Pain Loc --      Pain Edu? --      Excl. in [place]? --     Constitutional: Alert and oriented. Eyes: Conjunctivae are normal.  Head: Atraumatic. Mouth/Throat: Mucous membranes are moist.  Oropharynx non-erythematous. Neck: No stridor.   Cardiovascular: Normal rate, regular rhythm. Good peripheral circulation. Grossly normal heart sounds. Respiratory: Normal respiratory effort.  No retractions.  Diffuse rhonchi Gastrointestinal: Soft and nontender. No distention.  Musculoskeletal: No lower extremity tenderness nor edema. No gross deformities of extremities. Neurologic:  Normal speech and language. No gross focal neurologic deficits are appreciated.  Skin:  Skin is warm, dry and intact. No rash noted. Psychiatric: Mood and affect are normal. Speech and behavior are normal.  ____________________________________________   LABS (all labs ordered are listed, but only abnormal results are displayed)  Labs Reviewed  CBC - Abnormal; Notable for the following components:      Result Value   WBC 14.9 (*)    RBC 3.80 (*)    Hemoglobin 11.2 (*)    HCT 35.2 (*)    All other components within normal limits  COMPREHENSIVE METABOLIC PANEL - Abnormal; Notable for the following components:   Potassium 3.1 (*)    Glucose, Bld 110 (*)    Calcium 8.5 (*)    All other components  within normal limits  URINALYSIS, COMPLETE (UACMP) WITH MICROSCOPIC - Abnormal; Notable for the following components:   Color, Urine YELLOW (*)    APPearance CLEAR (*)    Specific Gravity, Urine 1.031 (*)    Hgb urine dipstick SMALL (*)    Protein, ur 100 (*)    All other components within normal limits  CULTURE, BLOOD (ROUTINE X 2)  CULTURE, BLOOD (ROUTINE X 2)  INFLUENZA PANEL BY PCR (TYPE A & B)  LACTIC ACID, PLASMA  LACTIC ACID, PLASMA   ____________________________________________  EKG ED ECG REPORT I, [place] N BROWN, the attending physician, personally viewed and interpreted this ECG.   Date: 04/27/2018  EKG Time: 1:41 PM   Rate: 91  Rhythm: Normal sinus rhythm  Axis: Normal  Intervals:Normal  ST&T Change: None   ____________________________________________  RADIOLOGY I,  N BROWN, personally viewed and evaluated these images (plain  radiographs) as part of my medical decision making, as well as reviewing the written report by the radiologist.  ED MD interpretation:    Official radiology report(s): Dg Chest 2 View  Result Date: 04/27/2018 CLINICAL DATA:  Fever EXAM: CHEST - 2 VIEW COMPARISON:  06/11/2015 report FINDINGS: Diffuse bilateral interstitial and ground-glass opacity. No pleural effusion. Normal heart size. No pneumothorax. IMPRESSION: Diffuse interstitial and ground-glass opacity bilaterally, may reflect diffuse pneumonia given history of fever. Electronically Signed   By: Donavan Foil M.D.   On: 04/27/2018 22:59      Critical Care performed:    .Critical Care Performed by: Gregor Hams, MD Authorized by: Gregor Hams, MD   Critical care provider statement:    Critical care time (minutes):  30   Critical care time was exclusive of:  Separately billable procedures and treating other patients   Critical care was necessary to treat or prevent imminent or life-threatening deterioration of the following conditions:  Sepsis    Critical care was time spent personally by me on the following activities:  Development of treatment plan with patient or surrogate, discussions with consultants, evaluation of patient's response to treatment, examination of patient, obtaining history from patient or surrogate, ordering and performing treatments and interventions, ordering and review of laboratory studies, ordering and review of radiographic studies, pulse oximetry, re-evaluation of patient's condition and review of old charts     ____________________________________________   INITIAL IMPRESSION / ASSESSMENT AND PLAN / ED COURSE  As part of my medical decision making, I reviewed the following data within the electronic MEDICAL RECORD NUMBER  38 year old female presented with above-stated history and physical exam concerning for pneumonia, influenza.  Patient's meet criteria for sepsis as she is febrile, tachypneic hypoxic with leukocytosis.  Patient given IV ceftriaxone and azithromycin after evaluating the patient's chest x-ray which is consistent with multifocal pneumonia.  Patient is influenza negative.  Patient discussed with Dr. Jannifer Franklin for hospital admission further evaluation and management. ____________________________________________  FINAL CLINICAL IMPRESSION(S) / ED DIAGNOSES  Final diagnoses:  Sepsis, due to unspecified organism, unspecified whether acute organ dysfunction present [facility])  Community acquired pneumonia, unspecified laterality     MEDICATIONS GIVEN DURING THIS VISIT:  Medications  ipratropium-albuterol (DUONEB) 0.5-2.5 (3) MG/3ML nebulizer solution 3 mL (3 mLs Nebulization Given 04/27/18 2347)  cefTRIAXone (ROCEPHIN) 1 g in sodium chloride 0.9 % 100 mL IVPB (0 g Intravenous Stopped 04/28/18 0031)  azithromycin (ZITHROMAX) 500 mg in sodium chloride 0.9 % 250 mL IVPB (0 mg Intravenous Stopped 04/28/18 0047)  ondansetron (ZOFRAN) injection 4 mg (4 mg Intravenous Given 04/28/18 0033)     ED Discharge Orders     None       Note:  This document was prepared using Dragon voice recognition software and may include unintentional dictation errors.    Gregor Hams, MD 04/28/18 [phone]

## 2018-04-29 ENCOUNTER — Inpatient Hospital Stay: Payer: 59

## 2018-04-29 LAB — BASIC METABOLIC PANEL
Anion gap: 6 (ref 5–15)
BUN: 7 mg/dL (ref 6–20)
CO2: 26 mmol/L (ref 22–32)
Calcium: 8.1 mg/dL — ABNORMAL LOW (ref 8.9–10.3)
Chloride: 106 mmol/L (ref 98–111)
Creatinine, Ser: 0.52 mg/dL (ref 0.44–1.00)
Glucose, Bld: 90 mg/dL (ref 70–99)
Potassium: 3.5 mmol/L (ref 3.5–5.1)
Sodium: 138 mmol/L (ref 135–145)

## 2018-04-29 LAB — TSH: TSH: 3.823 u[IU]/mL (ref 0.350–4.500)

## 2018-04-29 LAB — CBC
HCT: 33.4 % — ABNORMAL LOW (ref 36.0–46.0)
Hemoglobin: 10.1 g/dL — ABNORMAL LOW (ref 12.0–15.0)
MCH: 28.9 pg (ref 26.0–34.0)
MCHC: 30.2 g/dL (ref 30.0–36.0)
MCV: 95.4 fL (ref 80.0–100.0)
Platelets: 335 10*3/uL (ref 150–400)
RBC: 3.5 MIL/uL — ABNORMAL LOW (ref 3.87–5.11)
RDW: 13.4 % (ref 11.5–15.5)
WBC: 11.2 10*3/uL — ABNORMAL HIGH (ref 4.0–10.5)
nRBC: 0 % (ref 0.0–0.2)

## 2018-04-29 LAB — MAGNESIUM: MAGNESIUM: 2.1 mg/dL (ref 1.7–2.4)

## 2018-04-29 MED ORDER — PROMETHAZINE HCL 25 MG/ML IJ SOLN
12.5000 mg | Freq: Once | INTRAMUSCULAR | Status: AC
  Administered 2018-04-29: 01:00:00 12.5 mg via INTRAVENOUS
  Filled 2018-04-29: qty 1

## 2018-04-29 MED ORDER — IOHEXOL 350 MG/ML SOLN
75.0000 mL | Freq: Once | INTRAVENOUS | Status: AC | PRN
  Administered 2018-04-29: 13:00:00 75 mL via INTRAVENOUS

## 2018-04-29 MED ORDER — SODIUM CHLORIDE 0.9 % IV SOLN
INTRAVENOUS | Status: DC
  Administered 2018-04-29 – 2018-04-30 (×3): via INTRAVENOUS

## 2018-04-29 MED ORDER — HYDROCOD POLST-CPM POLST ER 10-8 MG/5ML PO SUER
5.0000 mL | Freq: Two times a day (BID) | ORAL | Status: DC | PRN
  Administered 2018-04-29 – 2018-04-30 (×3): 5 mL via ORAL
  Filled 2018-04-29 (×3): qty 5

## 2018-04-29 NOTE — Progress Notes (Signed)
[place] [facility] [place] NAME: Jenny Hendrix    MR#:  [id number]  DATE OF BIRTH:  1981/02/28  SUBJECTIVE:  CHIEF COMPLAINT:   Chief Complaint  Patient presents with  . Fever   Patient still gets hypoxic when she takes of the Ventimask.  Denies any chest pain.  No shortness of breath.  Requesting for attempt to wean down to nasal cannula.  No fevers overnight.   REVIEW OF SYSTEMS:  Review of Systems  Constitutional: Negative for chills and fever.  HENT: Negative for hearing loss and tinnitus.   Eyes: Negative for blurred vision and double vision.  Respiratory: Negative for hemoptysis.        Occasional cough.  Shortness of breath improved.  Cardiovascular: Negative for chest pain and palpitations.  Gastrointestinal: Negative for diarrhea, nausea and vomiting.  Genitourinary: Negative for dysuria and urgency.  Musculoskeletal: Negative for myalgias and neck pain.  Skin: Negative for itching and rash.  Neurological: Negative for dizziness and headaches.  Psychiatric/Behavioral: Negative for depression.       DRUG ALLERGIES:   Allergies  Allergen Reactions  . Benzonatate Diarrhea  . Metronidazole Other (See Comments)    Body aches, all side effects    VITALS:  Blood pressure 122/71, pulse 81, temperature 98.2 F (36.8 C), temperature source Oral, resp. rate 20, height 5\' 3"  (1.6 m), weight 99.4 kg, SpO2 95 %, not currently breastfeeding. PHYSICAL EXAMINATION:     Physical Exam  Constitutional: She is oriented to person, place, and time and well-developed, well-nourished, and in no distress. No distress.  Obese  HENT:  Head: Normocephalic.  Eyes: Pupils are equal, round, and reactive to light. Conjunctivae are normal.  Neck: Normal range of motion. Neck supple. No JVD present. No thyromegaly present.  Cardiovascular: Normal rate, regular rhythm and normal heart sounds.  Pulmonary/Chest: Effort normal. She has no rales.    Rhonchi  Abdominal: She exhibits no distension. There is no abdominal tenderness. There is no rebound.  Musculoskeletal: Normal range of motion.        General: No edema.  Neurological: She is alert and oriented to person, place, and time.  Skin: Skin is warm and dry. She is not diaphoretic.   LABORATORY PANEL:  Female CBC Recent Labs  Lab 04/29/18 0457  WBC 11.2*  HGB 10.1*  HCT 33.4*  PLT 335   ------------------------------------------------------------------------------------------------------------------ Chemistries  Recent Labs  Lab 04/27/18 2321  04/29/18 0457  NA 137   < > 138  K 3.1*   < > 3.5  CL 105   < > 106  CO2 24   < > 26  GLUCOSE 110*   < > 90  BUN 11   < > 7  CREATININE 0.62   < > 0.52  CALCIUM 8.5*   < > 8.1*  MG  --   --  2.1  AST 18  --   --   ALT 16  --   --   ALKPHOS 81  --   --   BILITOT 0.5  --   --    < > = values in this interval not displayed.   RADIOLOGY:  No results found. ASSESSMENT AND PLAN:   Patient is a 38 year old female who presented to the emergency room with 3-day history of fevers and chills with shortness of breath.  Also had cough chills mostly nonproductive.  Was evaluated in the emergency room and diagnosed with sepsis secondary to multifocal  pneumonia.  Further treatment as outlined below  1.   Sepsis ([place]) -as manifested by febrile, tachypnia, with leukocytosis.  Sepsis secondary to multifocal pneumonia.  Lactic acid level was normal.  Patient otherwise hemodynamically stable.  Continue IV fluids. Continue broad-spectrum IV antibiotics with IV azithromycin and Rocephin and monitor clinically.  PRN Robitussin. Supplemental oxygen. Follow-up on cultures.    2.  CAP (community acquired pneumonia) -IV antibiotics as above, other supportive treatment Monitor clinically and follow-up on cultures Influenza test negative.  3.   Anxiety and depression -home dose anxiolytic and antidepressant    4. GERD (gastroesophageal  reflux disease) -home dose PPI  5. Hypothyroidism -home dose thyroid replacement TSH level normal  6.  Hypoxic respiratory failure secondary to multifocal pneumonia Patient requiring Ventimask.  Desaturates easily despite IV antibiotics for the last few days. Requested for CTA chest to rule out pulmonary embolism.  Placed on IV fluid hydration for renal protection.  Chart review performedand case discussed with ED provider. Labs, imaging and/or ECG reviewed by provider and discussed with patient/family. Management plans discussed with the patient and/or family.  DVT PROPHYLAXIS: SubQ lovenox   GI PROPHYLAXIS:     PPI   ADMISSION STATUS: Inpatient     CODE STATUS: Full     All the records are reviewed and case discussed with Care Management/Social Worker. Management plans discussed with the patient, family and they are in agreement.  CODE STATUS: Full Code  TOTAL TIME TAKING CARE OF THIS PATIENT: 36 minutes.   More than 50% of the time was spent in counseling/coordination of care: YES  POSSIBLE D/C IN 2-3 DAYS, DEPENDING ON CLINICAL CONDITION.   Jenny Hendrix M.D on 04/29/2018 at 12:56 PM  Between 7am to 6pm - Pager - [phone]   After 6pm go to [email or web address] - Proofreader  [facility]  Office  [phone]  CC: Primary care physician; Remi Haggard, FNP  Note: This dictation was prepared with Dragon dictation along with smaller phrase technology. Any transcriptional errors that result from this process are unintentional.

## 2018-04-29 NOTE — Progress Notes (Signed)
Pt does not wear CPAP at home and does not want to wear one here. CPAP remains at bedside and RT is available if pt would like. Pt in no apparent respiratory distress at this time.

## 2018-04-30 LAB — CBC
HCT: 32.5 % — ABNORMAL LOW (ref 36.0–46.0)
Hemoglobin: 9.8 g/dL — ABNORMAL LOW (ref 12.0–15.0)
MCH: 28.9 pg (ref 26.0–34.0)
MCHC: 30.2 g/dL (ref 30.0–36.0)
MCV: 95.9 fL (ref 80.0–100.0)
PLATELETS: 340 10*3/uL (ref 150–400)
RBC: 3.39 MIL/uL — ABNORMAL LOW (ref 3.87–5.11)
RDW: 13.3 % (ref 11.5–15.5)
WBC: 11.1 10*3/uL — ABNORMAL HIGH (ref 4.0–10.5)
nRBC: 0 % (ref 0.0–0.2)

## 2018-04-30 LAB — BASIC METABOLIC PANEL
Anion gap: 7 (ref 5–15)
BUN: 5 mg/dL — AB (ref 6–20)
CALCIUM: 8.2 mg/dL — AB (ref 8.9–10.3)
CO2: 27 mmol/L (ref 22–32)
Chloride: 105 mmol/L (ref 98–111)
Creatinine, Ser: 0.57 mg/dL (ref 0.44–1.00)
GFR calc Af Amer: >60 mL/min (ref >60)
GFR calc non Af Amer: >60 mL/min (ref >60)
Glucose, Bld: 92 mg/dL (ref 70–99)
Potassium: 4.1 mmol/L (ref 3.5–5.1)
Sodium: 139 mmol/L (ref 135–145)

## 2018-04-30 LAB — MAGNESIUM: Magnesium: 1.9 mg/dL (ref 1.7–2.4)

## 2018-04-30 LAB — PHOSPHORUS: Phosphorus: 3.9 mg/dL (ref 2.5–4.6)

## 2018-04-30 MED ORDER — SODIUM CHLORIDE 0.9 % IV SOLN
INTRAVENOUS | Status: DC | PRN
  Administered 2018-04-30: 23:00:00 250 mL via INTRAVENOUS

## 2018-04-30 MED ORDER — PROMETHAZINE HCL 25 MG/ML IJ SOLN
12.5000 mg | Freq: Once | INTRAMUSCULAR | Status: AC
  Administered 2018-04-30: 02:00:00 12.5 mg via INTRAVENOUS
  Filled 2018-04-30: qty 1

## 2018-04-30 MED ORDER — PROMETHAZINE HCL 25 MG PO TABS
25.0000 mg | ORAL_TABLET | Freq: Once | ORAL | Status: AC
  Administered 2018-04-30: 22:00:00 25 mg via ORAL
  Filled 2018-04-30: qty 1

## 2018-04-30 NOTE — Progress Notes (Signed)
Pt refuses Cpap. Cpap remains at bedside.

## 2018-04-30 NOTE — Progress Notes (Signed)
[facility] NAME: Jenny Hendrix    MR#:  [id number]  DATE OF BIRTH:  1980/10/28  SUBJECTIVE:  No new complaint this morning.  Patient weaned off Ventimask yesterday.  Currently on oxygen via nasal cannula at 4 L.  Attempted further weaning today but patient became hypoxic and had to be placed back on oxygen.  No fevers overnight.  REVIEW OF SYSTEMS:  Review of Systems  Constitutional: Negative for chills and fever.  HENT: Negative for hearing loss and tinnitus.   Eyes: Negative for blurred vision and double vision.  Respiratory: Negative for hemoptysis.        Occasional cough.  Shortness of breath improved.  Cardiovascular: Negative for chest pain and palpitations.  Gastrointestinal: Negative for diarrhea, nausea and vomiting.  Genitourinary: Negative for dysuria and urgency.  Musculoskeletal: Negative for myalgias and neck pain.  Skin: Negative for itching and rash.  Neurological: Negative for dizziness and headaches.  Psychiatric/Behavioral: Negative for depression.       DRUG ALLERGIES:   Allergies  Allergen Reactions  . Benzonatate Diarrhea  . Metronidazole Other (See Comments)    Body aches, all side effects    VITALS:  Blood pressure 127/74, pulse 83, temperature 98 F (36.7 C), temperature source Oral, resp. rate 20, height 5\' 3"  (1.6 m), weight 99.4 kg, SpO2 92 %, not currently breastfeeding. PHYSICAL EXAMINATION:     Physical Exam  Constitutional: She is oriented to person, place, and time and well-developed, well-nourished, and in no distress. No distress.  Obese  HENT:  Head: Normocephalic.  Eyes: Pupils are equal, round, and reactive to light. Conjunctivae are normal.  Neck: Normal range of motion. Neck supple. No JVD present. No thyromegaly present.  Cardiovascular: Normal rate, regular rhythm and normal heart sounds.  Pulmonary/Chest: Effort normal. She has no rales.  Rhonchi  Abdominal: She  exhibits no distension. There is no abdominal tenderness. There is no rebound.  Musculoskeletal: Normal range of motion.        General: No edema.  Neurological: She is alert and oriented to person, place, and time.  Skin: Skin is warm and dry. She is not diaphoretic.   LABORATORY PANEL:  Female CBC Recent Labs  Lab 04/30/18 0800  WBC 11.1*  HGB 9.8*  HCT 32.5*  PLT 340   ------------------------------------------------------------------------------------------------------------------ Chemistries  Recent Labs  Lab 04/27/18 2321  04/30/18 0800  NA 137   < > 139  K 3.1*   < > 4.1  CL 105   < > 105  CO2 24   < > 27  GLUCOSE 110*   < > 92  BUN 11   < > 5*  CREATININE 0.62   < > 0.57  CALCIUM 8.5*   < > 8.2*  MG  --    < > 1.9  AST 18  --   --   ALT 16  --   --   ALKPHOS 81  --   --   BILITOT 0.5  --   --    < > = values in this interval not displayed.   RADIOLOGY:  No results found. ASSESSMENT AND PLAN:   Patient is a 38 year old female who presented to the emergency room with 3-day history of fevers and chills with shortness of breath.  Also had cough chills mostly nonproductive.  Was evaluated in the emergency room and diagnosed with sepsis secondary to multifocal pneumonia.  Further treatment as outlined below  1.   Sepsis ([place]) -as manifested by febrile, tachypnia, with leukocytosis.  Sepsis secondary to multifocal pneumonia.  Lactic acid level was normal.  Patient otherwise hemodynamically stable.  Continue IV fluids. Continue broad-spectrum IV antibiotics with IV azithromycin and Rocephin and monitor clinically.  PRN Robitussin. Supplemental oxygen. Follow-up on cultures.    2.  CAP (community acquired pneumonia) -IV antibiotics as above, other supportive treatment Monitor clinically and follow-up on cultures Influenza test negative. Plans to transition to p.o. antibiotics on discharge to complete minimum of 7-day treatment duration  3.   Anxiety and depression  -home dose anxiolytic and antidepressant    4. GERD (gastroesophageal reflux disease) -home dose PPI  5. Hypothyroidism -home dose thyroid replacement TSH level normal  6.  Hypoxic respiratory failure secondary to multifocal pneumonia Patient initially requiring Ventimask.  Weaned off Ventimask and currently requiring 4 L of oxygen via nasal cannula.  Patient is not on home oxygen therapy at home.  Anticipate improvement with treatment of pneumonia so that patient may not require oxygen on discharge from the hospital. CTA chest done yesterday was negative for pulmonary embolism.  Revealed bilateral pneumonia.  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  Recommended annual imaging follow-up by CTA. .  DVT PROPHYLAXIS: SubQ lovenox     CODE STATUS: Full   All the records are reviewed and case discussed with Care Management/Social Worker. Management plans discussed with the patient, she is in agreement.  CODE STATUS: Full Code  TOTAL TIME TAKING CARE OF THIS PATIENT: 35 minutes.   More than 50% of the time was spent in counseling/coordination of care: YES  POSSIBLE D/C IN 1-2 DAYS, DEPENDING ON CLINICAL CONDITION.   Jenny Hendrix M.D on 04/30/2018 at 2:51 PM  Between 7am to 6pm - Pager - [phone]   After 6pm go to [email or web address] - Proofreader  [facility]  Office  [phone]  CC: Primary care physician; Remi Haggard, FNP  Note: This dictation was prepared with Dragon dictation along with smaller phrase technology. Any transcriptional errors that result from this process are unintentional.

## 2018-05-01 LAB — CBC
HCT: 32.9 % — ABNORMAL LOW (ref 36.0–46.0)
Hemoglobin: 10 g/dL — ABNORMAL LOW (ref 12.0–15.0)
MCH: 29.1 pg (ref 26.0–34.0)
MCHC: 30.4 g/dL (ref 30.0–36.0)
MCV: 95.6 fL (ref 80.0–100.0)
Platelets: 372 10*3/uL (ref 150–400)
RBC: 3.44 MIL/uL — ABNORMAL LOW (ref 3.87–5.11)
RDW: 13 % (ref 11.5–15.5)
WBC: 9 10*3/uL (ref 4.0–10.5)
nRBC: 0 % (ref 0.0–0.2)

## 2018-05-01 LAB — BASIC METABOLIC PANEL
Anion gap: 5 (ref 5–15)
BUN: 5 mg/dL — ABNORMAL LOW (ref 6–20)
CO2: 30 mmol/L (ref 22–32)
Calcium: 8.2 mg/dL — ABNORMAL LOW (ref 8.9–10.3)
Chloride: 104 mmol/L (ref 98–111)
Creatinine, Ser: 0.58 mg/dL (ref 0.44–1.00)
GFR calc Af Amer: >60 mL/min (ref >60)
GFR calc non Af Amer: >60 mL/min (ref >60)
Glucose, Bld: 78 mg/dL (ref 70–99)
Potassium: 3.8 mmol/L (ref 3.5–5.1)
Sodium: 139 mmol/L (ref 135–145)

## 2018-05-01 LAB — MAGNESIUM: Magnesium: 2 mg/dL (ref 1.7–2.4)

## 2018-05-01 MED ORDER — ENOXAPARIN SODIUM 40 MG/0.4ML ~~LOC~~ SOLN: 40.0000 mg | SUBCUTANEOUS | Status: DC

## 2018-05-01 MED ORDER — SODIUM CHLORIDE 0.9 % IV SOLN
1.0000 g | INTRAVENOUS | Status: DC
  Filled 2018-05-01: qty 10

## 2018-05-01 MED ORDER — AZITHROMYCIN 500 MG PO TABS: 500.0000 mg | ORAL_TABLET | Freq: Every day | ORAL | Status: DC

## 2018-05-01 MED ORDER — CEFDINIR 300 MG PO CAPS: 300.0000 mg | ORAL_CAPSULE | Freq: Two times a day (BID) | ORAL | 0 refills | Status: DC

## 2018-05-01 NOTE — Progress Notes (Signed)
PHARMACIST - PHYSICIAN COMMUNICATION  CONCERNING: Antibiotic IV to Oral Route Change Policy  RECOMMENDATION: This patient is receiving Azithromycin by the intravenous route.  Based on criteria approved by the Pharmacy and Therapeutics Committee, the antibiotic(s) is/are being converted to the equivalent oral dose form(s).   DESCRIPTION: These criteria include:  Patient being treated for a respiratory tract infection, urinary tract infection, cellulitis or clostridium difficile associated diarrhea if on metronidazole  The patient is not neutropenic and does not exhibit a GI malabsorption state  The patient is eating (either orally or via tube) and/or has been taking other orally administered medications for a least 24 hours  The patient is improving clinically and has a Tmax < 100.5  If you have questions about this conversion, please contact the Pharmacy Department  []   [phone] )  [facility]   MLS  1.4.2020 [phone]

## 2018-05-01 NOTE — Discharge Planning (Signed)
Patient IV and tele removed.  RN assessment and VS revealed stability for DC to home.  Discharge papers given, explained and educated.  Informed of suggested FU appt.  Anbx script printed and given. Ambulated in hall on RA and SPO2 maintained 90 or better.  Once ready, will be wheeled to front and patient will be transporting self home.  (Has received no Narcotics this AM.)

## 2018-05-01 NOTE — Discharge Summary (Signed)
[facility] [place] NAME: Jenny Hendrix    MR#:  [id number]  DATE OF BIRTH:  January 05, 1981  DATE OF ADMISSION:  04/27/2018 ADMITTING PHYSICIAN: Lance Coon, MD  DATE OF DISCHARGE: 05/01/2018 11:29 AM  PRIMARY CARE PHYSICIAN: Remi Haggard, FNP    ADMISSION DIAGNOSIS:  Community acquired pneumonia, unspecified laterality [J18.9] Sepsis, due to unspecified organism, unspecified whether acute organ dysfunction present ([place]) [A41.9]  DISCHARGE DIAGNOSIS:  Principal Problem:   Sepsis ([place]) Active Problems:   Anxiety and depression   GERD (gastroesophageal reflux disease)   Hypothyroidism   CAP (community acquired pneumonia)   SECONDARY DIAGNOSIS:   Past Medical History:  Diagnosis Date  . Anxiety   . ASCUS of cervix with negative high risk HPV 05/22/2016  . Depression   . HSV infection   . Hypothyroid   . Hypothyroidism   . Ovarian cyst     HOSPITAL COURSE:    38 year old female who presented with fever and chills and shortness of breath.  1.  Sepsis: Patient presented with fever, tachypnea and leukocytosis.  Sepsis is due to multifocal pneumonia.  Sepsis is resolved.  2.  Acute hypoxic respiratory failure in the setting of community-acquired pneumonia: Patient was on IV Rocephin and azithromycin.  She will be discharged on oral cefdinir. Oxygen was weaned off.  3.  Hypothyroidism: Continue Synthroid  4.  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 Recommend annual imaging followup by CTA or MRA. This recommendation follows 2010 ACCF/AHA/AATS/ACR/ASA/SCA/SCAI/SIR/STS/SVM Guidelines for the Diagnosis and Management of Patients with Thoracic Aortic Disease. 2010; 121: Y101-B510. DISCHARGE CONDITIONS AND DIET:   Stable Regular diet  CONSULTS OBTAINED:    DRUG ALLERGIES:   Allergies  Allergen Reactions  .  Benzonatate Diarrhea  . Metronidazole Other (See Comments)    Body aches, all side effects     DISCHARGE MEDICATIONS:   Allergies as of 05/01/2018      Reactions   Benzonatate Diarrhea   Metronidazole Other (See Comments)   Body aches, all side effects       Medication List    STOP taking these medications   HYDROcodone-acetaminophen 5-325 MG tablet Commonly known as:  NORCO/VICODIN     TAKE these medications   cefdinir 300 MG capsule Commonly known as:  OMNICEF Take 1 capsule (300 mg total) by mouth 2 (two) times daily for 4 days.   clonazePAM 1 MG tablet Commonly known as:  KLONOPIN Take 1 mg by mouth 3 (three) times daily as needed.   levonorgestrel 20 MCG/24HR IUD Commonly known as:  MIRENA 1 Intra Uterine Device (1 each total) by Intrauterine route once for 1 dose.   levothyroxine 50 MCG tablet Commonly known as:  SYNTHROID Take 1 tablet (50 mcg total) by mouth daily before breakfast.   omeprazole 40 MG capsule Commonly known as:  PRILOSEC Take 40 mg by mouth at bedtime.   sertraline 100 MG tablet Commonly known as:  ZOLOFT Take 1 tablet (100 mg total) by mouth daily. What changed:    how much to take  when to take this   Vitamin C 500 MG Chew Chew 500 mg by mouth daily.         Today   CHIEF COMPLAINT:  Doing well wants to go home today much better this am   VITAL SIGNS:  Blood pressure 117/76, pulse 72, temperature 97.8 F (36.6  C), temperature source Oral, resp. rate 20, height 5\' 3"  (1.6 m), weight 99.4 kg, SpO2 99 %, not currently breastfeeding.   REVIEW OF SYSTEMS:  Review of Systems  Constitutional: Negative.  Negative for chills, fever and malaise/fatigue.  HENT: Negative.  Negative for ear discharge, ear pain, hearing loss, nosebleeds and sore throat.   Eyes: Negative.  Negative for blurred vision and pain.  Respiratory: Negative.  Negative for cough, hemoptysis, shortness of breath and wheezing.   Cardiovascular: Negative.   Negative for chest pain, palpitations and leg swelling.  Gastrointestinal: Negative.  Negative for abdominal pain, blood in stool, diarrhea, nausea and vomiting.  Genitourinary: Negative.  Negative for dysuria.  Musculoskeletal: Negative.  Negative for back pain.  Skin: Negative.   Neurological: Negative for dizziness, tremors, speech change, focal weakness, seizures and headaches.  Endo/Heme/Allergies: Negative.  Does not bruise/bleed easily.  Psychiatric/Behavioral: Negative.  Negative for depression, hallucinations and suicidal ideas.     PHYSICAL EXAMINATION:  GENERAL:  38 y.o.-year-old patient lying in the bed with no acute distress.  NECK:  Supple, no jugular venous distention. No thyroid enlargement, no tenderness.  LUNGS: Normal breath sounds bilaterally, no wheezing, rales,rhonchi  No use of accessory muscles of respiration.  CARDIOVASCULAR: S1, S2 normal. No murmurs, rubs, or gallops.  ABDOMEN: Soft, non-tender, non-distended. Bowel sounds present. No organomegaly or mass.  EXTREMITIES: No pedal edema, cyanosis, or clubbing.  PSYCHIATRIC: The patient is alert and oriented x 3.  SKIN: No obvious rash, lesion, or ulcer.   DATA REVIEW:   CBC Recent Labs  Lab 05/01/18 0341  WBC 9.0  HGB 10.0*  HCT 32.9*  PLT 372    Chemistries  Recent Labs  Lab 04/27/18 2321  05/01/18 0341  NA 137   < > 139  K 3.1*   < > 3.8  CL 105   < > 104  CO2 24   < > 30  GLUCOSE 110*   < > 78  BUN 11   < > 5*  CREATININE 0.62   < > 0.58  CALCIUM 8.5*   < > 8.2*  MG  --    < > 2.0  AST 18  --   --   ALT 16  --   --   ALKPHOS 81  --   --   BILITOT 0.5  --   --    < > = values in this interval not displayed.    Cardiac Enzymes No results for input(s): TROPONINI in the last 168 hours.  Microbiology Results  @MICRORSLT48 @  RADIOLOGY:  Ct Angio Chest Pe W Or Wo Contrast  Result Date: 04/29/2018 CLINICAL DATA:  38 year old who presented 2 days ago with hypoxemia and shortness of  breath and is being treated for pneumonia. Persistent hypoxia when taken off of oxygen therapy. EXAM: CT ANGIOGRAPHY CHEST WITH CONTRAST TECHNIQUE: Multidetector CT imaging of the chest was performed using the standard protocol during bolus administration of intravenous contrast. Multiplanar CT image reconstructions and MIPs were obtained to evaluate the vascular anatomy. CONTRAST:  50mL OMNIPAQUE IOHEXOL 350 MG/ML IV. COMPARISON:  No prior CT. Chest x-ray 04/27/2018. FINDINGS: Cardiovascular: Contrast opacification of the pulmonary arteries is good. No filling defects within either main pulmonary artery or their segmental branches in either lung to suggest pulmonary embolism. Heart size upper normal. No visible coronary atherosclerosis. No pericardial effusion. Mild ectasia of the ascending thoracic aorta measuring up to 4.0 cm, though this measurement may not be accurate due to the significant  motion of the hyperdynamic heart. No visible atherosclerosis involving the thoracic or proximal abdominal aorta or their visible branches. Mediastinum/Nodes: Upper normal sized to mildly enlarged mediastinal and RIGHT hilar lymph nodes. Index RIGHT UPPER paratracheal (station 2R) mediastinal node measures approximately 2.1 x 1.9 cm. Index RIGHT hilar node measures approximately 1.7 x 2.1 cm. Normal-sized lymph nodes elsewhere in the mediastinum and LEFT hilum. No axillary lymphadenopathy. Normal appearing esophagus. Normal-appearing thyroid gland. Lungs/Pleura: Airspace opacities throughout both lungs, with upper lobe predominance. No associated pleural effusions. Central airways patent without significant bronchial wall thickening. Upper Abdomen: Possible hepatosplenomegaly, though the liver and spleen are incompletely imaged. Approximate 2.0 x 1.3 cm low-attenuation nodule involving the RIGHT adrenal gland with Hounsfield measurement of 0. Visualized upper abdomen otherwise unremarkable for the early arterial phase of  enhancement. Musculoskeletal: Mild thoracic spondylosis. No acute findings. Review of the MIP images confirms the above findings. IMPRESSION: 1. No evidence of pulmonary embolism. 2. Pneumonia involving both lungs, with upper lobe predominance. No associated pleural effusions. 3. Mild mediastinal and RIGHT hilar lymphadenopathy, likely reactive. 4. 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 Recommend annual imaging followup by CTA or MRA. This recommendation follows 2010 ACCF/AHA/AATS/ACR/ASA/SCA/SCAI/SIR/STS/SVM Guidelines for the Diagnosis and Management of Patients with Thoracic Aortic Disease. 2010; 121: X381-W299. 5. Possible hepatosplenomegaly, though the liver and spleen are incompletely imaged. 6. RIGHT adrenal adenoma. Electronically Signed   By: Evangeline Dakin M.D.   On: 04/29/2018 13:26      Allergies as of 05/01/2018      Reactions   Benzonatate Diarrhea   Metronidazole Other (See Comments)   Body aches, all side effects       Medication List    STOP taking these medications   HYDROcodone-acetaminophen 5-325 MG tablet Commonly known as:  NORCO/VICODIN     TAKE these medications   cefdinir 300 MG capsule Commonly known as:  OMNICEF Take 1 capsule (300 mg total) by mouth 2 (two) times daily for 4 days.   clonazePAM 1 MG tablet Commonly known as:  KLONOPIN Take 1 mg by mouth 3 (three) times daily as needed.   levonorgestrel 20 MCG/24HR IUD Commonly known as:  MIRENA 1 Intra Uterine Device (1 each total) by Intrauterine route once for 1 dose.   levothyroxine 50 MCG tablet Commonly known as:  SYNTHROID Take 1 tablet (50 mcg total) by mouth daily before breakfast.   omeprazole 40 MG capsule Commonly known as:  PRILOSEC Take 40 mg by mouth at bedtime.   sertraline 100 MG tablet Commonly known as:  ZOLOFT Take 1 tablet (100 mg total) by mouth daily. What changed:    how much to take  when to take this   Vitamin C 500 MG  Chew Chew 500 mg by mouth daily.           Management plans discussed with the patient and she is in agreement. Stable for discharge home  Patient should follow up with pcp  CODE STATUS:     Code Status Orders  (From admission, onward)         Start     Ordered   04/28/18 0422  Full code  Continuous     04/28/18 0421        Code Status History    Date Active Date Inactive Code Status Order ID Comments User Context   09/05/2017 0936 09/06/2017 2223 Full Code [id number]  Rexene Agent, CNM Inpatient  TOTAL TIME TAKING CARE OF THIS PATIENT: 38 minutes.    Note: This dictation was prepared with Dragon dictation along with smaller phrase technology. Any transcriptional errors that result from this process are unintentional.  Vernis Eid M.D on 05/01/2018 at 11:42 AM  Between 7am to 6pm - Pager - [phone] After 6pm go to [email or web address] - password [email or web address]  Office  [phone]  CC: Primary care physician; Remi Haggard, FNP

## 2018-05-02 LAB — CULTURE, BLOOD (ROUTINE X 2)
Culture: NO GROWTH
Culture: NO GROWTH

## 2018-05-04 ENCOUNTER — Inpatient Hospital Stay (HOSPITAL_COMMUNITY)
Admission: EM | Admit: 2018-05-04 | Discharge: 2018-05-11 | DRG: 193 | Disposition: A | Discharge status: Home / Self Care | Payer: 59 | Attending: Internal Medicine | Admitting: Internal Medicine

## 2018-05-04 ENCOUNTER — Emergency Department (HOSPITAL_COMMUNITY): Payer: 59

## 2018-05-04 ENCOUNTER — Telehealth: Payer: Self-pay

## 2018-05-04 ENCOUNTER — Encounter (HOSPITAL_COMMUNITY): Payer: Self-pay | Admitting: Emergency Medicine

## 2018-05-04 DIAGNOSIS — R9389 Abnormal findings on diagnostic imaging of other specified body structures: Secondary | ICD-10-CM | POA: Diagnosis not present

## 2018-05-04 DIAGNOSIS — Z91040 Latex allergy status: Secondary | ICD-10-CM

## 2018-05-04 DIAGNOSIS — Z8701 Personal history of pneumonia (recurrent): Secondary | ICD-10-CM

## 2018-05-04 DIAGNOSIS — R197 Diarrhea, unspecified: Secondary | ICD-10-CM | POA: Diagnosis not present

## 2018-05-04 DIAGNOSIS — J9601 Acute respiratory failure with hypoxia: Secondary | ICD-10-CM | POA: Diagnosis not present

## 2018-05-04 DIAGNOSIS — F419 Anxiety disorder, unspecified: Secondary | ICD-10-CM | POA: Diagnosis present

## 2018-05-04 DIAGNOSIS — R918 Other nonspecific abnormal finding of lung field: Secondary | ICD-10-CM | POA: Diagnosis not present

## 2018-05-04 DIAGNOSIS — J984 Other disorders of lung: Secondary | ICD-10-CM

## 2018-05-04 DIAGNOSIS — Z803 Family history of malignant neoplasm of breast: Secondary | ICD-10-CM | POA: Diagnosis not present

## 2018-05-04 DIAGNOSIS — Z79899 Other long term (current) drug therapy: Secondary | ICD-10-CM | POA: Diagnosis not present

## 2018-05-04 DIAGNOSIS — R0689 Other abnormalities of breathing: Secondary | ICD-10-CM

## 2018-05-04 DIAGNOSIS — J9600 Acute respiratory failure, unspecified whether with hypoxia or hypercapnia: Secondary | ICD-10-CM | POA: Diagnosis present

## 2018-05-04 DIAGNOSIS — D473 Essential (hemorrhagic) thrombocythemia: Secondary | ICD-10-CM | POA: Diagnosis not present

## 2018-05-04 DIAGNOSIS — D649 Anemia, unspecified: Secondary | ICD-10-CM | POA: Diagnosis present

## 2018-05-04 DIAGNOSIS — K08409 Partial loss of teeth, unspecified cause, unspecified class: Secondary | ICD-10-CM | POA: Diagnosis present

## 2018-05-04 DIAGNOSIS — F32A Depression, unspecified: Secondary | ICD-10-CM | POA: Diagnosis present

## 2018-05-04 DIAGNOSIS — Z79890 Hormone replacement therapy (postmenopausal): Secondary | ICD-10-CM | POA: Diagnosis not present

## 2018-05-04 DIAGNOSIS — I370 Nonrheumatic pulmonary valve stenosis: Secondary | ICD-10-CM | POA: Diagnosis not present

## 2018-05-04 DIAGNOSIS — K219 Gastro-esophageal reflux disease without esophagitis: Secondary | ICD-10-CM | POA: Diagnosis present

## 2018-05-04 DIAGNOSIS — Z9119 Patient's noncompliance with other medical treatment and regimen: Secondary | ICD-10-CM | POA: Diagnosis not present

## 2018-05-04 DIAGNOSIS — J811 Chronic pulmonary edema: Secondary | ICD-10-CM | POA: Diagnosis present

## 2018-05-04 DIAGNOSIS — I361 Nonrheumatic tricuspid (valve) insufficiency: Secondary | ICD-10-CM | POA: Diagnosis not present

## 2018-05-04 DIAGNOSIS — F329 Major depressive disorder, single episode, unspecified: Secondary | ICD-10-CM | POA: Diagnosis present

## 2018-05-04 DIAGNOSIS — R112 Nausea with vomiting, unspecified: Secondary | ICD-10-CM

## 2018-05-04 DIAGNOSIS — Y95 Nosocomial condition: Secondary | ICD-10-CM | POA: Diagnosis present

## 2018-05-04 DIAGNOSIS — Z8349 Family history of other endocrine, nutritional and metabolic diseases: Secondary | ICD-10-CM

## 2018-05-04 DIAGNOSIS — Z91048 Other nonmedicinal substance allergy status: Secondary | ICD-10-CM

## 2018-05-04 DIAGNOSIS — Z8619 Personal history of other infectious and parasitic diseases: Secondary | ICD-10-CM | POA: Diagnosis not present

## 2018-05-04 DIAGNOSIS — E039 Hypothyroidism, unspecified: Secondary | ICD-10-CM | POA: Diagnosis present

## 2018-05-04 DIAGNOSIS — Z7729 Contact with and (suspected ) exposure to other hazardous substances: Secondary | ICD-10-CM | POA: Diagnosis present

## 2018-05-04 DIAGNOSIS — J189 Pneumonia, unspecified organism: Secondary | ICD-10-CM

## 2018-05-04 DIAGNOSIS — Z888 Allergy status to other drugs, medicaments and biological substances status: Secondary | ICD-10-CM

## 2018-05-04 DIAGNOSIS — D75839 Thrombocytosis, unspecified: Secondary | ICD-10-CM

## 2018-05-04 HISTORY — DX: Pneumonia, unspecified organism: J18.9

## 2018-05-04 HISTORY — DX: Anemia, unspecified: D64.9

## 2018-05-04 HISTORY — DX: Hypoxemia: R09.02

## 2018-05-04 LAB — INFLUENZA PANEL BY PCR (TYPE A & B)
Influenza A By PCR: NEGATIVE
Influenza B By PCR: NEGATIVE

## 2018-05-04 LAB — I-STAT ARTERIAL BLOOD GAS, ED
Bicarbonate: 24.9 mmol/L (ref 20.0–28.0)
O2 Saturation: 95 %
Patient temperature: 98
TCO2: 26 mmol/L (ref 22–32)
pCO2 arterial: 38 mmHg (ref 32.0–48.0)
pH, Arterial: 7.423 (ref 7.350–7.450)
pO2, Arterial: 73 mmHg — ABNORMAL LOW (ref 83.0–108.0)

## 2018-05-04 LAB — CBC WITH DIFFERENTIAL/PLATELET
Abs Immature Granulocytes: 0.25 10*3/uL — ABNORMAL HIGH (ref 0.00–0.07)
Basophils Absolute: 0.1 10*3/uL (ref 0.0–0.1)
Basophils Relative: 0 %
Eosinophils Absolute: 0.4 10*3/uL (ref 0.0–0.5)
Eosinophils Relative: 2 %
HCT: 31.7 % — ABNORMAL LOW (ref 36.0–46.0)
Hemoglobin: 9.7 g/dL — ABNORMAL LOW (ref 12.0–15.0)
Immature Granulocytes: 2 %
Lymphocytes Relative: 7 %
Lymphs Abs: 1.2 10*3/uL (ref 0.7–4.0)
MCH: 29.1 pg (ref 26.0–34.0)
MCHC: 30.6 g/dL (ref 30.0–36.0)
MCV: 95.2 fL (ref 80.0–100.0)
Monocytes Absolute: 0.6 10*3/uL (ref 0.1–1.0)
Monocytes Relative: 4 %
Neutro Abs: 14 10*3/uL — ABNORMAL HIGH (ref 1.7–7.7)
Neutrophils Relative %: 85 %
Platelets: 516 10*3/uL — ABNORMAL HIGH (ref 150–400)
RBC: 3.33 MIL/uL — ABNORMAL LOW (ref 3.87–5.11)
RDW: 13.9 % (ref 11.5–15.5)
WBC: 16.4 10*3/uL — ABNORMAL HIGH (ref 4.0–10.5)
nRBC: 0.7 % — ABNORMAL HIGH (ref 0.0–0.2)

## 2018-05-04 LAB — BASIC METABOLIC PANEL
Anion gap: 9 (ref 5–15)
BUN: 8 mg/dL (ref 6–20)
CO2: 25 mmol/L (ref 22–32)
Calcium: 8.2 mg/dL — ABNORMAL LOW (ref 8.9–10.3)
Chloride: 101 mmol/L (ref 98–111)
Creatinine, Ser: 0.85 mg/dL (ref 0.44–1.00)
GFR calc Af Amer: >60 mL/min (ref >60)
GFR calc non Af Amer: >60 mL/min (ref >60)
Glucose, Bld: 106 mg/dL — ABNORMAL HIGH (ref 70–99)
Potassium: 3.6 mmol/L (ref 3.5–5.1)
SODIUM: 135 mmol/L (ref 135–145)

## 2018-05-04 LAB — I-STAT BETA HCG BLOOD, ED (MC, WL, AP ONLY): I-stat hCG, quantitative: <5 m[IU]/mL (ref <5)

## 2018-05-04 MED ORDER — PROMETHAZINE HCL 25 MG/ML IJ SOLN
25.0000 mg | Freq: Four times a day (QID) | INTRAMUSCULAR | Status: DC | PRN
  Administered 2018-05-05 – 2018-05-11 (×12): 25 mg via INTRAVENOUS
  Filled 2018-05-04 (×12): qty 1

## 2018-05-04 MED ORDER — SODIUM CHLORIDE 0.9 % IV SOLN
2.0000 g | Freq: Once | INTRAVENOUS | Status: AC
  Administered 2018-05-05: 2 g via INTRAVENOUS
  Filled 2018-05-04: qty 2

## 2018-05-04 MED ORDER — LEVOTHYROXINE SODIUM 50 MCG PO TABS
50.0000 ug | ORAL_TABLET | Freq: Every day | ORAL | Status: DC
  Administered 2018-05-05 – 2018-05-11 (×7): 50 ug via ORAL
  Filled 2018-05-04 (×7): qty 1

## 2018-05-04 MED ORDER — SERTRALINE HCL 50 MG PO TABS
50.0000 mg | ORAL_TABLET | Freq: Every day | ORAL | Status: DC
  Administered 2018-05-05 – 2018-05-11 (×7): 50 mg via ORAL
  Filled 2018-05-04 (×8): qty 1

## 2018-05-04 MED ORDER — ONDANSETRON HCL 4 MG/2ML IJ SOLN
4.0000 mg | Freq: Once | INTRAMUSCULAR | Status: AC
  Administered 2018-05-04: 4 mg via INTRAVENOUS
  Filled 2018-05-04: qty 2

## 2018-05-04 MED ORDER — SODIUM CHLORIDE 0.9 % IV SOLN
500.0000 mg | Freq: Once | INTRAVENOUS | Status: AC
  Administered 2018-05-04: 500 mg via INTRAVENOUS
  Filled 2018-05-04: qty 500

## 2018-05-04 MED ORDER — VANCOMYCIN HCL 10 G IV SOLR
1250.0000 mg | Freq: Two times a day (BID) | INTRAVENOUS | Status: DC
  Administered 2018-05-05 (×2): 1250 mg via INTRAVENOUS
  Filled 2018-05-04 (×3): qty 1250

## 2018-05-04 MED ORDER — PANTOPRAZOLE SODIUM 40 MG PO TBEC
40.0000 mg | DELAYED_RELEASE_TABLET | Freq: Every day | ORAL | Status: DC
  Administered 2018-05-05 – 2018-05-10 (×6): 40 mg via ORAL
  Filled 2018-05-04 (×7): qty 1

## 2018-05-04 MED ORDER — PROCHLORPERAZINE EDISYLATE 10 MG/2ML IJ SOLN
10.0000 mg | Freq: Once | INTRAMUSCULAR | Status: AC
  Administered 2018-05-04: 10 mg via INTRAVENOUS
  Filled 2018-05-04: qty 2

## 2018-05-04 MED ORDER — IOPAMIDOL (ISOVUE-370) INJECTION 76%
INTRAVENOUS | Status: AC
  Filled 2018-05-04: qty 100

## 2018-05-04 MED ORDER — IOPAMIDOL (ISOVUE-370) INJECTION 76%
100.0000 mL | Freq: Once | INTRAVENOUS | Status: AC | PRN
  Administered 2018-05-04: 65 mL via INTRAVENOUS

## 2018-05-04 MED ORDER — VANCOMYCIN HCL 10 G IV SOLR
2000.0000 mg | Freq: Once | INTRAVENOUS | Status: AC
  Administered 2018-05-05: 2000 mg via INTRAVENOUS
  Filled 2018-05-04: qty 2000

## 2018-05-04 MED ORDER — ENOXAPARIN SODIUM 40 MG/0.4ML ~~LOC~~ SOLN
40.0000 mg | SUBCUTANEOUS | Status: DC
  Administered 2018-05-05 – 2018-05-11 (×7): 40 mg via SUBCUTANEOUS
  Filled 2018-05-04 (×8): qty 0.4

## 2018-05-04 MED ORDER — DM-GUAIFENESIN ER 30-600 MG PO TB12
1.0000 | ORAL_TABLET | Freq: Two times a day (BID) | ORAL | Status: DC
  Administered 2018-05-05 (×3): 1 via ORAL
  Filled 2018-05-04 (×3): qty 1

## 2018-05-04 MED ORDER — CLONAZEPAM 1 MG PO TABS
1.0000 mg | ORAL_TABLET | Freq: Three times a day (TID) | ORAL | Status: DC | PRN
  Administered 2018-05-05 – 2018-05-11 (×9): 1 mg via ORAL
  Filled 2018-05-04: qty 1
  Filled 2018-05-04: qty 2
  Filled 2018-05-04 (×8): qty 1

## 2018-05-04 MED ORDER — SODIUM CHLORIDE 0.9 % IV SOLN
INTRAVENOUS | Status: AC
  Administered 2018-05-05 (×2): via INTRAVENOUS

## 2018-05-04 MED ORDER — METHYLPREDNISOLONE SODIUM SUCC 125 MG IJ SOLR: 60.0000 mg | Freq: Four times a day (QID) | INTRAMUSCULAR | Status: DC

## 2018-05-04 MED ORDER — SODIUM CHLORIDE 0.9 % IV SOLN
1.0000 g | Freq: Once | INTRAVENOUS | Status: AC
  Administered 2018-05-04: 1 g via INTRAVENOUS
  Filled 2018-05-04: qty 10

## 2018-05-04 NOTE — H&P (Signed)
History and Physical    Jenny Hendrix YDX:[id number] DOB: 03/30/1981 DOA: 05/04/2018  PCP: Remi Haggard, FNP Patient coming from: PCPs office  Chief Complaint: Shortness of breath  HPI: Jenny Hendrix is a 38 y.o. female with medical history significant of anxiety, depression, hypothyroidism presenting to the hospital for evaluation of shortness of breath.  Oxygen saturation 65% on room air in triage.  Patient was recently admitted at [facility] from 12/31 to 1/4 for acute hypoxic respiratory failure and sepsis secondary to multifocal pneumonia.  She was treated with ceftriaxone and azithromycin in the hospital and discharged home on cefdinir.  Oxygen was weaned off.  Patient went to her primary care office today and was found to be hypoxic and sent here for evaluation.  Patient states she took 3 doses of cefdinir since her hospital discharge but then lost the medication.  She started having dyspnea again last night which has been getting progressively worse.  Having a nonproductive cough.  Having chest pain only when coughing; no chest pain at present.  She has been having fevers, chills, decreased p.o. intake, and fatigue.  She has been feeling nauseous and vomited today.  Denies having any abdominal pain.  Reports secondhand exposure to vaping (her husband vapes).  Denies smoking cigarettes or smoking any drugs.  Denies history of asthma or any other pulmonary disease.  Review of Systems: As per HPI otherwise 10 point review of systems negative.  Past Medical History:  Diagnosis Date  . Anxiety   . ASCUS of cervix with negative high risk HPV 05/22/2016  . Depression   . HSV infection   . Hypothyroid   . Hypothyroidism   . Ovarian cyst     Past Surgical History:  Procedure Laterality Date  . CESAREAN SECTION N/A 09/06/2017   Procedure: CESAREAN SECTION;  Surgeon: Malachy Mood, MD;  Location: [facility] ORS;  Service: Obstetrics;  Laterality: N/A;  . DILATION AND  EVACUATION N/A 06/19/2016   Procedure: DILATATION AND EVACUATION;  Surgeon: Gae Dry, MD;  Location: [facility] ORS;  Service: Gynecology;  Laterality: N/A;  . TONSILLECTOMY AND ADENOIDECTOMY    . WISDOM TOOTH EXTRACTION       reports that she has never smoked. She has never used smokeless tobacco. She reports that she does not drink alcohol or use drugs.  Allergies  Allergen Reactions  . Azithromycin Nausea And Vomiting    Vomits if received via IV  . Benzonatate Diarrhea  . Metronidazole Other (See Comments)    Body aches, all-over side effects   . Latex Rash    NO POWDERED GLOVES!!  . Tape Rash    No Band-Aids!!    Family History  Problem Relation Age of Onset  . Diabetes Mother   . Hyperlipidemia Maternal Grandmother   . Hypertension Maternal Grandmother   . Breast cancer Maternal Grandmother   . Thyroid disease Maternal Grandmother     Prior to Admission medications   Medication Sig Start Date End Date Taking? Authorizing Provider  albuterol (PROAIR HFA) 108 (90 Base) MCG/ACT inhaler Inhale 1-2 puffs into the lungs every 6 (six) hours as needed for wheezing or shortness of breath.   Yes [provider]  clonazePAM (KLONOPIN) 1 MG tablet Take 1 mg by mouth 3 (three) times daily as needed for anxiety.  02/02/18  Yes [provider]  levonorgestrel (MIRENA) 20 MCG/24HR IUD 1 Intra Uterine Device (1 each total) by Intrauterine route once for 1 dose. 03/01/18 05/04/18 Yes Copland,  Deirdre Evener, PA-C  levothyroxine (SYNTHROID, LEVOTHROID) 50 MCG tablet Take 1 tablet (50 mcg total) by mouth daily before breakfast. 11/07/17  Yes Malachy Mood, MD  omeprazole (PRILOSEC) 40 MG capsule Take 40 mg by mouth at bedtime.    Yes [provider]  sertraline (ZOLOFT) 100 MG tablet Take 1 tablet (100 mg total) by mouth daily. Patient taking differently: Take 50 mg by mouth daily with lunch.  11/06/17  Yes Malachy Mood, MD  cefdinir (OMNICEF) 300 MG capsule Take 1  capsule (300 mg total) by mouth 2 (two) times daily for 4 days. Patient not taking: Reported on 05/04/2018 05/01/18 05/05/18  Bettey Costa, MD    Physical Exam: Vitals:   05/04/18 1945 05/04/18 2021 05/04/18 2059 05/04/18 2343  BP:  114/71  117/67  Pulse: 87 86 85 83  Resp: (!) 32 (!) 26 (!) 30 (!) 25  Temp:      SpO2: 100% 93% 96% 96%    Physical Exam  Constitutional: She is oriented to person, place, and time. She appears well-developed and well-nourished. No distress.  HENT:  Head: Normocephalic.  Mouth/Throat: Oropharynx is clear and moist.  Eyes: Right eye exhibits no discharge. Left eye exhibits no discharge.  Neck: Neck supple.  Cardiovascular: Normal rate, regular rhythm and intact distal pulses.  Pulmonary/Chest:  On 12 L oxygen via high flow nasal cannula Speaking clearly in full sentences Diffuse crackles appreciated on auscultation.  No wheezing.  Abdominal: Soft. Bowel sounds are normal. She exhibits no distension. There is no abdominal tenderness. There is no guarding.  Musculoskeletal:        General: No edema.  Neurological: She is alert and oriented to person, place, and time.  Skin: Skin is warm and dry. She is not diaphoretic.  Psychiatric: She has a normal mood and affect. Her behavior is normal.     Labs on Admission: I have personally reviewed following labs and imaging studies  CBC: Recent Labs  Lab 04/28/18 0559 04/29/18 0457 04/30/18 0800 05/01/18 0341 05/04/18 1751  WBC 12.2* 11.2* 11.1* 9.0 16.4*  NEUTROABS  --   --   --   --  14.0*  HGB 10.6* 10.1* 9.8* 10.0* 9.7*  HCT 33.9* 33.4* 32.5* 32.9* 31.7*  MCV 93.6 95.4 95.9 95.6 95.2  PLT 331 335 340 372 099*   Basic Metabolic Panel: Recent Labs  Lab 04/28/18 0559 04/29/18 0457 04/30/18 0800 05/01/18 0341 05/04/18 1751  NA 137 138 139 139 135  K 3.2* 3.5 4.1 3.8 3.6  CL 105 106 105 104 101  CO2 24 26 27 30 25   GLUCOSE 97 90 92 78 106*  BUN 8 7 5* 5* 8  CREATININE 0.56 0.52 0.57 0.58  0.85  CALCIUM 7.9* 8.1* 8.2* 8.2* 8.2*  MG  --  2.1 1.9 2.0  --   PHOS  --   --  3.9  --   --    GFR: Estimated Creatinine Clearance: 103.8 mL/min (by C-G formula based on SCr of 0.85 mg/dL). Liver Function Tests: No results for input(s): AST, ALT, ALKPHOS, BILITOT, PROT, ALBUMIN in the last 168 hours. No results for input(s): LIPASE, AMYLASE in the last 168 hours. No results for input(s): AMMONIA in the last 168 hours. Coagulation Profile: No results for input(s): INR, PROTIME in the last 168 hours. Cardiac Enzymes: No results for input(s): CKTOTAL, CKMB, CKMBINDEX, TROPONINI in the last 168 hours. BNP (last 3 results) No results for input(s): PROBNP in the last 8760 hours. HbA1C:  No results for input(s): HGBA1C in the last 72 hours. CBG: No results for input(s): GLUCAP in the last 168 hours. Lipid Profile: No results for input(s): CHOL, HDL, LDLCALC, TRIG, CHOLHDL, LDLDIRECT in the last 72 hours. Thyroid Function Tests: No results for input(s): TSH, T4TOTAL, FREET4, T3FREE, THYROIDAB in the last 72 hours. Anemia Panel: No results for input(s): VITAMINB12, FOLATE, FERRITIN, TIBC, IRON, RETICCTPCT in the last 72 hours. Urine analysis:    Component Value Date/Time   COLORURINE YELLOW (A) 04/27/2018 2323   APPEARANCEUR CLEAR (A) 04/27/2018 2323   APPEARANCEUR Clear 05/09/2012 0144   LABSPEC 1.031 (H) 04/27/2018 2323   LABSPEC 1.025 05/09/2012 0144   PHURINE 6.0 04/27/2018 2323   GLUCOSEU NEGATIVE 04/27/2018 2323   GLUCOSEU Negative 05/09/2012 0144   HGBUR SMALL (A) 04/27/2018 2323   BILIRUBINUR NEGATIVE 04/27/2018 2323   BILIRUBINUR Negative 05/09/2012 0144   KETONESUR NEGATIVE 04/27/2018 2323   PROTEINUR 100 (A) 04/27/2018 2323   NITRITE NEGATIVE 04/27/2018 2323   LEUKOCYTESUR NEGATIVE 04/27/2018 2323   LEUKOCYTESUR Negative 05/09/2012 0144    Radiological Exams on Admission: Ct Angio Chest Pe W And/or Wo Contrast  Result Date: 05/04/2018 CLINICAL DATA:  Chest pain,  complex, intermediate/high prob of ACS/PE/AAS. Cough and shortness of breath. EXAM: CT ANGIOGRAPHY CHEST WITH CONTRAST TECHNIQUE: Multidetector CT imaging of the chest was performed using the standard protocol during bolus administration of intravenous contrast. Multiplanar CT image reconstructions and MIPs were obtained to evaluate the vascular anatomy. CONTRAST:  62mL ISOVUE-370 IOPAMIDOL (ISOVUE-370) INJECTION 76% COMPARISON:  Chest radiograph earlier this day. Chest CT 5 days prior [facility]/29/2018 at [facility]. FINDINGS: Cardiovascular: There are no filling defects within the pulmonary arteries to suggest pulmonary embolus. Ascending aorta measures 3.5 cm on the current exam, cardiac motion artifact limits more detailed assessment. No aortic dissection. Heart is normal in size. No pericardial effusion. Mediastinum/Nodes: Mild mediastinal and right greater than left hilar adenopathy, index node in the right paratracheal region measures 1.5 cm short axis. Findings are similar to recent prior. Esophagus is decompressed. No visualized thyroid nodule. Lungs/Pleura: Progression in diffuse ground-glass opacities throughout both lungs, with slight basilar sparing. Developing air bronchograms in the upper lobe centrally. No definite septal thickening. No pleural fluid. No pneumothorax. Trachea and mainstem bronchi are patent. Upper Abdomen: Suggestion of hepatosplenomegaly, partially included. No acute findings. Musculoskeletal: There are no acute or suspicious osseous abnormalities. Review of the MIP images confirms the above findings. IMPRESSION: 1. No pulmonary embolus. 2. Progression of diffuse ground-glass opacities throughout both lungs since CT 5 days ago, with slight basilar sparing. Developing air bronchograms in the upper lobes. Differential considerations are broad and findings may be secondary to atypical infection, acute interstitial pneumonia, ARDS, or pulmonary hemorrhage. Atypical pulmonary  edema is also considered. 3. Mild mediastinal and hilar adenopathy is similar to recent prior exam, likely reactive. 4. Ectatic ascending thoracic aorta on prior exam is not as well demonstrated on the current exam, motion artifact obscured. Electronically Signed   By: Keith Rake M.D.   On: 05/04/2018 20:08   Dg Chest Portable 1 View  Result Date: 05/04/2018 CLINICAL DATA:  Cough and shortness of breath for 2 weeks. Recent pneumonia. EXAM: PORTABLE CHEST 1 VIEW COMPARISON:  06/25/2004 FINDINGS: Heart size is within normal limits. Mild diffuse heterogeneous airspace disease is seen, which may be due to edema, atypical infection, or less likely hemorrhage. No evidence of pneumothorax or pleural effusion. IMPRESSION: Mild diffuse heterogeneous bilateral airspace disease. Differential diagnosis includes edema,  atypical infection, or less likely hemorrhage. Electronically Signed   By: Earle Gell M.D.   On: 05/04/2018 18:04    Assessment/Plan Principal Problem:   Acute respiratory failure with hypoxia (HCC) Active Problems:   Anxiety and depression   GERD (gastroesophageal reflux disease)   Hypothyroidism   Multifocal pneumonia   Nausea & vomiting   Chronic anemia   Thrombocytosis (HCC)   Acute hypoxic respiratory failure secondary to multifocal pneumonia -Oxygen saturation 65% on room air and tachypneic.  She was placed on nonrebreather in the ED.  ABG showing pH 7.42, PCO2 38, PO2 73.  Currently on 12 L oxygen via high flow nasal cannula. -White count 16.4.  Patient is afebrile.  Influenza panel negative.  CTA negative for PE. Showing progression of diffuse groundglass opacities throughout both lungs since CT done 5 days ago with slight basilar sparing.  Also showing developing air bronchograms in the upper lobes.  Findings possibly due to atypical infection or acute interstitial pneumonia.  However, there is also concern for viral infection, vaping induced lung injury (secondhand exposure),  and postinfectious [place].   HIV antibody negative during recent hospitalization.  -I discussed the case with Dr. Tamala Julian from the PCCM.  He reviewed the CT scan from today and patient's recent admission and does agree that it now looks worse.  PCCM team will see the patient. -Patient was previously treated with ceftriaxone and azithromycin at outside hospital.  Received additional doses in the ED today.  Will broaden coverage to vancomycin and cefepime.  -Solu-Medrol 60 mg every 6 hours to treat possible vaping induced lung injury -Sputum culture and Gram stain -Strep pneumo urine antigen -Mucinex-DM for cough -Respiratory viral panel -Continue to monitor CBC -Supplemental oxygen  Nausea and vomiting Likely related to acute illness. Patient is not complaining of abdominal pain and abdominal exam benign. -IV fluid hydration -IV Phenergan PRN -Check lipase and LFTs  Chronic anemia -Stable.  Hemoglobin 9.7, baseline in the 9-10 range.  Thrombocytosis Platelet count 516.  Patient does not smoke cigarettes. -Continue to monitor CBC  Anxiety and depression -Continue home Klonopin as needed -Continue home Zoloft  Hypothyroidism -Continue home Synthroid  GERD -Continue PPI  DVT prophylaxis: Lovenox Code Status: Full code Family Communication: No family available. Disposition Plan: Anticipate discharge after clinical improvement. Consults called: PCCM Admission status: It is my clinical opinion that admission to INPATIENT is reasonable and necessary in this 38 y.o. female . presenting with symptoms of shortness of breath, cough, fevers, chills, fatigue, concerning for acute hypoxic respiratory failure secondary to multifocal pneumonia . in the context of PMH including: Recent diagnosis of multifocal pneumonia . with pertinent positives on physical exam including: Supplemental oxygen requirement . and pertinent positives on radiographic and laboratory data including: ABG with evidence  of hypoxia.  CT with evidence of multifocal pneumonia.  Labs showing leukocytosis. . Workup and treatment include IV antibiotics, IV steroid, IV fluid hydration.  Patient has a high supplemental oxygen requirement.  Given the aforementioned, the predictability of an adverse outcome is felt to be significant. I expect that the patient will require at least 2 midnights in the hospital to treat this condition.    Shela Leff MD [facility] Pager [phone]  If 7PM-7AM, please contact night-coverage [email or web address] Password [facility]  05/04/2018, 11:52 PM

## 2018-05-04 NOTE — ED Triage Notes (Signed)
Pt here from home with c/o sob , sats reported to be 65 % on room air in triage, pt being treated for PNA

## 2018-05-04 NOTE — ED Notes (Signed)
Patient transported to CT 

## 2018-05-04 NOTE — Telephone Encounter (Signed)
EMMI Follow-up: Noted on the report that the patient had some other questions and no follow-up appointment.  No follow-up appointment noted on the After Visit Summary.  I left a message with my contact information for Ms. Fukuhara to call me at her convenience.

## 2018-05-04 NOTE — Progress Notes (Signed)
Pharmacy Antibiotic Note  Jenny Hendrix is a 38 y.o. female admitted on 05/04/2018 with pneumonia.  Pharmacy has been consulted for vancomycin and cefepime dosing.  Plan: Cefepime 2gm IV q8 hours Vancomycin 2gm IV x 1 then 1250 mg IV q12 hours F/u renal function, cultures and clinical course    Temp (24hrs), Avg:98.9 F (37.2 C), Min:98.9 F (37.2 C), Max:98.9 F (37.2 C)  Recent Labs  Lab 04/28/18 0559 04/29/18 0457 04/30/18 0800 05/01/18 0341 05/04/18 1751  WBC 12.2* 11.2* 11.1* 9.0 16.4*  CREATININE 0.56 0.52 0.57 0.58 0.85    Estimated Creatinine Clearance: 103.8 mL/min (by C-G formula based on SCr of 0.85 mg/dL).    Allergies  Allergen Reactions  . Azithromycin Nausea And Vomiting    Vomits if received via IV  . Benzonatate Diarrhea  . Metronidazole Other (See Comments)    Body aches, all-over side effects   . Latex Rash    NO POWDERED GLOVES!!  . Tape Rash    No Band-Aids!!    Thank you for allowing pharmacy to be a part of this patient's care.  Excell Seltzer Poteet 05/04/2018 11:48 PM

## 2018-05-04 NOTE — Consult Note (Signed)
NAME:  Jenny Hendrix, MRN:  [id number], DOB:  03/04/1981, LOS: 0 ADMISSION DATE:  05/04/2018, CONSULTATION DATE:  05/04/18 REFERRING MD:  Dr Marlowe Sax TRH, CHIEF COMPLAINT:  Pulmonary infiltrates.   Brief History   38 year old female admitted with marked hypoxia and bilateral pulmonary infiltrates.   History of present illness   38 year old female with PMH as below, which is significant for hypothyroid and anxiety. She was recently admitted to [facility] for multifocal pneumonia 12/31 through 1/4.Marland Kitchen She was started on antibiotics (ceftriaxone and azithroycin) and supplemental O2. Her symptoms improved, she was weaned off O2, and was discharged to home on oral cefdinir. Unfortunately, she lost her antibiotics and did not complete a full course. She still felt ill and presented to her PCP 1/7, where she was found to be hypoxic with O2 sat 80% on room air. She was referred to the emergency department. O2 sats were reportedly 65% and did improve with supplemental O2, but she did require escalation to non-rebreather mask.  CXR consistent with bilateral infiltrates. CT represents diffuse pulmonary infiltrates with relative sparing of the bases. PCCM is asked to see in consultation.  The patient denies current fever/chills. She has cough that is intermittently productive of clear sputum and occasionally has blood streaks. 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 She has had no contacts with birds. She does not smoke or vape. Her husband does, but he does not smoke or vape THC/CBD products. The only family history of autoimmune disease is lupus in her mother.   Past Medical History   has a past medical history of Anxiety, ASCUS of cervix with negative high risk HPV (05/22/2016), Depression, HSV infection, Hypothyroid, Hypothyroidism, and Ovarian cyst.  Significant Hospital Events   12/31 > 1/4 admit to [facility] for pneumonia.   Consults:  Pulmonary 1/7  >  Procedures:    Significant Diagnostic Tests:  CTA chest 1/2 > Pneumonia involving both lungs, with upper lobe predominance. No associated pleural effusions. Mild mediastinal and RIGHT hilar lymphadenopathy, likely Reactive. RIGHT adrenal adenoma CTA chest 1/7 > Progression of diffuse ground-glass opacities throughout both lungs since CT 5 days ago, with slight basilar sparing. Developing air bronchograms in the upper lobes. Mild mediastinal and hilar adenopathy is similar to recent prior exam, likely reactive.  Micro Data:  Sputum 1/7 > Flu > neg RVP 1/7 >  Antimicrobials:  12/31 > 1/4 Ceftriaxone/azithro 1/4 Cefdinir  Cefepime 1/7 > Vancomycin 1/7 >  Interim history/subjective:    Objective   Blood pressure 114/71, pulse 85, temperature 98.9 F (37.2 C), resp. rate (!) 30, SpO2 96 %, not currently breastfeeding.    FiO2 (%):  [50 %-100 %] 100 %   Intake/Output Summary (Last 24 hours) at 05/04/2018 2343 Last data filed at 05/04/2018 1853 Gross per 24 hour  Intake 100 ml  Output -  Net 100 ml   There were no vitals filed for this visit.  Examination: General: morbidly obese young adult female  HENT: [place]/AT, PERRl, no JVD Lungs: Bilateral rales Cardiovascular: RRR, no MRG Abdomen: Soft, non-tender, non-distended Extremities: No acute deformity or ROM limitation Neuro: Alert, oriented, non-focal  Resolved Hospital Problem list     Assessment & Plan:   Acute hypoxic respiratory failure: Recent admission for pneumonia. She did not complete a full course of antibiotics. Differential includes multifocal pneumonia and post infectious pneumonitis. Plan - Supplemental O2 to keep O2 sat > 90% (currently on  15 liter salter HFNC) - Cefepime and vancomycin per primary. She is s/p 5 day course of rocephin and azithromycin - Check sputum cultures, Urine strep, legionella  - Flonase - Protonix  - Albuterol PRN - hold off systemic steroids at this time.   Best practice:   Diet: per primary Pain/Anxiety/Delirium protocol (if indicated): na VAP protocol (if indicated): na DVT prophylaxis: per primary GI prophylaxis: ppi Glucose control: na Mobility: per primary Code Status: FULL Family Communication: Patient updated in ED Disposition: SDU  Labs   CBC: Recent Labs  Lab 04/28/18 0559 04/29/18 0457 04/30/18 0800 05/01/18 0341 05/04/18 1751  WBC 12.2* 11.2* 11.1* 9.0 16.4*  NEUTROABS  --   --   --   --  14.0*  HGB 10.6* 10.1* 9.8* 10.0* 9.7*  HCT 33.9* 33.4* 32.5* 32.9* 31.7*  MCV 93.6 95.4 95.9 95.6 95.2  PLT 331 335 340 372 516*    Basic Metabolic Panel: Recent Labs  Lab 04/28/18 0559 04/29/18 0457 04/30/18 0800 05/01/18 0341 05/04/18 1751  NA 137 138 139 139 135  K 3.2* 3.5 4.1 3.8 3.6  CL 105 106 105 104 101  CO2 24 26 27 30 25   GLUCOSE 97 90 92 78 106*  BUN 8 7 5* 5* 8  CREATININE 0.56 0.52 0.57 0.58 0.85  CALCIUM 7.9* 8.1* 8.2* 8.2* 8.2*  MG  --  2.1 1.9 2.0  --   PHOS  --   --  3.9  --   --    GFR: Estimated Creatinine Clearance: 103.8 mL/min (by C-G formula based on SCr of 0.85 mg/dL). Recent Labs  Lab 04/29/18 0457 04/30/18 0800 05/01/18 0341 05/04/18 1751  WBC 11.2* 11.1* 9.0 16.4*    Liver Function Tests: No results for input(s): AST, ALT, ALKPHOS, BILITOT, PROT, ALBUMIN in the last 168 hours. No results for input(s): LIPASE, AMYLASE in the last 168 hours. No results for input(s): AMMONIA in the last 168 hours.  ABG    Component Value Date/Time   PHART 7.423 05/04/2018 2217   PCO2ART 38.0 05/04/2018 2217   PO2ART 73.0 (L) 05/04/2018 2217   HCO3 24.9 05/04/2018 2217   TCO2 26 05/04/2018 2217   O2SAT 95.0 05/04/2018 2217     Coagulation Profile: No results for input(s): INR, PROTIME in the last 168 hours.  Cardiac Enzymes: No results for input(s): CKTOTAL, CKMB, CKMBINDEX, TROPONINI in the last 168 hours.  HbA1C: No results found for: HGBA1C  CBG: No results for input(s): GLUCAP in the last 168  hours.  Review of Systems:   Bolds are positive  Constitutional: weight loss, gain, night sweats, Fevers, chills, fatigue .  HEENT: headaches, Sore throat, sneezing, nasal congestion, post nasal drip, Difficulty swallowing, Tooth/dental problems, visual complaints visual changes, ear ache CV:  chest pain, radiates:,Orthopnea, PND, swelling in lower extremities, dizziness, palpitations, syncope.  GI  heartburn, indigestion, abdominal pain, nausea, vomiting, diarrhea, change in bowel habits, loss of appetite, bloody stools.  Resp: cough, productive:, hemoptysis, dyspnea, chest pain, pleuritic.  Skin: rash or itching or icterus GU: dysuria, change in color of urine, urgency or frequency. flank pain, hematuria  MS: joint pain or swelling. decreased range of motion  Psych: change in mood or affect. depression or anxiety.  Neuro: difficulty with speech, weakness, numbness, ataxia    Past Medical History  She,  has a past medical history of Anxiety, ASCUS of cervix with negative high risk HPV (05/22/2016), Depression, HSV infection, Hypothyroid, Hypothyroidism, and Ovarian cyst.   Surgical History  Past Surgical History:  Procedure Laterality Date  . CESAREAN SECTION N/A 09/06/2017   Procedure: CESAREAN SECTION;  Surgeon: Malachy Mood, MD;  Location: [facility] ORS;  Service: Obstetrics;  Laterality: N/A;  . DILATION AND EVACUATION N/A 06/19/2016   Procedure: DILATATION AND EVACUATION;  Surgeon: Gae Dry, MD;  Location: [facility] ORS;  Service: Gynecology;  Laterality: N/A;  . TONSILLECTOMY AND ADENOIDECTOMY    . WISDOM TOOTH EXTRACTION       Social History   reports that she has never smoked. She has never used smokeless tobacco. She reports that she does not drink alcohol or use drugs.   Family History   Her family history includes Breast cancer in her maternal grandmother; Diabetes in her mother; Hyperlipidemia in her maternal grandmother; Hypertension in her maternal grandmother;  Thyroid disease in her maternal grandmother.   Allergies Allergies  Allergen Reactions  . Azithromycin Nausea And Vomiting    Vomits if received via IV  . Benzonatate Diarrhea  . Metronidazole Other (See Comments)    Body aches, all-over side effects   . Latex Rash    NO POWDERED GLOVES!!  . Tape Rash    No Band-Aids!!     Home Medications  Prior to Admission medications   Medication Sig Start Date End Date Taking? Authorizing Provider  albuterol (PROAIR HFA) 108 (90 Base) MCG/ACT inhaler Inhale 1-2 puffs into the lungs every 6 (six) hours as needed for wheezing or shortness of breath.   Yes [provider]  clonazePAM (KLONOPIN) 1 MG tablet Take 1 mg by mouth 3 (three) times daily as needed for anxiety.  02/02/18  Yes [provider]  levonorgestrel (MIRENA) 20 MCG/24HR IUD 1 Intra Uterine Device (1 each total) by Intrauterine route once for 1 dose. 03/01/18 10/04/60 Yes Copland, Deirdre Evener, PA-C  levothyroxine (SYNTHROID, LEVOTHROID) 50 MCG tablet Take 1 tablet (50 mcg total) by mouth daily before breakfast. 11/07/17  Yes Malachy Mood, MD  omeprazole (PRILOSEC) 40 MG capsule Take 40 mg by mouth at bedtime.    Yes [provider]  sertraline (ZOLOFT) 100 MG tablet Take 1 tablet (100 mg total) by mouth daily. Patient taking differently: Take 50 mg by mouth daily with lunch.  11/06/17  Yes Malachy Mood, MD  cefdinir (OMNICEF) 300 MG capsule Take 1 capsule (300 mg total) by mouth 2 (two) times daily for 4 days. Patient not taking: Reported on 05/04/2018 05/01/18 05/05/18  Bettey Costa, MD     Critical care time:      Georgann Housekeeper, AGACNP-BC [place] Pager [phone] or [phone]  05/05/2018 12:37 AM

## 2018-05-04 NOTE — ED Provider Notes (Signed)
[place] EMERGENCY DEPARTMENT Provider Note   CSN: [id number] Arrival date & time: 05/04/18  1731     History   Chief Complaint Chief Complaint  Patient presents with  . Shortness of Breath    HPI Jenny Hendrix is a 38 y.o. female.  HPI Patient with recent hospitalization for sepsis, from primary pneumonia now presents with concern for dyspnea, fatigue. Patient notes that she was discharged a few days ago, after being hospitalized for several days She states that she lost her antibiotics Now over the past few days she has had worsening symptoms, including fatigue, dyspnea, cough It is unclear if she has a recurrent fever. She went to primary care today, and after being found to be hypoxic she was sent here for evaluation. She states that she is generally well, denies history of smoking or pulmonary disease.  Past Medical History:  Diagnosis Date  . Anxiety   . ASCUS of cervix with negative high risk HPV 05/22/2016  . Depression   . HSV infection   . Hypothyroid   . Hypothyroidism   . Ovarian cyst     Patient Active Problem List   Diagnosis Date Noted  . Sepsis ([place]) 04/28/2018  . CAP (community acquired pneumonia) 04/28/2018  . Cyst of left ovary 03/10/2018  . Encounter for planned induction of labor 09/05/2017  . Labial lesion 08/13/2017  . Pregnancy 08/13/2017  . Vomiting and diarrhea 06/01/2017  . Anxiety and depression 01/27/2017  . Supervision of high risk pregnancy, antepartum 01/27/2017  . Obesity in pregnancy 01/27/2017  . GERD (gastroesophageal reflux disease) 01/27/2017  . Hypothyroidism 01/27/2017  . Pelvic pain in female 01/12/2017    Past Surgical History:  Procedure Laterality Date  . CESAREAN SECTION N/A 09/06/2017   Procedure: CESAREAN SECTION;  Surgeon: Malachy Mood, MD;  Location: [facility] ORS;  Service: Obstetrics;  Laterality: N/A;  . DILATION AND EVACUATION N/A 06/19/2016   Procedure: DILATATION AND EVACUATION;   Surgeon: Gae Dry, MD;  Location: [facility] ORS;  Service: Gynecology;  Laterality: N/A;  . TONSILLECTOMY AND ADENOIDECTOMY    . WISDOM TOOTH EXTRACTION       OB History    Gravida  2   Para  1   Term  1   Preterm      AB  1   Living  1     SAB  1   TAB      Ectopic      Multiple  0   Live Births  1            Home Medications    Prior to Admission medications   Medication Sig Start Date End Date Taking? Authorizing Provider  Ascorbic Acid (VITAMIN C) 500 MG CHEW Chew 500 mg by mouth daily.    [provider]  cefdinir (OMNICEF) 300 MG capsule Take 1 capsule (300 mg total) by mouth 2 (two) times daily for 4 days. 05/01/18 05/05/18  Bettey Costa, MD  clonazePAM (KLONOPIN) 1 MG tablet Take 1 mg by mouth 3 (three) times daily as needed.  02/02/18   [provider]  levonorgestrel (MIRENA) 20 MCG/24HR IUD 1 Intra Uterine Device (1 each total) by Intrauterine route once for 1 dose. 03/01/18 38/4/66  Copland, Deirdre Evener, PA-C  levothyroxine (SYNTHROID, LEVOTHROID) 50 MCG tablet Take 1 tablet (50 mcg total) by mouth daily before breakfast. 11/07/17   Malachy Mood, MD  omeprazole (PRILOSEC) 40 MG capsule Take 40 mg by mouth at  bedtime.     [provider]  sertraline (ZOLOFT) 100 MG tablet Take 1 tablet (100 mg total) by mouth daily. Patient taking differently: Take 50 mg by mouth daily with lunch.  11/06/17   Malachy Mood, MD    Family History Family History  Problem Relation Age of Onset  . Diabetes Mother   . Hyperlipidemia Maternal Grandmother   . Hypertension Maternal Grandmother   . Breast cancer Maternal Grandmother   . Thyroid disease Maternal Grandmother     Social History Social History   Tobacco Use  . Smoking status: Never Smoker  . Smokeless tobacco: Never Used  Substance Use Topics  . Alcohol use: No  . Drug use: No     Allergies   Benzonatate and Metronidazole   Review of Systems Review of Systems    Constitutional:       Per HPI, otherwise negative  HENT:       Per HPI, otherwise negative  Respiratory:       Per HPI, otherwise negative  Cardiovascular:       Per HPI, otherwise negative  Gastrointestinal: Negative for vomiting.  Endocrine:       Negative aside from HPI  Genitourinary:       Neg aside from HPI   Musculoskeletal:       Per HPI, otherwise negative  Skin: Negative.   Neurological: Positive for weakness. Negative for syncope.     Physical Exam Updated Vital Signs BP 109/66   Pulse 91   Temp 98.9 F (37.2 C)   Resp (!) 22   SpO2 (!) 83%   Physical Exam Vitals signs and nursing note reviewed.  Constitutional:      General: She is not in acute distress.    Appearance: She is well-developed.  HENT:     Head: Normocephalic and atraumatic.  Eyes:     Conjunctiva/sclera: Conjunctivae normal.  Cardiovascular:     Rate and Rhythm: Normal rate and regular rhythm.  Pulmonary:     Effort: Tachypnea present.     Breath sounds: Decreased breath sounds present.  Abdominal:     General: There is no distension.  Skin:    General: Skin is warm and dry.  Neurological:     Mental Status: She is alert and oriented to person, place, and time.     Cranial Nerves: No cranial nerve deficit.      ED Treatments / Results  Labs (all labs ordered are listed, but only abnormal results are displayed) Labs Reviewed  BASIC METABOLIC PANEL - Abnormal; Notable for the following components:      Result Value   Glucose, Bld 106 (*)    Calcium 8.2 (*)    All other components within normal limits  CBC WITH DIFFERENTIAL/PLATELET - Abnormal; Notable for the following components:   WBC 16.4 (*)    RBC 3.33 (*)    Hemoglobin 9.7 (*)    HCT 31.7 (*)    Platelets 516 (*)    nRBC 0.7 (*)    Neutro Abs 14.0 (*)    Abs Immature Granulocytes 0.25 (*)    All other components within normal limits  INFLUENZA PANEL BY PCR (TYPE A & B)  I-STAT BETA HCG BLOOD, ED ([facility], [facility], [facility]  ONLY)    Radiology Ct Angio Chest Pe W And/or Wo Contrast  Result Date: 05/04/2018 CLINICAL DATA:  Chest pain, complex, intermediate/high prob of ACS/PE/AAS. Cough and shortness of breath. EXAM: CT ANGIOGRAPHY CHEST WITH CONTRAST TECHNIQUE:  Multidetector CT imaging of the chest was performed using the standard protocol during bolus administration of intravenous contrast. Multiplanar CT image reconstructions and MIPs were obtained to evaluate the vascular anatomy. CONTRAST:  32mL ISOVUE-370 IOPAMIDOL (ISOVUE-370) INJECTION 76% COMPARISON:  Chest radiograph earlier this day. Chest CT 5 days prior 04/29/2018 at [facility] FINDINGS: Cardiovascular: There are no filling defects within the pulmonary arteries to suggest pulmonary embolus. Ascending aorta measures 3.5 cm on the current exam, cardiac motion artifact limits more detailed assessment. No aortic dissection. Heart is normal in size. No pericardial effusion. Mediastinum/Nodes: Mild mediastinal and right greater than left hilar adenopathy, index node in the right paratracheal region measures 1.5 cm short axis. Findings are similar to recent prior. Esophagus is decompressed. No visualized thyroid nodule. Lungs/Pleura: Progression in diffuse ground-glass opacities throughout both lungs, with slight basilar sparing. Developing air bronchograms in the upper lobe centrally. No definite septal thickening. No pleural fluid. No pneumothorax. Trachea and mainstem bronchi are patent. Upper Abdomen: Suggestion of hepatosplenomegaly, partially included. No acute findings. Musculoskeletal: There are no acute or suspicious osseous abnormalities. Review of the MIP images confirms the above findings. IMPRESSION: 1. No pulmonary embolus. 2. Progression of diffuse ground-glass opacities throughout both lungs since CT 5 days ago, with slight basilar sparing. Developing air bronchograms in the upper lobes. Differential considerations are broad and findings may be  secondary to atypical infection, acute interstitial pneumonia, ARDS, or pulmonary hemorrhage. Atypical pulmonary edema is also considered. 3. Mild mediastinal and hilar adenopathy is similar to recent prior exam, likely reactive. 4. Ectatic ascending thoracic aorta on prior exam is not as well demonstrated on the current exam, motion artifact obscured. Electronically Signed   By: Keith Rake M.D.   On: 05/04/2018 20:08   Dg Chest Portable 1 View  Result Date: 05/04/2018 CLINICAL DATA:  Cough and shortness of breath for 2 weeks. Recent pneumonia. EXAM: PORTABLE CHEST 1 VIEW COMPARISON:  06/25/2004 FINDINGS: Heart size is within normal limits. Mild diffuse heterogeneous airspace disease is seen, which may be due to edema, atypical infection, or less likely hemorrhage. No evidence of pneumothorax or pleural effusion. IMPRESSION: Mild diffuse heterogeneous bilateral airspace disease. Differential diagnosis includes edema, atypical infection, or less likely hemorrhage. Electronically Signed   By: Earle Gell M.D.   On: 05/04/2018 18:04    Procedures Procedures (including critical care time)  Medications Ordered in ED Medications - No data to display   Initial Impression / Assessment and Plan / ED Course  I have reviewed the triage vital signs and the nursing notes.  Pertinent labs & imaging results that were available during my care of the patient were reviewed by me and considered in my medical decision making (see chart for details).    After the initial evaluation I reviewed the patient's chart, notable for recent hospitalization with sepsis, following initial evaluation demonstrated pneumonia on x-ray, with patient found to be hypoxic, febrile. Here, on room air the patient has room air hypoxia, 80/81%, this improves with supplemental oxygen. 8:14 PM CT demonstrates no pulmonary embolism, but bilateral abnormalities consistent with atypical infection, inflammation.  Influenza test negative  again.   Young female with recent diagnosis of pneumonia presents several days after that admission, now with persistent increased work of breathing, hypoxia. Here the patient is awake and alert, lower suspicion for sepsis, but with persistent hypoxia she required a nonrebreather mask for appropriate saturation. Patient's evaluation notable for demonstration of bilateral pulmonary lesions concerning for atypical infection. Patient was  restarted on antibiotics, and given need for supplemental oxygen, was admitted to the step-down unit for further evaluation and management.    Final Clinical Impressions(s) / ED Diagnoses  Hypoxia Bilateral pneumonia  CRITICAL CARE Performed by: Carmin Muskrat Total critical care time: 35 minutes Critical care time was exclusive of separately billable procedures and treating other patients. Critical care was necessary to treat or prevent imminent or life-threatening deterioration. Critical care was time spent personally by me on the following activities: development of treatment plan with patient and/or surrogate as well as nursing, discussions with consultants, evaluation of patient's response to treatment, examination of patient, obtaining history from patient or surrogate, ordering and performing treatments and interventions, ordering and review of laboratory studies, ordering and review of radiographic studies, pulse oximetry and re-evaluation of patient's condition.    Carmin Muskrat, MD 05/04/18 2016

## 2018-05-05 ENCOUNTER — Encounter (HOSPITAL_COMMUNITY): Payer: Self-pay | Admitting: *Deleted

## 2018-05-05 ENCOUNTER — Other Ambulatory Visit: Payer: Self-pay

## 2018-05-05 DIAGNOSIS — D473 Essential (hemorrhagic) thrombocythemia: Secondary | ICD-10-CM

## 2018-05-05 DIAGNOSIS — R112 Nausea with vomiting, unspecified: Secondary | ICD-10-CM

## 2018-05-05 DIAGNOSIS — R9389 Abnormal findings on diagnostic imaging of other specified body structures: Secondary | ICD-10-CM | POA: Diagnosis present

## 2018-05-05 LAB — HEPATIC FUNCTION PANEL
ALT: 14 U/L (ref 0–44)
AST: 22 U/L (ref 15–41)
Albumin: 3 g/dL — ABNORMAL LOW (ref 3.5–5.0)
Alkaline Phosphatase: 64 U/L (ref 38–126)
BILIRUBIN INDIRECT: 0.5 mg/dL (ref 0.3–0.9)
Bilirubin, Direct: 0.2 mg/dL (ref 0.0–0.2)
Total Bilirubin: 0.7 mg/dL (ref 0.3–1.2)
Total Protein: 7.6 g/dL (ref 6.5–8.1)

## 2018-05-05 LAB — RESPIRATORY PANEL BY PCR
Adenovirus: NOT DETECTED
BORDETELLA PERTUSSIS-RVPCR: NOT DETECTED
Chlamydophila pneumoniae: NOT DETECTED
Coronavirus 229E: NOT DETECTED
Coronavirus HKU1: NOT DETECTED
Coronavirus NL63: NOT DETECTED
Coronavirus OC43: NOT DETECTED
Influenza A: NOT DETECTED
Influenza B: NOT DETECTED
METAPNEUMOVIRUS-RVPPCR: NOT DETECTED
Mycoplasma pneumoniae: NOT DETECTED
Parainfluenza Virus 1: NOT DETECTED
Parainfluenza Virus 2: NOT DETECTED
Parainfluenza Virus 3: NOT DETECTED
Parainfluenza Virus 4: NOT DETECTED
Respiratory Syncytial Virus: NOT DETECTED
Rhinovirus / Enterovirus: NOT DETECTED

## 2018-05-05 LAB — PROCALCITONIN: Procalcitonin: <0.1 ng/mL

## 2018-05-05 LAB — CBC
HCT: 32.2 % — ABNORMAL LOW (ref 36.0–46.0)
Hemoglobin: 9.6 g/dL — ABNORMAL LOW (ref 12.0–15.0)
MCH: 28.6 pg (ref 26.0–34.0)
MCHC: 29.8 g/dL — ABNORMAL LOW (ref 30.0–36.0)
MCV: 95.8 fL (ref 80.0–100.0)
Platelets: 420 10*3/uL — ABNORMAL HIGH (ref 150–400)
RBC: 3.36 MIL/uL — ABNORMAL LOW (ref 3.87–5.11)
RDW: 13.9 % (ref 11.5–15.5)
WBC: 17.1 10*3/uL — AB (ref 4.0–10.5)
nRBC: 0.4 % — ABNORMAL HIGH (ref 0.0–0.2)

## 2018-05-05 LAB — EXPECTORATED SPUTUM ASSESSMENT W GRAM STAIN, RFLX TO RESP C

## 2018-05-05 LAB — MRSA PCR SCREENING: MRSA by PCR: NEGATIVE

## 2018-05-05 LAB — BRAIN NATRIURETIC PEPTIDE: B Natriuretic Peptide: 230.6 pg/mL — ABNORMAL HIGH (ref 0.0–100.0)

## 2018-05-05 LAB — SEDIMENTATION RATE: Sed Rate: 75 mm/hr — ABNORMAL HIGH (ref 0–22)

## 2018-05-05 LAB — LIPASE, BLOOD: Lipase: 26 U/L (ref 11–51)

## 2018-05-05 LAB — LACTATE DEHYDROGENASE: LDH: 275 U/L — ABNORMAL HIGH (ref 98–192)

## 2018-05-05 MED ORDER — DM-GUAIFENESIN ER 30-600 MG PO TB12
2.0000 | ORAL_TABLET | Freq: Two times a day (BID) | ORAL | Status: DC
  Administered 2018-05-06 – 2018-05-11 (×11): 2 via ORAL
  Filled 2018-05-05: qty 1
  Filled 2018-05-05 (×10): qty 2

## 2018-05-05 MED ORDER — FLUTICASONE PROPIONATE 50 MCG/ACT NA SUSP
2.0000 | Freq: Every day | NASAL | Status: DC
  Administered 2018-05-05 – 2018-05-11 (×6): 2 via NASAL
  Filled 2018-05-05: qty 16

## 2018-05-05 MED ORDER — IBUPROFEN 400 MG PO TABS
400.0000 mg | ORAL_TABLET | Freq: Once | ORAL | Status: AC
  Administered 2018-05-05: 400 mg via ORAL
  Filled 2018-05-05: qty 1

## 2018-05-05 MED ORDER — PIPERACILLIN-TAZOBACTAM 3.375 G IVPB 30 MIN: 3.3750 g | Freq: Four times a day (QID) | INTRAVENOUS | Status: DC

## 2018-05-05 MED ORDER — PIPERACILLIN-TAZOBACTAM 3.375 G IVPB
3.3750 g | Freq: Three times a day (TID) | INTRAVENOUS | Status: DC
  Administered 2018-05-05 – 2018-05-07 (×9): 3.375 g via INTRAVENOUS
  Filled 2018-05-05 (×10): qty 50

## 2018-05-05 MED ORDER — SODIUM CHLORIDE 0.9 % IV SOLN
INTRAVENOUS | Status: DC
  Administered 2018-05-05 (×2): via INTRAVENOUS

## 2018-05-05 MED ORDER — ALBUTEROL SULFATE (2.5 MG/3ML) 0.083% IN NEBU
2.5000 mg | INHALATION_SOLUTION | RESPIRATORY_TRACT | Status: DC | PRN
  Administered 2018-05-05 – 2018-05-10 (×7): 2.5 mg via RESPIRATORY_TRACT
  Filled 2018-05-05 (×8): qty 3

## 2018-05-05 MED ORDER — HYDROCOD POLST-CPM POLST ER 10-8 MG/5ML PO SUER
5.0000 mL | Freq: Once | ORAL | Status: AC
  Administered 2018-05-05: 5 mL via ORAL
  Filled 2018-05-05: qty 5

## 2018-05-05 MED ORDER — OXYCODONE-ACETAMINOPHEN 5-325 MG PO TABS
1.0000 | ORAL_TABLET | Freq: Four times a day (QID) | ORAL | Status: DC | PRN
  Administered 2018-05-05 – 2018-05-10 (×9): 1 via ORAL
  Filled 2018-05-05 (×9): qty 1

## 2018-05-05 MED ORDER — GUAIFENESIN-DM 100-10 MG/5ML PO SYRP
5.0000 mL | ORAL_SOLUTION | ORAL | Status: DC | PRN
  Administered 2018-05-05 (×4): 5 mL via ORAL
  Filled 2018-05-05 (×5): qty 5

## 2018-05-05 NOTE — Progress Notes (Signed)
PROGRESS NOTE    Jenny Hendrix  GUY:[id number] DOB: 1980-10-17 DOA: 05/04/2018 PCP: Remi Haggard, FNP    Brief Narrative:  Jenny Hendrix is a 38 y.o. female with medical history significant of anxiety, depression, hypothyroidism presenting to the hospital for evaluation of shortness of breath.  Oxygen saturation 65% on room air in triage.  Patient was recently admitted at [facility] from 12/31 to 1/4 for acute hypoxic respiratory failure and sepsis secondary to multifocal pneumonia.  She was treated with ceftriaxone and azithromycin in the hospital and discharged home on cefdinir.  Oxygen was weaned off.   Patient states she took 3 doses of cefdinir since her hospital discharge but then lost the medication.  She started having dyspnea again which has been getting progressively worse.  Having a nonproductive cough.  She presented to her primary care office and was found to be hypoxemic and then sent to the ED. Having chest pain only when coughing; no chest pain at present.  She has been having fevers, chills, decreased p.o. intake, and fatigue.  She has been feeling nauseous and vomited.  Denies having any abdominal pain.  Reports secondhand exposure to vaping (her husband vapes).  Denies smoking cigarettes or smoking any drugs.  Denies history of asthma or any other pulmonary disease.   Assessment & Plan:   Principal Problem:   Acute respiratory failure with hypoxia (HCC) Active Problems:   Anxiety and depression   GERD (gastroesophageal reflux disease)   Hypothyroidism   Multifocal pneumonia   Nausea & vomiting   Chronic anemia   Thrombocytosis (HCC)   Acute hypoxic respiratory failure secondary to multifocal pneumonia -Oxygen saturation 65% on room air and tachypneic.  She was placed on nonrebreather in the ED.  ABG showing pH 7.42, PCO2 38, PO2 73.  Currently on 15 L oxygen via high flow nasal cannula. -White count 16.4.  Patient is afebrile.  Influenza panel  negative.  CTA negative for PE. Showing progression of diffuse groundglass opacities throughout both lungs since CT done 5 days ago with slight basilar sparing.  Also showing developing air bronchograms in the upper lobes.  Findings possibly due to atypical infection or acute interstitial pneumonia.  However, there is also concern for viral infection, vaping induced lung injury (secondhand exposure), and postinfectious [place].   HIV antibody negative during recent hospitalization.  05/05/18: -Patient seen by PCCM.   Antibiotic changed to IV vancomycin, Zosyn. -Sputum culture and Gram stain -Strep pneumo urine antigen -Mucinex-DM for cough -Respiratory viral panel -Continue to monitor CBC -Supplemental oxygen  Nausea and vomiting Likely related to acute illness. Patient is not complaining of abdominal pain and abdominal exam benign. -IV fluid hydration -IV Phenergan PRN -lipase and LFTs normal.  Chronic anemia -Stable.  Hemoglobin 9.6, baseline in the 9-10 range.  Thrombocytosis Platelet count 420.  Patient does not smoke cigarettes. -Continue to monitor CBC  Anxiety and depression -Continue home Klonopin as needed -Continue home Zoloft  Hypothyroidism -Continue home Synthroid  GERD -Continue PPI   DVT prophylaxis: Lovenox subcutaneous Code Status: Full code Family Communication: No family at bedside Disposition Plan: Hopefully home once stable   Consultants:   PCCM  Procedures:   None  Antimicrobials:   IV vancomycin, Zosyn   Subjective: Patient complaining of cough, shortness of breath.  On 15 L oxygen by nasal cannula  Objective: Vitals:   05/05/18 1200 05/05/18 1300 05/05/18 1403 05/05/18 1446  BP: 111/67 124/71 96/60   Pulse: 89 88 88 84  Resp: (!) 32 (!) 23 (!) 27   Temp:   98.9 F (37.2 C)   TempSrc:   Oral Oral  SpO2: (!) 88% 95% (!) 87% 92%  Weight:   96.2 kg   Height:   5\' 3"  (1.6 m) 5\' 3"  (1.6 m)    Intake/Output Summary (Last 24  hours) at 05/05/2018 1519 Last data filed at 05/05/2018 1234 Gross per 24 hour  Intake 2000 ml  Output -  Net 2000 ml   Filed Weights   05/05/18 1403  Weight: 96.2 kg    Examination:  General exam: Complaining of shortness of breath, on oxygen by nasal cannula but does not appear to be in any acute distress Respiratory system: Decreased breath sounds lower lobes, occasional rhonchi, no wheezing Cardiovascular system: S1 & S2 heard, RRR. No JVD, murmurs, rubs, gallops or clicks. No pedal edema. Gastrointestinal system: Obese female, nondistended, soft and nontender. No organomegaly or masses felt. Normal bowel sounds heard. Central nervous system: Alert and oriented. No focal neurological deficits. Extremities: Symmetric 5 x 5 power. Skin: No rashes, lesions or ulcers Psychiatry: Judgement and insight appear normal. Mood & affect appropriate.     Data Reviewed: I have personally reviewed following labs and imaging studies  CBC: Recent Labs  Lab 04/29/18 0457 04/30/18 0800 05/01/18 0341 05/04/18 1751 05/05/18 0342  WBC 11.2* 11.1* 9.0 16.4* 17.1*  NEUTROABS  --   --   --  14.0*  --   HGB 10.1* 9.8* 10.0* 9.7* 9.6*  HCT 33.4* 32.5* 32.9* 31.7* 32.2*  MCV 95.4 95.9 95.6 95.2 95.8  PLT 335 340 372 516* 035*   Basic Metabolic Panel: Recent Labs  Lab 04/29/18 0457 04/30/18 0800 05/01/18 0341 05/04/18 1751  NA 138 139 139 135  K 3.5 4.1 3.8 3.6  CL 106 105 104 101  CO2 26 27 30 25   GLUCOSE 90 92 78 106*  BUN 7 5* 5* 8  CREATININE 0.52 0.57 0.58 0.85  CALCIUM 8.1* 8.2* 8.2* 8.2*  MG 2.1 1.9 2.0  --   PHOS  --  3.9  --   --    GFR: Estimated Creatinine Clearance: 101.9 mL/min (by C-G formula based on SCr of 0.85 mg/dL). Liver Function Tests: Recent Labs  Lab 05/04/18 1751  AST 22  ALT 14  ALKPHOS 64  BILITOT 0.7  PROT 7.6  ALBUMIN 3.0*   Recent Labs  Lab 05/04/18 1751  LIPASE 26   No results for input(s): AMMONIA in the last 168 hours. Coagulation  Profile: No results for input(s): INR, PROTIME in the last 168 hours. Cardiac Enzymes: No results for input(s): CKTOTAL, CKMB, CKMBINDEX, TROPONINI in the last 168 hours. BNP (last 3 results) No results for input(s): PROBNP in the last 8760 hours. HbA1C: No results for input(s): HGBA1C in the last 72 hours. CBG: No results for input(s): GLUCAP in the last 168 hours. Lipid Profile: No results for input(s): CHOL, HDL, LDLCALC, TRIG, CHOLHDL, LDLDIRECT in the last 72 hours. Thyroid Function Tests: No results for input(s): TSH, T4TOTAL, FREET4, T3FREE, THYROIDAB in the last 72 hours. Anemia Panel: No results for input(s): VITAMINB12, FOLATE, FERRITIN, TIBC, IRON, RETICCTPCT in the last 72 hours. Sepsis Labs: Recent Labs  Lab 05/04/18 1751  PROCALCITON <0.10    Recent Results (from the past 240 hour(s))  Blood culture (routine x 2)     Status: None   Collection Time: 04/27/18 11:21 PM  Result Value Ref Range Status   Specimen Description BLOOD BLOOD  LEFT FOREARM  Final   Special Requests   Final    BOTTLES DRAWN AEROBIC AND ANAEROBIC Blood Culture results may not be optimal due to an excessive volume of blood received in culture bottles   Culture   Final    NO GROWTH 5 DAYS Performed at [facility], [address] [place], [place] [address]    Report Status 05/02/2018 FINAL  Final  Blood culture (routine x 2)     Status: None   Collection Time: 04/27/18 11:21 PM  Result Value Ref Range Status   Specimen Description BLOOD LEFT ANTECUBITAL  Final   Special Requests   Final    BOTTLES DRAWN AEROBIC AND ANAEROBIC Blood Culture results may not be optimal due to an excessive volume of blood received in culture bottles   Culture   Final    NO GROWTH 5 DAYS Performed at [facility], [address] [place], [place] [address]    Report Status 05/02/2018 FINAL  Final  MRSA PCR Screening     Status: None   Collection Time: 04/28/18  5:00 AM  Result Value Ref  Range Status   MRSA by PCR NEGATIVE NEGATIVE Final    Comment:        The GeneXpert MRSA Assay (FDA approved for NASAL specimens only), is one component of a comprehensive MRSA colonization surveillance program. It is not intended to diagnose MRSA infection nor to guide or monitor treatment for MRSA infections. Performed at [facility], [address] [place], [place] [address]   Culture, sputum-assessment     Status: None   Collection Time: 05/05/18  7:48 AM  Result Value Ref Range Status   Specimen Description SPUTUM  Final   Special Requests NONE  Final   Sputum evaluation   Final    THIS SPECIMEN IS ACCEPTABLE FOR SPUTUM CULTURE Performed at [facility], [address], [place], [place] [address]    Report Status 05/05/2018 FINAL  Final  Respiratory Panel by PCR     Status: None   Collection Time: 05/05/18  7:48 AM  Result Value Ref Range Status   Adenovirus NOT DETECTED NOT DETECTED Final   Coronavirus 229E NOT DETECTED NOT DETECTED Final   Coronavirus HKU1 NOT DETECTED NOT DETECTED Final   Coronavirus NL63 NOT DETECTED NOT DETECTED Final   Coronavirus OC43 NOT DETECTED NOT DETECTED Final   Metapneumovirus NOT DETECTED NOT DETECTED Final   Rhinovirus / Enterovirus NOT DETECTED NOT DETECTED Final   Influenza A NOT DETECTED NOT DETECTED Final   Influenza B NOT DETECTED NOT DETECTED Final   Parainfluenza Virus 1 NOT DETECTED NOT DETECTED Final   Parainfluenza Virus 2 NOT DETECTED NOT DETECTED Final   Parainfluenza Virus 3 NOT DETECTED NOT DETECTED Final   Parainfluenza Virus 4 NOT DETECTED NOT DETECTED Final   Respiratory Syncytial Virus NOT DETECTED NOT DETECTED Final   Bordetella pertussis NOT DETECTED NOT DETECTED Final   Chlamydophila pneumoniae NOT DETECTED NOT DETECTED Final   Mycoplasma pneumoniae NOT DETECTED NOT DETECTED Final    Comment: Performed at [facility], [place] [address] [place], [place] [address]  Culture, respiratory      Status: None (Preliminary result)   Collection Time: 05/05/18  7:48 AM  Result Value Ref Range Status   Specimen Description SPUTUM  Final   Special Requests NONE Reflexed from [id number]  Final   Gram Stain   Final    ABUNDANT WBC PRESENT, PREDOMINANTLY PMN MODERATE  GRAM NEGATIVE RODS MODERATE GRAM POSITIVE COCCI Performed at [facility], [place] [address] [place], [place] [address]    Culture PENDING  Incomplete   Report Status PENDING  Incomplete         Radiology Studies: Ct Angio Chest Pe W And/or Wo Contrast  Result Date: 05/04/2018 CLINICAL DATA:  Chest pain, complex, intermediate/high prob of ACS/PE/AAS. Cough and shortness of breath. EXAM: CT ANGIOGRAPHY CHEST WITH CONTRAST TECHNIQUE: Multidetector CT imaging of the chest was performed using the standard protocol during bolus administration of intravenous contrast. Multiplanar CT image reconstructions and MIPs were obtained to evaluate the vascular anatomy. CONTRAST:  53mL ISOVUE-370 IOPAMIDOL (ISOVUE-370) INJECTION 76% COMPARISON:  Chest radiograph earlier this day. Chest CT 5 days prior 04/29/2018 at [facility]. FINDINGS: Cardiovascular: There are no filling defects within the pulmonary arteries to suggest pulmonary embolus. Ascending aorta measures 3.5 cm on the current exam, cardiac motion artifact limits more detailed assessment. No aortic dissection. Heart is normal in size. No pericardial effusion. Mediastinum/Nodes: Mild mediastinal and right greater than left hilar adenopathy, index node in the right paratracheal region measures 1.5 cm short axis. Findings are similar to recent prior. Esophagus is decompressed. No visualized thyroid nodule. Lungs/Pleura: Progression in diffuse ground-glass opacities throughout both lungs, with slight basilar sparing. Developing air bronchograms in the upper lobe centrally. No definite septal thickening. No pleural fluid. No pneumothorax. Trachea and mainstem bronchi are patent.  Upper Abdomen: Suggestion of hepatosplenomegaly, partially included. No acute findings. Musculoskeletal: There are no acute or suspicious osseous abnormalities. Review of the MIP images confirms the above findings. IMPRESSION: 1. No pulmonary embolus. 2. Progression of diffuse ground-glass opacities throughout both lungs since CT 5 days ago, with slight basilar sparing. Developing air bronchograms in the upper lobes. Differential considerations are broad and findings may be secondary to atypical infection, acute interstitial pneumonia, ARDS, or pulmonary hemorrhage. Atypical pulmonary edema is also considered. 3. Mild mediastinal and hilar adenopathy is similar to recent prior exam, likely reactive. 4. Ectatic ascending thoracic aorta on prior exam is not as well demonstrated on the current exam, motion artifact obscured. Electronically Signed   By: Keith Rake M.D.   On: 05/04/2018 20:08   Dg Chest Portable 1 View  Result Date: 05/04/2018 CLINICAL DATA:  Cough and shortness of breath for 2 weeks. Recent pneumonia. EXAM: PORTABLE CHEST 1 VIEW COMPARISON:  06/25/2004 FINDINGS: Heart size is within normal limits. Mild diffuse heterogeneous airspace disease is seen, which may be due to edema, atypical infection, or less likely hemorrhage. No evidence of pneumothorax or pleural effusion. IMPRESSION: Mild diffuse heterogeneous bilateral airspace disease. Differential diagnosis includes edema, atypical infection, or less likely hemorrhage. Electronically Signed   By: Earle Gell M.D.   On: 05/04/2018 18:04        Scheduled Meds: . dextromethorphan-guaiFENesin  1 tablet Oral BID  . enoxaparin (LOVENOX) injection  40 mg Subcutaneous Q24H  . fluticasone  2 spray Each Nare Daily  . levothyroxine  50 mcg Oral QAC breakfast  . pantoprazole  40 mg Oral Daily  . sertraline  50 mg Oral Q lunch   Continuous Infusions: . piperacillin-tazobactam (ZOSYN)  IV 3.375 g (05/05/18 1108)  . vancomycin Stopped  (05/05/18 1430)     LOS: 1 day    Time spent: 35 min    Izack Hoogland, MD [facility] Pager on amion  If 7PM-7AM, please contact night-coverage [email or web address] Password [facility] 05/05/2018, 3:19 PM

## 2018-05-05 NOTE — Progress Notes (Signed)
RN assessed patient and noted pt's sats to be in the 70's on 15L HFNC and continuously coughing. RT called, and stated, "place patient on non-rebreather and I will be to the bedside shortly". Pt currently on non-rebreather sating at 100 %, a/o X 4, continuously coughing. RN to continue to monitor.

## 2018-05-05 NOTE — Progress Notes (Signed)
Pt oxygen saturations ranging from 82-97% while on 15L intermediate high flow. Writer in to see patient and educate on breathing in through her nose and out through her mouth. Pt receptive to teaching. Continuing to monitor.

## 2018-05-05 NOTE — ED Notes (Signed)
MD notified of pt request for motrin

## 2018-05-05 NOTE — ED Notes (Signed)
Mother of pt, Jenny Hendrix, would like an update if possible, [phone]

## 2018-05-05 NOTE — ED Notes (Signed)
Called dietary and pt's breakfast has been ordered.

## 2018-05-06 ENCOUNTER — Inpatient Hospital Stay (HOSPITAL_COMMUNITY): Payer: 59

## 2018-05-06 DIAGNOSIS — D649 Anemia, unspecified: Secondary | ICD-10-CM

## 2018-05-06 DIAGNOSIS — I370 Nonrheumatic pulmonary valve stenosis: Secondary | ICD-10-CM

## 2018-05-06 DIAGNOSIS — I361 Nonrheumatic tricuspid (valve) insufficiency: Secondary | ICD-10-CM

## 2018-05-06 DIAGNOSIS — F419 Anxiety disorder, unspecified: Secondary | ICD-10-CM

## 2018-05-06 DIAGNOSIS — F329 Major depressive disorder, single episode, unspecified: Secondary | ICD-10-CM

## 2018-05-06 DIAGNOSIS — J9601 Acute respiratory failure with hypoxia: Secondary | ICD-10-CM

## 2018-05-06 DIAGNOSIS — J189 Pneumonia, unspecified organism: Principal | ICD-10-CM

## 2018-05-06 DIAGNOSIS — E039 Hypothyroidism, unspecified: Secondary | ICD-10-CM

## 2018-05-06 LAB — CBC
HCT: 26.5 % — ABNORMAL LOW (ref 36.0–46.0)
Hemoglobin: 7.8 g/dL — ABNORMAL LOW (ref 12.0–15.0)
MCH: 28.3 pg (ref 26.0–34.0)
MCHC: 29.4 g/dL — ABNORMAL LOW (ref 30.0–36.0)
MCV: 96 fL (ref 80.0–100.0)
NRBC: 0.4 % — AB (ref 0.0–0.2)
Platelets: 416 10*3/uL — ABNORMAL HIGH (ref 150–400)
RBC: 2.76 MIL/uL — ABNORMAL LOW (ref 3.87–5.11)
RDW: 14.2 % (ref 11.5–15.5)
WBC: 15.3 10*3/uL — ABNORMAL HIGH (ref 4.0–10.5)

## 2018-05-06 LAB — ECHOCARDIOGRAM COMPLETE
Height: 63 in
Weight: 3392 oz

## 2018-05-06 LAB — BASIC METABOLIC PANEL
Anion gap: 7 (ref 5–15)
BUN: 5 mg/dL — ABNORMAL LOW (ref 6–20)
CO2: 24 mmol/L (ref 22–32)
Calcium: 7.1 mg/dL — ABNORMAL LOW (ref 8.9–10.3)
Chloride: 105 mmol/L (ref 98–111)
Creatinine, Ser: 0.88 mg/dL (ref 0.44–1.00)
GFR calc Af Amer: >60 mL/min (ref >60)
GFR calc non Af Amer: >60 mL/min (ref >60)
Glucose, Bld: 96 mg/dL (ref 70–99)
Potassium: 3.7 mmol/L (ref 3.5–5.1)
SODIUM: 136 mmol/L (ref 135–145)

## 2018-05-06 LAB — PROCALCITONIN: Procalcitonin: <0.1 ng/mL

## 2018-05-06 MED ORDER — FUROSEMIDE 10 MG/ML IJ SOLN
20.0000 mg | Freq: Once | INTRAMUSCULAR | Status: AC
  Administered 2018-05-06: 20 mg via INTRAVENOUS
  Filled 2018-05-06: qty 2

## 2018-05-06 MED ORDER — SODIUM CHLORIDE 0.9% FLUSH: 10.0000 mL | INTRAVENOUS | Status: DC | PRN

## 2018-05-06 MED ORDER — METHYLPREDNISOLONE SODIUM SUCC 125 MG IJ SOLR
125.0000 mg | Freq: Four times a day (QID) | INTRAMUSCULAR | Status: AC
  Administered 2018-05-06 – 2018-05-09 (×12): 125 mg via INTRAVENOUS
  Filled 2018-05-06 (×12): qty 2

## 2018-05-06 MED ORDER — ACETAMINOPHEN 325 MG PO TABS
650.0000 mg | ORAL_TABLET | Freq: Four times a day (QID) | ORAL | Status: DC | PRN
  Administered 2018-05-06: 650 mg via ORAL
  Filled 2018-05-06: qty 2

## 2018-05-06 MED ORDER — SODIUM CHLORIDE 0.9% FLUSH
10.0000 mL | Freq: Two times a day (BID) | INTRAVENOUS | Status: DC
  Administered 2018-05-06 – 2018-05-11 (×7): 10 mL

## 2018-05-06 MED ORDER — HYDROCOD POLST-CPM POLST ER 10-8 MG/5ML PO SUER
5.0000 mL | Freq: Two times a day (BID) | ORAL | Status: DC | PRN
  Administered 2018-05-07 – 2018-05-11 (×5): 5 mL via ORAL
  Filled 2018-05-06 (×6): qty 5

## 2018-05-06 MED ORDER — POTASSIUM CHLORIDE CRYS ER 20 MEQ PO TBCR
20.0000 meq | EXTENDED_RELEASE_TABLET | Freq: Once | ORAL | Status: AC
  Administered 2018-05-06: 20 meq via ORAL
  Filled 2018-05-06: qty 1

## 2018-05-06 NOTE — Progress Notes (Signed)
NAME:  Jenny Hendrix, MRN:  [id number], DOB:  1980-05-24, LOS: 2 ADMISSION DATE:  05/04/2018, CONSULTATION DATE:  05/04/18 REFERRING MD:  Dr Marlowe Sax TR[facility], CHIEF COMPLAINT:  Pulmonary infiltrates.   Brief History   38 year old female admitted with marked hypoxia and bilateral pulmonary infiltrates. Recent admission at [facility] for multifocal PNA 12/31 ->1/4 treated with ceftriaxone, azithroycin and supplemental O2.  Hypoxia improved and discharged home on cefdinir.  Patient noncompliant with antibiotic. Condition worsened and found to be hypoxic 1/7 in the 60-80% on room air, readmitted to [facility].  No fever, ongoing cough.  Non smoker, second hand exposure to vaping at home (denies [facility] exposure). White count 16.4.  Patient is afebrile.  RVP negative.  CTA negative for PE. Showing progression of diffuse groundglass opacities throughout both lungs since CT done 5 days ago with slight basilar sparing.  Antibiotic coverage expanded to vanc and cefepime. PCCM consulted, broaden to zosyn.  BNP 230, ESR 75, PCT neg x 2, other autoimmune labs pending. Continues to require HFNC and easily desaturates with coughing/ exertion.  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  Hx Mother IDDM/ ?lupus Significant Hospital Events   12/31 > 1/4 admit to [facility] for pneumonia.   Consults:  Pulmonary 1/7 >  Procedures:    Significant Diagnostic Tests:  CTA chest 1/2 > Pneumonia involving both lungs, with upper lobe predominance. No associated pleural effusions. Mild mediastinal and RIGHT hilar lymphadenopathy, likely Reactive. RIGHT adrenal adenoma CTA chest 1/7 > Progression of diffuse ground-glass opacities throughout both lungs since CT 5 days ago, with slight basilar sparing. Developing air bronchograms in the upper lobes. Mild  mediastinal and hilar adenopathy is similar to recent prior exam, likely reactive.  Micro Data:  Sputum 1/7 > Flu > neg RVP 1/7 >  Antimicrobials:  12/31 > 1/4 Ceftriaxone/azithro 1/4 Cefdinir  Cefepime 1/7 > Vancomycin 1/7 >  Interim history/subjective:  Major complaint is rib pain from coughing.  States mucinex is helping and has small amount of brownish/ mucous sputum.    Objective   Blood pressure 117/68, pulse 88, temperature 98.1 F (36.7 C), temperature source Oral, resp. rate (!) 30, height '5\' 3"'  (1.6 m), weight 96.2 kg, SpO2 96 %, not currently breastfeeding.        Intake/Output Summary (Last 24 hours) at 05/06/2018 0811 Last data filed at 05/06/2018 0500 Gross per 24 hour  Intake 3859.93 ml  Output -  Net 3859.93 ml   Filed Weights   05/05/18 1403  Weight: 96.2 kg   Examination: General:  Young obese female sitting upright in bed in NAD HEENT: MM pink/moist, pupils 4/reactive, no obvious JVD Neuro: Alert, oriented, non focal  CV: rrr, no m/r/g, +2 pulses PULM: no obvious distress in my presence, speaking full sentences, with deep inspiration has dry coughing spell- and desaturates to 85% on HFNC 15L- quickly recovers to mid 90's, diffuse insp rales and faint end expiratory wheeze, slightly diminished in bases GI: soft, non-tender, bs active  Extremities: warm/dry, no LE edema  Skin: no rashes   Resolved Hospital Problem list     Assessment & Plan:   Acute hypoxic respiratory failure Progressive ground glass opacities w/basilar sparing despite antibiotics - recent admit 12/31-1/4 for multifocal PNA; did not complete a full course of home cefdinir.   DDX large- multifocal pneumonia vs  post infectious pneumonitis vs autoimmune process vs ILD process vs inflammatory process  - ESR 75, LDH 275, Abs Eosinophils 0.4 on differential   Plan -  Currently in SDU for last 2 days, high risk to be moved to ICU if any decompensation, anticipate patient will easily  desaturate with minimal exertion but quickly recovers at this time with rest   - Supplemental O2 to keep O2 sat > 90%, remains on HFNC at 15L - Continue zosyn/ vanc pending sputum cx. Given -MRSA PCR and-PCT x 2- may consider d/c vanc -  Await sputum cx  -  Await urine strep -  Pending IgE level -  Await remaining autoimmune labs: ANA, CCP, RF, add HSP - BNP elevated and currently net +4L; d/c IVF,  lasix 20 mg once with KCl 20 meq -  May need purwick catheter for diuresis -  Obtain baseline TTE -  After discussing with Dr. Lake Bells, will start solumedrol 125 mg q 6hr x 3 days  -  Continue flonase, protonix -  Albuterol PRN  -  Not a candidate for bronchoscopy at this time given O2 requirements   Remainder per primary.  PCCM will continue to follow  Best practice:  Diet: per primary Pain/Anxiety/Delirium protocol (if indicated): na VAP protocol (if indicated): na DVT prophylaxis: Lovenox GI prophylaxis: ppi Glucose control: na Mobility: per primary Code Status: FULL Family Communication: Patient updated at bedside Disposition: SDU  Labs   CBC: Recent Labs  Lab 04/30/18 0800 05/01/18 0341 05/04/18 1751 05/05/18 0342 05/06/18 0415  WBC 11.1* 9.0 16.4* 17.1* 15.3*  NEUTROABS  --   --  14.0*  --   --   HGB 9.8* 10.0* 9.7* 9.6* 7.8*  HCT 32.5* 32.9* 31.7* 32.2* 26.5*  MCV 95.9 95.6 95.2 95.8 96.0  PLT 340 372 516* 420* 416*    Basic Metabolic Panel: Recent Labs  Lab 04/30/18 0800 05/01/18 0341 05/04/18 1751 05/06/18 0415  NA 139 139 135 136  K 4.1 3.8 3.6 3.7  CL 105 104 101 105  CO2 '27 30 25 24  ' GLUCOSE 92 78 106* 96  BUN 5* 5* 8 5*  CREATININE 0.57 0.58 0.85 0.88  CALCIUM 8.2* 8.2* 8.2* 7.1*  MG 1.9 2.0  --   --   PHOS 3.9  --   --   --    GFR: Estimated Creatinine Clearance: 98.5 mL/min (by C-G formula based on SCr of 0.88 mg/dL). Recent Labs  Lab 05/01/18 0341 05/04/18 1751 05/05/18 0342 05/06/18 0415  PROCALCITON  --  <0.10  --  <0.10  WBC 9.0  16.4* 17.1* 15.3*    Liver Function Tests: Recent Labs  Lab 05/04/18 1751  AST 22  ALT 14  ALKPHOS 64  BILITOT 0.7  PROT 7.6  ALBUMIN 3.0*   Recent Labs  Lab 05/04/18 1751  LIPASE 26   No results for input(s): AMMONIA in the last 168 hours.  ABG    Component Value Date/Time   PHART 7.423 05/04/2018 2217   PCO2ART 38.0 05/04/2018 2217   PO2ART 73.0 (L) 05/04/2018 2217   HCO3 24.9 05/04/2018 2217   TCO2 26 05/04/2018 2217   O2SAT 95.0 05/04/2018 2217     Coagulation Profile: No results for input(s): INR, PROTIME in the last 168 hours.  Cardiac Enzymes: No results for input(s): CKTOTAL, CKMB, CKMBINDEX, TROPONINI in the last 168 hours.  HbA1C: No results found for: HGBA1C  CBG: No results for input(s): GLUCAP in the last 168 hours.  Critical care time: n/a     Kennieth Rad, MSN, AGACNP-BC [facility] Pgr: [phone] or if no answer [phone] 05/06/2018, 8:36 AM

## 2018-05-06 NOTE — Progress Notes (Signed)
  Echocardiogram 2D Echocardiogram has been performed.  Jenny Hendrix 05/06/2018, 3:11 PM

## 2018-05-06 NOTE — Progress Notes (Signed)
PROGRESS NOTE    Jenny Hendrix  TGG:[id number] DOB: Jul 15, 1980 DOA: 05/04/2018 PCP: Remi Haggard, FNP    Brief Narrative:  Jenny Hendrix is a 38 y.o. female with medical history significant of anxiety, depression, hypothyroidism presenting to the hospital for evaluation of shortness of breath.  Oxygen saturation 65% on room air in triage.  Patient was recently admitted at [facility] from 12/31 to 1/4 for acute hypoxic respiratory failure and sepsis secondary to multifocal pneumonia. Oxygen saturation 65% on room air and tachypneic.  She was placed on nonrebreather in the ED.  ABG showing pH 7.42, PCO2 38, PO2 73.  She was treated with ceftriaxone and azithromycin in the hospital and discharged home on cefdinir.  Oxygen was weaned off.   Patient states she took 3 doses of cefdinir since her hospital discharge but then lost the medication.  She started having dyspnea again which has been getting progressively worse.  Having a nonproductive cough.  She presented to her primary care office and was found to be hypoxemic and then sent to the ED. Having chest pain only when coughing; no chest pain at present.  She has been having fevers, chills, decreased p.o. intake, and fatigue.  She has been feeling nauseous and vomited.  Denies having any abdominal pain.  Reports secondhand exposure to vaping (her husband vapes).  Denies smoking cigarettes or smoking any drugs.  Denies history of asthma or any other pulmonary disease.  Subjective: Patient complaining of cough, shortness of breath.  On 15 L oxygen by nasal cannula, saturating 89 to 90%. Still having bouts of cough. Reports intolerance to tessalon pearls.   Objective: Vitals:   05/06/18 0439 05/06/18 0508 05/06/18 1231 05/06/18 2029  BP: 117/68  118/68   Pulse: 91 88    Resp: (!) 26 (!) 30    Temp: 98.1 F (36.7 C)  99 F (37.2 C) (!) 97 F (36.1 C)  TempSrc: Oral  Oral Axillary  SpO2: 98% 96%    Weight:      Height:         Intake/Output Summary (Last 24 hours) at 05/06/2018 2041 Last data filed at 05/06/2018 1300 Gross per 24 hour  Intake 3289.93 ml  Output -  Net 3289.93 ml   Filed Weights   05/05/18 1403  Weight: 96.2 kg    Examination:  General exam: Complaining of shortness of breath, on oxygen by nasal cannula but does not appear to be in any acute distress Respiratory system: Decreased breath sounds lower lobes, occasional rhonchi, no wheezing Cardiovascular system: S1 & S2 heard, RRR. No JVD, murmurs, rubs, gallops or clicks. No pedal edema. Gastrointestinal system: Obese female, nondistended, soft and nontender. No organomegaly or masses felt. Normal bowel sounds heard. Central nervous system: Alert and oriented. No focal neurological deficits. Extremities: Symmetric 5 x 5 power. Skin: No rashes, lesions or ulcers Psychiatry: Judgement and insight appear normal. Mood & affect appropriate.     Data Reviewed: I have personally reviewed following labs and imaging studies  CBC: Recent Labs  Lab 04/30/18 0800 05/01/18 0341 05/04/18 1751 05/05/18 0342 05/06/18 0415  WBC 11.1* 9.0 16.4* 17.1* 15.3*  NEUTROABS  --   --  14.0*  --   --   HGB 9.8* 10.0* 9.7* 9.6* 7.8*  HCT 32.5* 32.9* 31.7* 32.2* 26.5*  MCV 95.9 95.6 95.2 95.8 96.0  PLT 340 372 516* 420* 703*   Basic Metabolic Panel: Recent Labs  Lab 04/30/18 0800 05/01/18 0341 05/04/18 1751  05/06/18 0415  NA 139 139 135 136  K 4.1 3.8 3.6 3.7  CL 105 104 101 105  CO2 27 30 25 24   GLUCOSE 92 78 106* 96  BUN 5* 5* 8 5*  CREATININE 0.57 0.58 0.85 0.88  CALCIUM 8.2* 8.2* 8.2* 7.1*  MG 1.9 2.0  --   --   PHOS 3.9  --   --   --    GFR: Estimated Creatinine Clearance: 98.5 mL/min (by C-G formula based on SCr of 0.88 mg/dL). Liver Function Tests: Recent Labs  Lab 05/04/18 1751  AST 22  ALT 14  ALKPHOS 64  BILITOT 0.7  PROT 7.6  ALBUMIN 3.0*   Recent Labs  Lab 05/04/18 1751  LIPASE 26   No results for input(s):  AMMONIA in the last 168 hours. Coagulation Profile: No results for input(s): INR, PROTIME in the last 168 hours. Cardiac Enzymes: No results for input(s): CKTOTAL, CKMB, CKMBINDEX, TROPONINI in the last 168 hours. BNP (last 3 results) No results for input(s): PROBNP in the last 8760 hours. HbA1C: No results for input(s): HGBA1C in the last 72 hours. CBG: No results for input(s): GLUCAP in the last 168 hours. Lipid Profile: No results for input(s): CHOL, HDL, LDLCALC, TRIG, CHOLHDL, LDLDIRECT in the last 72 hours. Thyroid Function Tests: No results for input(s): TSH, T4TOTAL, FREET4, T3FREE, THYROIDAB in the last 72 hours. Anemia Panel: No results for input(s): VITAMINB12, FOLATE, FERRITIN, TIBC, IRON, RETICCTPCT in the last 72 hours. Sepsis Labs: Recent Labs  Lab 05/04/18 1751 05/06/18 0415  PROCALCITON <0.10 <0.10    Recent Results (from the past 240 hour(s))  Blood culture (routine x 2)     Status: None   Collection Time: 04/27/18 11:21 PM  Result Value Ref Range Status   Specimen Description BLOOD BLOOD LEFT FOREARM  Final   Special Requests   Final    BOTTLES DRAWN AEROBIC AND ANAEROBIC Blood Culture results may not be optimal due to an excessive volume of blood received in culture bottles   Culture   Final    NO GROWTH 5 DAYS Performed at [facility], [address] [place], [place] [address]    Report Status 05/02/2018 FINAL  Final  Blood culture (routine x 2)     Status: None   Collection Time: 04/27/18 11:21 PM  Result Value Ref Range Status   Specimen Description BLOOD LEFT ANTECUBITAL  Final   Special Requests   Final    BOTTLES DRAWN AEROBIC AND ANAEROBIC Blood Culture results may not be optimal due to an excessive volume of blood received in culture bottles   Culture   Final    NO GROWTH 5 DAYS Performed at [facility], [address] [place], [place] [address]    Report Status 05/02/2018 FINAL  Final  MRSA PCR Screening     Status:  None   Collection Time: 04/28/18  5:00 AM  Result Value Ref Range Status   MRSA by PCR NEGATIVE NEGATIVE Final    Comment:        The GeneXpert MRSA Assay (FDA approved for NASAL specimens only), is one component of a comprehensive MRSA colonization surveillance program. It is not intended to diagnose MRSA infection nor to guide or monitor treatment for MRSA infections. Performed at [facility], [address] [place], [place] [address]   Culture, sputum-assessment     Status: None   Collection Time: 05/05/18  7:48 AM  Result Value Ref Range Status  Specimen Description SPUTUM  Final   Special Requests NONE  Final   Sputum evaluation   Final    THIS SPECIMEN IS ACCEPTABLE FOR SPUTUM CULTURE Performed at [facility], [address] [place], [place] [address]    Report Status 05/05/2018 FINAL  Final  Respiratory Panel by PCR     Status: None   Collection Time: 05/05/18  7:48 AM  Result Value Ref Range Status   Adenovirus NOT DETECTED NOT DETECTED Final   Coronavirus 229E NOT DETECTED NOT DETECTED Final   Coronavirus HKU1 NOT DETECTED NOT DETECTED Final   Coronavirus NL63 NOT DETECTED NOT DETECTED Final   Coronavirus OC43 NOT DETECTED NOT DETECTED Final   Metapneumovirus NOT DETECTED NOT DETECTED Final   Rhinovirus / Enterovirus NOT DETECTED NOT DETECTED Final   Influenza A NOT DETECTED NOT DETECTED Final   Influenza B NOT DETECTED NOT DETECTED Final   Parainfluenza Virus 1 NOT DETECTED NOT DETECTED Final   Parainfluenza Virus 2 NOT DETECTED NOT DETECTED Final   Parainfluenza Virus 3 NOT DETECTED NOT DETECTED Final   Parainfluenza Virus 4 NOT DETECTED NOT DETECTED Final   Respiratory Syncytial Virus NOT DETECTED NOT DETECTED Final   Bordetella pertussis NOT DETECTED NOT DETECTED Final   Chlamydophila pneumoniae NOT DETECTED NOT DETECTED Final   Mycoplasma pneumoniae NOT DETECTED NOT DETECTED Final    Comment: Performed at [facility], [place] [address] [place], [place] [address]  Culture, respiratory     Status: None (Preliminary result)   Collection Time: 05/05/18  7:48 AM  Result Value Ref Range Status   Specimen Description SPUTUM  Final   Special Requests NONE Reflexed from [id number]  Final   Gram Stain   Final    ABUNDANT WBC PRESENT, PREDOMINANTLY PMN MODERATE GRAM NEGATIVE RODS MODERATE GRAM POSITIVE COCCI    Culture   Final    CULTURE REINCUBATED FOR BETTER GROWTH Performed at [facility], [place] [address], [place], [place] [address]    Report Status PENDING  Incomplete  MRSA PCR Screening     Status: None   Collection Time: 05/05/18  3:28 PM  Result Value Ref Range Status   MRSA by PCR NEGATIVE NEGATIVE Final    Comment:        The GeneXpert MRSA Assay (FDA approved for NASAL specimens only), is one component of a comprehensive MRSA colonization surveillance program. It is not intended to diagnose MRSA infection nor to guide or monitor treatment for MRSA infections. Performed at [facility], [place] [address] [place], [place] [address]          Radiology Studies: No results found.      Scheduled Meds: . dextromethorphan-guaiFENesin  2 tablet Oral BID  . enoxaparin (LOVENOX) injection  40 mg Subcutaneous Q24H  . fluticasone  2 spray Each Nare Daily  . levothyroxine  50 mcg Oral QAC breakfast  . methylPREDNISolone (SOLU-MEDROL) injection  125 mg Intravenous Q6H  . pantoprazole  40 mg Oral Daily  . sertraline  50 mg Oral Q lunch  . sodium chloride flush  10-40 mL Intracatheter Q12H   Continuous Infusions: . piperacillin-tazobactam (ZOSYN)  IV 3.375 g (05/06/18 1850)   Assessment & Plan:   Principal Problem:   Acute respiratory failure with hypoxia (HCC) Active Problems:   Anxiety and depression   GERD (gastroesophageal reflux disease)   Hypothyroidism   Multifocal pneumonia   Chronic anemia   Thrombocytosis (HCC)   Abnormal CXR  Acute hypoxic respiratory failure secondary  to multifocal pneumonia - Currently on 15 L oxygen via high flow nasal cannula. -White count 16.4.  Patient is afebrile.  Influenza panel / viral biofire negative.  CTA negative for PE. Showing progression of diffuse groundglass opacities throughout both lungs since CT done 5 days ago with slight basilar sparing.  Also showing developing air bronchograms in the upper lobes.  Findings possibly due to atypical infection or acute interstitial pneumonia.  However, there is also concern for vaping induced lung injury (secondhand exposure), and postinfectious Boop.   HIV antibody negative during recent hospitalization. Patient followed by PCCM.  Now on IV Zosyn as steroids. -Sputum culture and Gram stain  -Strep pneumo urine antigen -Mucinex-DM scheduled and tussinex prn (tolerated dose yesterday)for cough  Chronic anemia -Stable.  Hemoglobin 9.6, baseline in the 9-10 range.  Thrombocytosis Platelet count 420.  Patient does not smoke cigarettes. -Continue to monitor CBC  Anxiety and depression -Continue home Klonopin as needed -Continue home Zoloft  Hypothyroidism -Continue home Synthroid  GERD -Continue PPI   DVT prophylaxis: Lovenox subcutaneous Code Status: Full code Family Communication: d/w patient and friend bedside  Disposition Plan: home once stable     LOS: 2 days    Time spent: 35 min    [place]  [facility] Pager on amion  If 7PM-7AM, please contact night-coverage [email or web address] Password [facility] 05/06/2018, 8:41 PM

## 2018-05-07 ENCOUNTER — Inpatient Hospital Stay (HOSPITAL_COMMUNITY): Payer: 59

## 2018-05-07 DIAGNOSIS — K219 Gastro-esophageal reflux disease without esophagitis: Secondary | ICD-10-CM

## 2018-05-07 LAB — CULTURE, RESPIRATORY

## 2018-05-07 LAB — PROCALCITONIN: Procalcitonin: <0.1 ng/mL

## 2018-05-07 LAB — STREP PNEUMONIAE URINARY ANTIGEN: Strep Pneumo Urinary Antigen: NEGATIVE

## 2018-05-07 LAB — TYPE AND SCREEN
ABO/RH(D): A POS
Antibody Screen: NEGATIVE

## 2018-05-07 LAB — CULTURE, RESPIRATORY W GRAM STAIN: Culture: NORMAL

## 2018-05-07 LAB — EXTRACTABLE NUCLEAR ANTIGEN ANTIBODY
ENA SM Ab Ser-aCnc: 0.2 AI (ref 0.0–0.9)
Ribonucleic Protein: 0.8 AI (ref 0.0–0.9)
SSA (Ro) (ENA) Antibody, IgG: <0.2 AI (ref 0.0–0.9)
SSB (La) (ENA) Antibody, IgG: <0.2 AI (ref 0.0–0.9)
Scleroderma (Scl-70) (ENA) Antibody, IgG: 0.4 AI (ref 0.0–0.9)
ds DNA Ab: <1 IU/mL (ref 0–9)

## 2018-05-07 LAB — RHEUMATOID FACTOR: Rheumatoid fact SerPl-aCnc: 14.6 IU/mL — ABNORMAL HIGH (ref 0.0–13.9)

## 2018-05-07 LAB — PROTIME-INR
INR: 1.13
PROTHROMBIN TIME: 14.4 s (ref 11.4–15.2)

## 2018-05-07 LAB — RAPID URINE DRUG SCREEN, HOSP PERFORMED
Amphetamines: NOT DETECTED
Barbiturates: NOT DETECTED
Benzodiazepines: NOT DETECTED
Cocaine: NOT DETECTED
Opiates: POSITIVE — AB
Tetrahydrocannabinol: NOT DETECTED

## 2018-05-07 LAB — ABO/RH: ABO/RH(D): A POS

## 2018-05-07 LAB — CYCLIC CITRUL PEPTIDE ANTIBODY, IGG/IGA: CCP Antibodies IgG/IgA: 8 units (ref 0–19)

## 2018-05-07 MED ORDER — SODIUM CHLORIDE 0.9 % IV SOLN
INTRAVENOUS | Status: DC | PRN
  Administered 2018-05-10: 1000 mL via INTRAVENOUS

## 2018-05-07 NOTE — Progress Notes (Addendum)
PROGRESS NOTE    Jenny Hendrix  ESP:[id number] DOB: [birth year] DOA: 05/04/2018 PCP: Remi Haggard, FNP    Brief Narrative:  Jenny Hendrix is a 38 y.o. female with medical history significant of anxiety, depression, hypothyroidism presenting to the hospital for evaluation of shortness of breath.  Oxygen saturation 65% on room air in triage.  Patient was recently admitted at [facility] from 12/31 to 1/4 for acute hypoxic respiratory failure and sepsis secondary to multifocal pneumonia. Oxygen saturation 65% on room air and tachypneic.  She was placed on nonrebreather in the ED.  ABG showing pH 7.42, PCO2 38, PO2 73.  She was treated with ceftriaxone and azithromycin in the hospital and discharged home on cefdinir.  Oxygen was weaned off.   Patient states she took 3 doses of cefdinir since her hospital discharge but then lost the medication.  She started having dyspnea again which has been getting progressively worse.  Having a nonproductive cough.  She presented to her primary care office and was found to be hypoxemic and then sent to the ED. Having chest pain only when coughing; no chest pain at present.  She has been having fevers, chills, decreased p.o. intake, and fatigue.  She has been feeling nauseous and vomited.  Denies having any abdominal pain.  Reports secondhand exposure to vaping (her husband vapes).  Denies smoking cigarettes or smoking any drugs.  Denies history of asthma or any other pulmonary disease.  Subjective: Patient sleeping comfortably but noted to be snoring heavily.  She states she tends to do that when she is sick and sometimes even at baseline.  Tapered down to 10 L high flow oxygen , saturating  92%. Cough improved.   Objective: Vitals:   05/07/18 0722 05/07/18 1000 05/07/18 1311 05/07/18 1451  BP: 128/84     Pulse: 78  76 75  Resp: (!) 26  (!) 23 18  Temp:    98.1 F (36.7 C)  TempSrc:    Oral  SpO2: 93% (!) 82% 92% 90%  Weight:      Height:         Intake/Output Summary (Last 24 hours) at 05/07/2018 1852 Last data filed at 05/07/2018 6333 Gross per 24 hour  Intake 126.05 ml  Output 100 ml  Net 26.05 ml   Filed Weights   05/05/18 1403  Weight: 96.2 kg    Examination:  General exam: Complaining of shortness of breath, on oxygen by nasal cannula but does not appear to be in any acute distress Respiratory system: Decreased breath sounds lower lobes, occasional rhonchi, no wheezing Cardiovascular system: S1 & S2 heard, RRR. No JVD, murmurs, rubs, gallops or clicks. No pedal edema. Gastrointestinal system: Obese female, nondistended, soft and nontender. No organomegaly or masses felt. Normal bowel sounds heard. Central nervous system: Somnolent but arousable and oriented. No focal neurological deficits. Extremities: Symmetric 5 x 5 power. Skin: No rashes, lesions or ulcers Psychiatry: Judgement and insight appear normal. Mood & affect appropriate.     Data Reviewed: I have personally reviewed following labs and imaging studies  CBC: Recent Labs  Lab 05/01/18 0341 05/04/18 1751 05/05/18 0342 05/06/18 0415  WBC 9.0 16.4* 17.1* 15.3*  NEUTROABS  --  14.0*  --   --   HGB 10.0* 9.7* 9.6* 7.8*  HCT 32.9* 31.7* 32.2* 26.5*  MCV 95.6 95.2 95.8 96.0  PLT 372 516* 420* 545*   Basic Metabolic Panel: Recent Labs  Lab 05/01/18 0341 05/04/18 1751 05/06/18 0415  NA 139 135 136  K 3.8 3.6 3.7  CL 104 101 105  CO2 30 25 24   GLUCOSE 78 106* 96  BUN 5* 8 5*  CREATININE 0.58 0.85 0.88  CALCIUM 8.2* 8.2* 7.1*  MG 2.0  --   --    GFR: Estimated Creatinine Clearance: 98.5 mL/min (by C-G formula based on SCr of 0.88 mg/dL). Liver Function Tests: Recent Labs  Lab 05/04/18 1751  AST 22  ALT 14  ALKPHOS 64  BILITOT 0.7  PROT 7.6  ALBUMIN 3.0*   Recent Labs  Lab 05/04/18 1751  LIPASE 26   No results for input(s): AMMONIA in the last 168 hours. Coagulation Profile: Recent Labs  Lab 05/07/18 1154  INR 1.13    Cardiac Enzymes: No results for input(s): CKTOTAL, CKMB, CKMBINDEX, TROPONINI in the last 168 hours. BNP (last 3 results) No results for input(s): PROBNP in the last 8760 hours. HbA1C: No results for input(s): HGBA1C in the last 72 hours. CBG: No results for input(s): GLUCAP in the last 168 hours. Lipid Profile: No results for input(s): CHOL, HDL, LDLCALC, TRIG, CHOLHDL, LDLDIRECT in the last 72 hours. Thyroid Function Tests: No results for input(s): TSH, T4TOTAL, FREET4, T3FREE, THYROIDAB in the last 72 hours. Anemia Panel: No results for input(s): VITAMINB12, FOLATE, FERRITIN, TIBC, IRON, RETICCTPCT in the last 72 hours. Sepsis Labs: Recent Labs  Lab 05/04/18 1751 05/06/18 0415 05/07/18 0357  PROCALCITON <0.10 <0.10 <0.10    Recent Results (from the past 240 hour(s))  Blood culture (routine x 2)     Status: None   Collection Time: 04/27/18 11:21 PM  Result Value Ref Range Status   Specimen Description BLOOD BLOOD LEFT FOREARM  Final   Special Requests   Final    BOTTLES DRAWN AEROBIC AND ANAEROBIC Blood Culture results may not be optimal due to an excessive volume of blood received in culture bottles   Culture   Final    NO GROWTH 5 DAYS Performed at [facility], [address] [place], [place] [address]    Report Status 05/02/2018 FINAL  Final  Blood culture (routine x 2)     Status: None   Collection Time: 04/27/18 11:21 PM  Result Value Ref Range Status   Specimen Description BLOOD LEFT ANTECUBITAL  Final   Special Requests   Final    BOTTLES DRAWN AEROBIC AND ANAEROBIC Blood Culture results may not be optimal due to an excessive volume of blood received in culture bottles   Culture   Final    NO GROWTH 5 DAYS Performed at [facility], [address] [place], [place] [address]    Report Status 05/02/2018 FINAL  Final  MRSA PCR Screening     Status: None   Collection Time: 04/28/18  5:00 AM  Result Value Ref Range Status   MRSA by PCR  NEGATIVE NEGATIVE Final    Comment:        The GeneXpert MRSA Assay (FDA approved for NASAL specimens only), is one component of a comprehensive MRSA colonization surveillance program. It is not intended to diagnose MRSA infection nor to guide or monitor treatment for MRSA infections. Performed at [facility], [address] [place], [place] [address]   Culture, sputum-assessment     Status: None   Collection Time: 05/05/18  7:48 AM  Result Value Ref Range Status   Specimen Description SPUTUM  Final   Special Requests NONE  Final   Sputum evaluation   Final  THIS SPECIMEN IS ACCEPTABLE FOR SPUTUM CULTURE Performed at [facility], [place] [address] [place], [place] [address]    Report Status 05/05/2018 FINAL  Final  Respiratory Panel by PCR     Status: None   Collection Time: 05/05/18  7:48 AM  Result Value Ref Range Status   Adenovirus NOT DETECTED NOT DETECTED Final   Coronavirus 229E NOT DETECTED NOT DETECTED Final   Coronavirus HKU1 NOT DETECTED NOT DETECTED Final   Coronavirus NL63 NOT DETECTED NOT DETECTED Final   Coronavirus OC43 NOT DETECTED NOT DETECTED Final   Metapneumovirus NOT DETECTED NOT DETECTED Final   Rhinovirus / Enterovirus NOT DETECTED NOT DETECTED Final   Influenza A NOT DETECTED NOT DETECTED Final   Influenza B NOT DETECTED NOT DETECTED Final   Parainfluenza Virus 1 NOT DETECTED NOT DETECTED Final   Parainfluenza Virus 2 NOT DETECTED NOT DETECTED Final   Parainfluenza Virus 3 NOT DETECTED NOT DETECTED Final   Parainfluenza Virus 4 NOT DETECTED NOT DETECTED Final   Respiratory Syncytial Virus NOT DETECTED NOT DETECTED Final   Bordetella pertussis NOT DETECTED NOT DETECTED Final   Chlamydophila pneumoniae NOT DETECTED NOT DETECTED Final   Mycoplasma pneumoniae NOT DETECTED NOT DETECTED Final    Comment: Performed at [facility], [place] [address], [place], [place] [address]  Culture, respiratory     Status: None   Collection  Time: 05/05/18  7:48 AM  Result Value Ref Range Status   Specimen Description SPUTUM  Final   Special Requests NONE Reflexed from [id number]  Final   Gram Stain   Final    ABUNDANT WBC PRESENT, PREDOMINANTLY PMN MODERATE GRAM NEGATIVE RODS MODERATE GRAM POSITIVE COCCI    Culture   Final    Consistent with normal respiratory flora. Performed at [facility], [place] [address] [place], [place] [address]    Report Status 05/07/2018 FINAL  Final  MRSA PCR Screening     Status: None   Collection Time: 05/05/18  3:28 PM  Result Value Ref Range Status   MRSA by PCR NEGATIVE NEGATIVE Final    Comment:        The GeneXpert MRSA Assay (FDA approved for NASAL specimens only), is one component of a comprehensive MRSA colonization surveillance program. It is not intended to diagnose MRSA infection nor to guide or monitor treatment for MRSA infections. Performed at [facility], [place] [address], [place], [place] [address]          Radiology Studies: Dg Chest Port 1 View  Result Date: 05/07/2018 CLINICAL DATA:  Respiratory insufficiency, shortness of breath, question pneumonia EXAM: PORTABLE CHEST 1 VIEW COMPARISON:  Portable exam 0715 hours compared to 05/04/2018 FINDINGS: Enlargement of cardiac silhouette. Mediastinal contour stable. Diffuse BILATERAL airspace infiltrates increased since 05/04/2018 especially in upper lobes. No gross pleural effusion or pneumothorax. Osseous structures unremarkable. IMPRESSION: Enlargement of cardiac silhouette. Progressive BILATERAL airspace infiltrates especially in the upper lobes, question pneumonia particularly atypical etiologies, less likely edema or pulmonary hemorrhage. Electronically Signed   By: Lavonia Dana M.D.   On: 05/07/2018 08:57        Scheduled Meds: . dextromethorphan-guaiFENesin  2 tablet Oral BID  . enoxaparin (LOVENOX) injection  40 mg Subcutaneous Q24H  . fluticasone  2 spray Each Nare Daily  . levothyroxine  50 mcg  Oral QAC breakfast  . methylPREDNISolone (SOLU-MEDROL) injection  125 mg Intravenous Q6H  . pantoprazole  40 mg Oral Daily  . sertraline  50 mg Oral Q lunch  . sodium chloride flush  10-40 mL Intracatheter Q12H   Continuous Infusions:  Assessment & Plan:   Principal Problem:   Acute respiratory failure with hypoxia (HCC) Active Problems:   Anxiety and depression   GERD (gastroesophageal reflux disease)   Hypothyroidism   Multifocal pneumonia   Chronic anemia   Thrombocytosis (HCC)   Abnormal CXR   Acute hypoxic respiratory failure secondary to multifocal pneumonia - Currently down to 10 L oxygen via high flow nasal cannula.  Patient is afebrile.  Influenza panel / viral biofire negative.  CTA negative for PE. Showing progression of diffuse groundglass opacities throughout both lungs since CT done 5 days ago with slight basilar sparing.  Also showing developing air bronchograms in the upper lobes.  Findings possibly due to atypical infection or acute interstitial pneumonia.  However, there is also concern for vaping induced lung injury (secondhand exposure), and postinfectious Boop.   HIV antibody negative during recent hospitalization. Patient followed by PCCM.  Now on IV Zosyn and steroids. F/u Strep pneumo urine antigen and ANCA ordered by PCCM -Mucinex-DM scheduled and tussinex prn for cough  Chronic anemia -Stable.  Hemoglobin 9.6, baseline in the 9-10 range.  Thrombocytosis Platelet count 420.  Patient does not smoke cigarettes. -Continue to monitor CBC  Anxiety and depression -Continue home Klonopin as needed -Continue home Zoloft  Hypothyroidism -Continue home Synthroid  GERD -Continue PPI   DVT prophylaxis: Lovenox subcutaneous Code Status: Full code Family Communication: d/w patient  Disposition Plan: home once stable     LOS: 3 days    Time spent: 35 min    Galileo Colello  [facility] Pager on amion  If 7PM-7AM, please contact  night-coverage [email or web address] Password [facility] 05/07/2018, 6:52 PM

## 2018-05-07 NOTE — Progress Notes (Signed)
NAME:  Jenny Hendrix, MRN:  [id number], DOB:  1980-11-19, LOS: 3 ADMISSION DATE:  05/04/2018, CONSULTATION DATE:  05/04/18 REFERRING MD:  Dr Marlowe Sax TRH, CHIEF COMPLAINT:  Pulmonary infiltrates.   Brief History   38 year old female admitted with marked hypoxia and bilateral pulmonary infiltrates. Recent admission at [facility] for multifocal PNA 12/31 ->1/4 treated with ceftriaxone, azithroycin and supplemental O2.  Hypoxia improved and discharged home on cefdinir.  Patient noncompliant with antibiotic. Condition worsened and found to be hypoxic 1/7 in the 60-80% on room air, readmitted to [facility].  No fever, ongoing cough.  Non smoker, second hand exposure to vaping at home (denies [facility] exposure). White count 16.4.  Patient is afebrile.  RVP negative.  CTA negative for PE. Showing progression of diffuse groundglass opacities throughout both lungs since CT done 5 days ago with slight basilar sparing.  Antibiotic coverage expanded to vanc and cefepime. PCCM consulted, broaden to zosyn.  BNP 230, ESR 75, PCT neg x 2, other autoimmune labs pending. Continues to require HFNC and easily desaturates with coughing/ exertion.  1/9 remains on HFNC at 15L with coughing episodes.  Lasix 50m x 1 and high dose steroids started.   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  Patient denies familial autoimmune history- confirmed 1/10 Significant Hospital Events   [facility]   Consults:  Pulmonary 1/7 >  Procedures:    Significant Diagnostic Tests:  CTA chest 1/2 > Pneumonia involving both lungs, with upper lobe predominance. No associated pleural effusions. Mild mediastinal and RIGHT hilar lymphadenopathy, likely Reactive. RIGHT adrenal adenoma CTA chest 1/7 > Progression of diffuse ground-glass  opacities throughout both lungs since CT 5 days ago, with slight basilar sparing. Developing air bronchograms in the upper lobes. Mild mediastinal and hilar adenopathy is similar to recent prior exam, likely reactive.  TTE 1/9 >> LVEF 55-60%, no regional wall motion abnormalities; LA mildly dilated, no PFO identified, moderate TR, PAP 51 mmHg  Micro Data:  Sputum 1/7 > Flu > neg RVP 1/7 >neg  Antimicrobials:  12/31 > 1/4 Ceftriaxone/azithro 1/4 Cefdinir  Cefepime 1/7 > Vancomycin 1/7 >1/9  Interim history/subjective:  Overall, feels and breathing better per patient. Looks better.  Ongoing poor appetite with some intermittent nausea.  Coughing spells better today w/ "milky yellowish-red" productive sputum.  Rib pain only with coughing. Has remained on HFNC at 15L, less desaturation episodes with exertion/ coughing per patient. Remains febrile and hemodynamically stable Hgb trending down Still awaiting most autoimmune workup, RF mildly elevated  Unclear diuresis after lasix yesterday, specific I/Os not measured TTE w/ LVEF 55-60%, no regional wall motion abnormalities; LA mildly dilated, no PFO identified, moderate TR, PAP 51 mmHg  Objective   Blood pressure 128/84, pulse 78, temperature 97.7 F (36.5 C), temperature source Oral, resp. rate (!) 26, height _0  (1.6 m), weight 96.2 kg, SpO2 93 %, not currently breastfeeding.      Intake/Output Summary (Last 24 hours) at 05/07/2018 0859 Last data filed at 05/07/2018 0300 Gross per 24 hour  Intake 416.05 ml  Output 100 ml  Net 316.05 ml   Filed Weights   05/05/18 1403  Weight: 96.2 kg   Examination: General:  Pleasant young obese female standing beside bed after bath, in NAD and talkative without dyspnea or desaturation  HEENT:  MM pink/moist Neuro: Alert, oriented, non focal  CV: SR 60's, rrr, no m/r/g PULM: even/non-labored, speaking full sentences, diffuse inspiratory crackles  KG:SUPJ, non-tender, bs active  Extremities:  warm/dry, no edema  Skin: no rashes   On my exam, HFNC weaned from 15 to 10L and remained > 94% without desaturations while standing and talking without dyspnea.  No coughing episodes.  Resolved Hospital Problem list     Assessment & Plan:   Acute respiratory failure with hypoxemia with upper lobe predominant nodular infiltrate - recent admit 12/31-1/4 for multifocal PNA; did not complete a full course of home cefdinir.   DDX large- multifocal pneumonia vs post infectious pneumonitis vs autoimmune process vs inflammatory process  - ESR 75, LDH 275, Abs Eosinophils 0.4 on differential  - RF mildly elevated    Plan - Wean HFNC as able to keep sats > 93% - CXR with progressive bilateral upper infiltrates since 1/7 imaging - Continue zosyn d4/x, for now- PCT remains neg x 3, likely can d/c given neg sputum cx, afebrile -  Await urine strep -  Pending IgE level -  Await remaining autoimmune labs: ANA, CCP, HSP - TTE results as above - PAP 51 mm Hg -  continue solumedrol 125 mg q 6hr x 3 days, started 1/9 -  Continue flonase, protonix -  Albuterol PRN  - - ? Concern for DAH given hgb drop, progressive infiltrates and subjective reports of changing sputum production from brown to red, RF +, will add ANCA titers.   -  UA without RBCs and protein 100. -  Check coags, send T&S   Remainder per primary.  PCCM will continue to follow  Best practice:  Diet: per primary Pain/Anxiety/Delirium protocol (if indicated): na VAP protocol (if indicated): na DVT prophylaxis: Lovenox GI prophylaxis: ppi Glucose control: na Mobility: per primary Code Status: FULL Family Communication: Patient updated at bedside Disposition: SDU  Labs   CBC: Recent Labs  Lab 05/01/18 0341 05/04/18 1751 05/05/18 0342 05/06/18 0415  WBC 9.0 16.4* 17.1* 15.3*  NEUTROABS  --  14.0*  --   --   HGB 10.0* 9.7* 9.6* 7.8*  HCT 32.9* 31.7* 32.2* 26.5*  MCV 95.6 95.2 95.8 96.0  PLT 372 516* 420* 416*    Basic  Metabolic Panel: Recent Labs  Lab 05/01/18 0341 05/04/18 1751 05/06/18 0415  NA 139 135 136  K 3.8 3.6 3.7  CL 104 101 105  CO2 _0 GLUCOSE 78 106* 96  BUN 5* 8 5*  CREATININE 0.58 0.85 0.88  CALCIUM 8.2* 8.2* 7.1*  MG 2.0  --   --    GFR: Estimated Creatinine Clearance: 98.5 mL/min (by C-G formula based on SCr of 0.88 mg/dL). Recent Labs  Lab 05/01/18 0341 05/04/18 1751 05/05/18 0342 05/06/18 0415 05/07/18 0357  PROCALCITON  --  <0.10  --  <0.10 <0.10  WBC 9.0 16.4* 17.1* 15.3*  --     Liver Function Tests: Recent Labs  Lab 05/04/18 1751  AST 22  ALT 14  ALKPHOS 64  BILITOT 0.7  PROT 7.6  ALBUMIN 3.0*   Recent Labs  Lab 05/04/18 1751  LIPASE 26   No results for input(s): AMMONIA in the last 168 hours.  ABG    Component Value Date/Time   PHART 7.423 05/04/2018 2217   PCO2ART 38.0 05/04/2018 2217   PO2ART 73.0 (L) 05/04/2018 2217   HCO3 24.9 05/04/2018 2217   TCO2 26 05/04/2018 2217   O2SAT 95.0 05/04/2018  2217     Coagulation Profile: No results for input(s): INR, PROTIME in the last 168 hours.  Cardiac Enzymes: No results for input(s): CKTOTAL, CKMB, CKMBINDEX, TROPONINI in the last 168 hours.  HbA1C: No results found for: HGBA1C  CBG: No results for input(s): GLUCAP in the last 168 hours.   Critical care time: n/a     Kennieth Rad, MSN, AGACNP-BC [place] Pgr: [phone] or if no answer [phone] 05/07/2018, 8:59 AM

## 2018-05-08 LAB — IGE: IgE (Immunoglobulin E), Serum: 93 IU/mL (ref 6–495)

## 2018-05-08 MED ORDER — ORAL CARE MOUTH RINSE
15.0000 mL | Freq: Two times a day (BID) | OROMUCOSAL | Status: DC
  Administered 2018-05-09 – 2018-05-10 (×2): 15 mL via OROMUCOSAL

## 2018-05-08 MED ORDER — ALUM & MAG HYDROXIDE-SIMETH 200-200-20 MG/5ML PO SUSP
15.0000 mL | Freq: Four times a day (QID) | ORAL | Status: DC | PRN
  Administered 2018-05-08 – 2018-05-09 (×2): 15 mL via ORAL
  Filled 2018-05-08 (×2): qty 30

## 2018-05-08 NOTE — Progress Notes (Addendum)
NAME:  Jenny Hendrix, MRN:  [id number], DOB:  04-Jun-1980, LOS: 4 ADMISSION DATE:  05/04/2018, CONSULTATION DATE:  05/04/18 REFERRING MD:  Dr Marlowe Sax TRH, CHIEF COMPLAINT:  Pulmonary infiltrates.   Brief History   38 year old female admitted with marked hypoxia and bilateral pulmonary infiltrates. Recent admission at [facility] for multifocal PNA 12/31 ->1/4 treated with ceftriaxone, azithroycin and supplemental O2.  Hypoxia improved and discharged home on cefdinir.  Patient noncompliant with antibiotic. Condition worsened and found to be hypoxic 1/7 in the 60-80% on room air, readmitted to [facility].  No fever, ongoing cough.  Non smoker, second hand exposure to vaping at home (denies [facility] exposure). White count 16.4.  Patient is afebrile.  RVP negative.  CTA negative for PE. Showing progression of diffuse groundglass opacities throughout both lungs since CT done 5 days ago with slight basilar sparing.  Antibiotic coverage expanded to vanc and cefepime. PCCM consulted, broaden to zosyn.  BNP 230, ESR 75, PCT neg x 2, other autoimmune labs pending. Continues to require HFNC and easily desaturates with coughing/ exertion.  1/9 remains on HFNC at 15L with coughing episodes.  Lasix 59m x 1 and high dose steroids started.   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  Patient denies familial autoimmune history- confirmed 1/10 Significant Hospital Events   [facility]   Consults:  Pulmonary 1/7 >  Procedures:    Significant Diagnostic Tests:  CTA chest 1/2 > Pneumonia involving both lungs, with upper lobe predominance. No associated pleural effusions. Mild mediastinal and RIGHT hilar lymphadenopathy, likely Reactive. RIGHT adrenal adenoma CTA chest 1/7 > Progression of diffuse ground-glass  opacities throughout both lungs since CT 5 days ago, with slight basilar sparing. Developing air bronchograms in the upper lobes. Mild mediastinal and hilar adenopathy is similar to recent prior exam, likely reactive.  TTE 1/9 >> LVEF 55-60%, no regional wall motion abnormalities; LA mildly dilated, no PFO identified, moderate TR, PAP 51 mmHg  Micro Data:  Sputum 1/7 > Flu > neg RVP 1/7 >neg  Antimicrobials:  12/31 > 1/4 Ceftriaxone/azithro 1/4 Cefdinir  Cefepime 1/7 > Vancomycin 1/7 >1/9  Interim history/subjective:  Overall, feels and breathing better per patient. Looks better.  Ongoing poor appetite with some intermittent nausea.  Coughing spells better today w/ "milky yellowish-red" productive sputum.  Rib pain only with coughing. Has remained on HFNC at 15L, less desaturation episodes with exertion/ coughing per patient. Remains febrile and hemodynamically stable Hgb trending down Still awaiting most autoimmune workup, RF mildly elevated  Unclear diuresis after lasix yesterday, specific I/Os not measured TTE w/ LVEF 55-60%, no regional wall motion abnormalities; LA mildly dilated, no PFO identified, moderate TR, PAP 51 mmHg  Objective   Blood pressure 136/86, pulse 66, temperature 98.1 F (36.7 C), temperature source Oral, resp. rate (Abnormal) 21, height '5\' 3"'  (1.6 m), weight 96.2 kg, SpO2 96 %, not currently breastfeeding.      Intake/Output Summary (Last 24 hours) at 05/08/2018 1339 Last data filed at 05/08/2018 1012 Gross per 24 hour  Intake 530 ml  Output no documentation  Net 530 ml  Oxygen has been weaned to 10 L nasal cannula.  She is anxious to ambulate. Filed Weights   05/05/18 1403  Weight: 96.2 kg   Examination: General: Is pleasant obese white female resting  in bed she is in no acute distress currently was sleeping on my arrival HEENT normocephalic atraumatic no jugular venous distention Pulmonary: Crackles bases, snoring respirations when  sleeping. Abdomen: Not tender Cardiac: Regular rate and rhythm Extremities: No edema Neuro: Awake oriented no focal deficits  Resolved Hospital Problem list     Assessment & Plan:   Acute respiratory failure with hypoxemia with upper lobe predominant nodular infiltrate    DDX large- multifocal pneumonia vs post infectious pneumonitis vs autoimmune process vs inflammatory process, Favoring acute inflammatory pneumonitis like eosinophilic pneumonia or hypersensitivity pneumonitis ;- ESR 75, LDH 275, Abs Eosinophils 0.4 on differential  - RF mildly elevated    Plan Continue to wean oxygen Agree with discontinuation of the antibiotics, has now had 5 days, all infection markers have been negative to date. Follow-up IgE level, ANA, CCP and hypersensitivity panel Continue current Solu-Medrol, changing to prednisone 50 mg daily 1/12 Repeat chest x-ray in a.m. Continuing Flonase and Protonix  follow-up ANCA titer given positive rheumatoid factor Needs to stop vaping.  Get rid of humidifier at home. If she were to decline clinically would need bronchoscopy for BAL, cell count and culture.   Remainder per primary.  PCCM will continue to follow  Best practice:  Diet: per primary Pain/Anxiety/Delirium protocol (if indicated): na VAP protocol (if indicated): na DVT prophylaxis: Lovenox GI prophylaxis: ppi Glucose control: na Mobility: per primary Code Status: FULL Family Communication: Patient updated at bedside Disposition:    Erick Colace ACNP-BC [place] Pager # [phone] OR # [phone] if no answer    Erick Colace ACNP-BC [place] Pager # [phone] OR # [phone] if no answer

## 2018-05-08 NOTE — Progress Notes (Signed)
PROGRESS NOTE    Jenny Hendrix  ZHY:[id number] DOB: 15-Oct-1980 DOA: 05/04/2018 PCP: Remi Haggard, FNP    Brief Narrative:  Jenny Hendrix is a 38 y.o. female with medical history significant of anxiety, depression, hypothyroidism presenting to the hospital for evaluation of shortness of breath.  Oxygen saturation 65% on room air in triage.  Patient was recently admitted at [facility] from 12/31 to 1/4 for acute hypoxic respiratory failure and sepsis secondary to multifocal pneumonia. Oxygen saturation 65% on room air and tachypneic.  She was placed on nonrebreather in the ED.  ABG showing pH 7.42, PCO2 38, PO2 73.  She was treated with ceftriaxone and azithromycin in the hospital and discharged home on cefdinir.  Oxygen was weaned off.   Patient states she took 3 doses of cefdinir since her hospital discharge but then lost the medication.  She started having dyspnea again which has been getting progressively worse.  Having a nonproductive cough.  She presented to her primary care office and was found to be hypoxemic and then sent to the ED. Having chest pain only when coughing; no chest pain at present.  She has been having fevers, chills, decreased p.o. intake, and fatigue.  She has been feeling nauseous and vomited.  Denies having any abdominal pain.  Reports secondhand exposure to vaping (her husband vapes).  Denies smoking cigarettes or smoking any drugs.  Denies history of asthma or any other pulmonary disease.  Subjective: Patient sleeping comfortably but noted to be snoring heavily.  She states she tends to do that when she is sick and sometimes even at baseline.  On 15 L high flow oxygen, saturating  92%. Cough improved.  Given Lasix 20 mg x 1, high-dose steroids started.  Objective: Vitals:   05/08/18 0501 05/08/18 0901 05/08/18 1012 05/08/18 1205  BP:   (!) 140/104 136/86  Pulse:  70 63 66  Resp:  (!) 21 19 (!) 21  Temp: (!) 97.5 F (36.4 C)   98.1 F (36.7 C)    TempSrc: Oral   Oral  SpO2:  97% 97% 96%  Weight:      Height:        Intake/Output Summary (Last 24 hours) at 05/08/2018 1344 Last data filed at 05/08/2018 1012 Gross per 24 hour  Intake 530 ml  Output -  Net 530 ml   Filed Weights   05/05/18 1403  Weight: 96.2 kg    Examination:  General exam: Ill-appearing female, on oxygen by nasal cannula but does not appear to be in any acute distress Respiratory system: Decreased breath sounds lower lobes, occasional rhonchi, no wheezing Cardiovascular system: S1 & S2 heard, RRR. No JVD, murmurs, rubs, gallops or clicks. No pedal edema. Gastrointestinal system: Obese female, nondistended, soft and nontender. No organomegaly or masses felt. Normal bowel sounds heard. Central nervous system: Awake oriented x3. No focal neurological deficits. Extremities: Symmetric 5 x 5 power. Skin: No rashes, lesions or ulcers Psychiatry: Judgement and insight appear normal. Mood & affect appropriate.     Data Reviewed: I have personally reviewed following labs and imaging studies  CBC: Recent Labs  Lab 05/04/18 1751 05/05/18 0342 05/06/18 0415  WBC 16.4* 17.1* 15.3*  NEUTROABS 14.0*  --   --   HGB 9.7* 9.6* 7.8*  HCT 31.7* 32.2* 26.5*  MCV 95.2 95.8 96.0  PLT 516* 420* 295*   Basic Metabolic Panel: Recent Labs  Lab 05/04/18 1751 05/06/18 0415  NA 135 136  K 3.6 3.7  CL 101 105  CO2 25 24  GLUCOSE 106* 96  BUN 8 5*  CREATININE 0.85 0.88  CALCIUM 8.2* 7.1*   GFR: Estimated Creatinine Clearance: 98.5 mL/min (by C-G formula based on SCr of 0.88 mg/dL). Liver Function Tests: Recent Labs  Lab 05/04/18 1751  AST 22  ALT 14  ALKPHOS 64  BILITOT 0.7  PROT 7.6  ALBUMIN 3.0*   Recent Labs  Lab 05/04/18 1751  LIPASE 26   No results for input(s): AMMONIA in the last 168 hours. Coagulation Profile: Recent Labs  Lab 05/07/18 1154  INR 1.13   Cardiac Enzymes: No results for input(s): CKTOTAL, CKMB, CKMBINDEX, TROPONINI in  the last 168 hours. BNP (last 3 results) No results for input(s): PROBNP in the last 8760 hours. HbA1C: No results for input(s): HGBA1C in the last 72 hours. CBG: No results for input(s): GLUCAP in the last 168 hours. Lipid Profile: No results for input(s): CHOL, HDL, LDLCALC, TRIG, CHOLHDL, LDLDIRECT in the last 72 hours. Thyroid Function Tests: No results for input(s): TSH, T4TOTAL, FREET4, T3FREE, THYROIDAB in the last 72 hours. Anemia Panel: No results for input(s): VITAMINB12, FOLATE, FERRITIN, TIBC, IRON, RETICCTPCT in the last 72 hours. Sepsis Labs: Recent Labs  Lab 05/04/18 1751 05/06/18 0415 05/07/18 0357  PROCALCITON <0.10 <0.10 <0.10    Recent Results (from the past 240 hour(s))  Culture, sputum-assessment     Status: None   Collection Time: 05/05/18  7:48 AM  Result Value Ref Range Status   Specimen Description SPUTUM  Final   Special Requests NONE  Final   Sputum evaluation   Final    THIS SPECIMEN IS ACCEPTABLE FOR SPUTUM CULTURE Performed at [facility], [place] [address] [place], [place] [address]    Report Status 05/05/2018 FINAL  Final  Respiratory Panel by PCR     Status: None   Collection Time: 05/05/18  7:48 AM  Result Value Ref Range Status   Adenovirus NOT DETECTED NOT DETECTED Final   Coronavirus 229E NOT DETECTED NOT DETECTED Final   Coronavirus HKU1 NOT DETECTED NOT DETECTED Final   Coronavirus NL63 NOT DETECTED NOT DETECTED Final   Coronavirus OC43 NOT DETECTED NOT DETECTED Final   Metapneumovirus NOT DETECTED NOT DETECTED Final   Rhinovirus / Enterovirus NOT DETECTED NOT DETECTED Final   Influenza A NOT DETECTED NOT DETECTED Final   Influenza B NOT DETECTED NOT DETECTED Final   Parainfluenza Virus 1 NOT DETECTED NOT DETECTED Final   Parainfluenza Virus 2 NOT DETECTED NOT DETECTED Final   Parainfluenza Virus 3 NOT DETECTED NOT DETECTED Final   Parainfluenza Virus 4 NOT DETECTED NOT DETECTED Final   Respiratory Syncytial Virus NOT  DETECTED NOT DETECTED Final   Bordetella pertussis NOT DETECTED NOT DETECTED Final   Chlamydophila pneumoniae NOT DETECTED NOT DETECTED Final   Mycoplasma pneumoniae NOT DETECTED NOT DETECTED Final    Comment: Performed at [facility], [place] [address], [place], [place] [address]  Culture, respiratory     Status: None   Collection Time: 05/05/18  7:48 AM  Result Value Ref Range Status   Specimen Description SPUTUM  Final   Special Requests NONE Reflexed from [id number]  Final   Gram Stain   Final    ABUNDANT WBC PRESENT, PREDOMINANTLY PMN MODERATE GRAM NEGATIVE RODS MODERATE GRAM POSITIVE COCCI    Culture   Final    Consistent with normal respiratory flora. Performed at [facility], [place] [address] [place],  [address]  Report Status 05/07/2018 FINAL  Final  MRSA PCR Screening     Status: None   Collection Time: 05/05/18  3:28 PM  Result Value Ref Range Status   MRSA by PCR NEGATIVE NEGATIVE Final    Comment:        The GeneXpert MRSA Assay (FDA approved for NASAL specimens only), is one component of a comprehensive MRSA colonization surveillance program. It is not intended to diagnose MRSA infection nor to guide or monitor treatment for MRSA infections. Performed at [facility], [place] [address] [place], [place] [address]          Radiology Studies: Dg Chest Port 1 View  Result Date: 05/07/2018 CLINICAL DATA:  Respiratory insufficiency, shortness of breath, question pneumonia EXAM: PORTABLE CHEST 1 VIEW COMPARISON:  Portable exam 0715 hours compared to 05/04/2018 FINDINGS: Enlargement of cardiac silhouette. Mediastinal contour stable. Diffuse BILATERAL airspace infiltrates increased since 05/04/2018 especially in upper lobes. No gross pleural effusion or pneumothorax. Osseous structures unremarkable. IMPRESSION: Enlargement of cardiac silhouette. Progressive BILATERAL airspace infiltrates especially in the upper lobes, question pneumonia  particularly atypical etiologies, less likely edema or pulmonary hemorrhage. Electronically Signed   By: Lavonia Dana M.D.   On: 05/07/2018 08:57        Scheduled Meds: . dextromethorphan-guaiFENesin  2 tablet Oral BID  . enoxaparin (LOVENOX) injection  40 mg Subcutaneous Q24H  . fluticasone  2 spray Each Nare Daily  . levothyroxine  50 mcg Oral QAC breakfast  . mouth rinse  15 mL Mouth Rinse BID  . methylPREDNISolone (SOLU-MEDROL) injection  125 mg Intravenous Q6H  . pantoprazole  40 mg Oral Daily  . sertraline  50 mg Oral Q lunch  . sodium chloride flush  10-40 mL Intracatheter Q12H   Continuous Infusions: . sodium chloride 10 mL/hr at 05/07/18 1900   Assessment & Plan:   Principal Problem:   Acute respiratory failure with hypoxia (HCC) Active Problems:   Anxiety and depression   GERD (gastroesophageal reflux disease)   Hypothyroidism   Multifocal pneumonia   Chronic anemia   Thrombocytosis (HCC)   Abnormal CXR   Acute hypoxic respiratory failure secondary to multifocal pneumonia - On 15 L oxygen via high flow nasal cannula.  Patient is afebrile.  Influenza panel / viral biofire negative.  CTA negative for PE. Showing progression of diffuse groundglass opacities throughout both lungs since CT.  Also showing developing air bronchograms in the upper lobes.  Findings possibly due to atypical infection or acute interstitial pneumonia.  However, there is also concern for vaping induced lung injury (secondhand exposure), and postinfectious [place].   HIV antibody negative during recent hospitalization. Patient followed by PCCM. On IV Zosyn and steroids. F/u Strep pneumo urine antigen and ANCA ordered by PCCM -Mucinex-DM scheduled and tussinex prn for cough  Chronic anemia 05/08/2018: Hemoglobin on 05/06/2018 was 1.8.  No further CBC since then.  Ordered CBC.  Continue to monitor.  Thrombocytosis -Continue to monitor CBC  Anxiety and depression -Continue home Klonopin as  needed -Continue home Zoloft  Hypothyroidism -Continue home Synthroid  GERD -Continue PPI   DVT prophylaxis: Lovenox subcutaneous Code Status: Full code Family Communication: d/w patient  Disposition Plan: home once stable     LOS: 4 days    Time spent: 46 min    Jenny Guthrie, MD [facility] Pager on amion  If 7PM-7AM, please contact night-coverage [email or web address] Password [email or web address] 05/08/2018, 1:44 PM

## 2018-05-09 ENCOUNTER — Inpatient Hospital Stay (HOSPITAL_COMMUNITY): Payer: 59

## 2018-05-09 DIAGNOSIS — R918 Other nonspecific abnormal finding of lung field: Secondary | ICD-10-CM

## 2018-05-09 LAB — CBC
HCT: 28.5 % — ABNORMAL LOW (ref 36.0–46.0)
Hemoglobin: 8.4 g/dL — ABNORMAL LOW (ref 12.0–15.0)
MCH: 28.4 pg (ref 26.0–34.0)
MCHC: 29.5 g/dL — ABNORMAL LOW (ref 30.0–36.0)
MCV: 96.3 fL (ref 80.0–100.0)
Platelets: 463 10*3/uL — ABNORMAL HIGH (ref 150–400)
RBC: 2.96 MIL/uL — ABNORMAL LOW (ref 3.87–5.11)
RDW: 13.5 % (ref 11.5–15.5)
WBC: 20.2 10*3/uL — ABNORMAL HIGH (ref 4.0–10.5)
nRBC: 0.5 % — ABNORMAL HIGH (ref 0.0–0.2)

## 2018-05-09 LAB — BASIC METABOLIC PANEL
Anion gap: 6 (ref 5–15)
BUN: 12 mg/dL (ref 6–20)
CO2: 30 mmol/L (ref 22–32)
Calcium: 8.2 mg/dL — ABNORMAL LOW (ref 8.9–10.3)
Chloride: 101 mmol/L (ref 98–111)
Creatinine, Ser: 0.69 mg/dL (ref 0.44–1.00)
GFR calc Af Amer: >60 mL/min (ref >60)
GFR calc non Af Amer: >60 mL/min (ref >60)
Glucose, Bld: 128 mg/dL — ABNORMAL HIGH (ref 70–99)
Potassium: 3.7 mmol/L (ref 3.5–5.1)
Sodium: 137 mmol/L (ref 135–145)

## 2018-05-09 LAB — C DIFFICILE QUICK SCREEN W PCR REFLEX
C DIFFICLE (CDIFF) ANTIGEN: POSITIVE — AB
C Diff toxin: NEGATIVE

## 2018-05-09 LAB — CLOSTRIDIUM DIFFICILE BY PCR, REFLEXED: Toxigenic C. Difficile by PCR: NEGATIVE

## 2018-05-09 MED ORDER — METHYLPREDNISOLONE SODIUM SUCC 40 MG IJ SOLR
40.0000 mg | Freq: Two times a day (BID) | INTRAMUSCULAR | Status: DC
  Administered 2018-05-09 – 2018-05-11 (×4): 40 mg via INTRAVENOUS
  Filled 2018-05-09 (×4): qty 1

## 2018-05-09 NOTE — Progress Notes (Signed)
NAME:  Jenny Hendrix, MRN:  [id number], DOB:  June 08, 1980, LOS: 5 ADMISSION DATE:  05/04/2018, CONSULTATION DATE:  05/04/18 REFERRING MD:  Dr Marlowe Sax TRH, CHIEF COMPLAINT:  Pulmonary infiltrates.   Brief History   38 year old female admitted with marked hypoxia and bilateral pulmonary infiltrates. Recent admission at [facility] for multifocal PNA 12/31 ->1/4 treated with ceftriaxone, azithroycin and supplemental O2.  Hypoxia improved and discharged home on cefdinir.  Patient noncompliant with antibiotic. Condition worsened and found to be hypoxic 1/7 in the 60-80% on room air, readmitted to [facility].  No fever, ongoing cough.  Non smoker, second hand exposure to vaping at home (denies [facility] exposure). White count 16.4.  Patient is afebrile.  RVP negative.  CTA negative for PE. Showing progression of diffuse groundglass opacities throughout both lungs since CT done 5 days ago with slight basilar sparing.  Antibiotic coverage expanded to vanc and cefepime. PCCM consulted, broaden to zosyn.  BNP 230, ESR 75, PCT neg x 2, other autoimmune labs pending. Continues to require HFNC and easily desaturates with coughing/ exertion.  1/9 remains on HFNC at 15L with coughing episodes.  Lasix 43m x 1 and high dose steroids started.   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  Patient denies familial autoimmune history- confirmed 1/10 Significant Hospital Events   [facility].  [facility]: High-dose steroids started 1/12: X-ray improved oxygen requirements down, now 5 L from 15.  Steroids decreased to 40 every 12 Consults:  Pulmonary 1/7 >  Procedures:    Significant Diagnostic Tests:  CTA chest 1/2 > Pneumonia involving both lungs, with upper lobe predominance. No associated pleural effusions. Mild  mediastinal and RIGHT hilar lymphadenopathy, likely Reactive. RIGHT adrenal adenoma CTA chest 1/7 > Progression of diffuse ground-glass opacities throughout both lungs since CT 5 days ago, with slight basilar sparing. Developing air bronchograms in the upper lobes. Mild mediastinal and hilar adenopathy is similar to recent prior exam, likely reactive.  TTE 1/9 >> LVEF 55-60%, no regional wall motion abnormalities; LA mildly dilated, no PFO identified, moderate TR, PAP 51 mmHg  Micro Data:  Sputum 1/7 > Flu > neg RVP 1/7 >neg  Antimicrobials:  12/31 > 1/4 Ceftriaxone/azithro 1/4 Cefdinir  Cefepime 1/7 > Vancomycin 1/7 >1/9  Interim history/subjective:  Now down to 5 L Objective   Blood pressure (Abnormal) 137/93, pulse 66, temperature 97.9 F (36.6 C), temperature source Oral, resp. rate 15, height _0  (1.6 m), weight 96.2 kg, SpO2 95 %, not currently breastfeeding.      Intake/Output Summary (Last 24 hours) at 05/09/2018 1449 Last data filed at 05/09/2018 0507 Gross per 24 hour  Intake 461.16 ml  Output 300 ml  Net 161.16 ml  Oxygen has been weaned to 10 L nasal cannula.  She is anxious to ambulate. Filed Weights   05/05/18 1403  Weight: 96.2 kg   Examination: General: Pleasant obese white female resting in bed she is in no acute distress down to 5 L this morning Pulmonary: Clear to auscultation diminished bases no accessory use Cardiac: Regular rate and rhythm without murmur rub or gallop Abdomen: Soft nontender no organomegaly GU: Clear yellow Neuro: Intact  Resolved Hospital Problem list     Assessment & Plan:   Acute respiratory failure with hypoxemia with upper lobe predominant nodular infiltrate  DDX large- multifocal pneumonia vs post infectious pneumonitis vs autoimmune process vs inflammatory process, Favoring acute inflammatory pneumonitis like eosinophilic pneumonia or hypersensitivity pneumonitis ;- ESR 75, LDH 275, Abs Eosinophils 0.4 on differential   - RF mildly elevated  Portable chest x-ray personally reviewed demonstrates some improvement in aeration  Plan Continue to wean oxygen  Change Solu-Medrol 40 every 12  X-ray a.m.  Follow-up and CEA titer Needs to stop vaping Get rid of home humidifier Deferring decision on bronchoscopy to Dr. Elsworth Soho and pulmonary attendings  Remainder per primary.  PCCM will continue to follow  Best practice:  Diet: per primary Pain/Anxiety/Delirium protocol (if indicated): na VAP protocol (if indicated): na DVT prophylaxis: Lovenox GI prophylaxis: ppi Glucose control: na Mobility: per primary Code Status: FULL Family Communication: Patient updated at bedside Disposition:    Erick Colace ACNP-BC [place] Pager # [phone] OR # [phone] if no answer

## 2018-05-09 NOTE — Progress Notes (Signed)
PROGRESS NOTE    Jenny Hendrix  TDV:[id number] DOB: Jan 26, 1981 DOA: 05/04/2018 PCP: Remi Haggard, FNP    Brief Narrative:  Jenny Hendrix is a 38 y.o. female with medical history significant of anxiety, depression, hypothyroidism presenting to the hospital for evaluation of shortness of breath.  Oxygen saturation 65% on room air in triage.  Patient was recently admitted at [facility] from 12/31 to 1/4 for acute hypoxic respiratory failure and sepsis secondary to multifocal pneumonia. Oxygen saturation 65% on room air and tachypneic.  She was placed on nonrebreather in the ED.  ABG showing pH 7.42, PCO2 38, PO2 73.  She was treated with ceftriaxone and azithromycin in the hospital and discharged home on cefdinir.  Oxygen was weaned off.   Patient states she took 3 doses of cefdinir since her hospital discharge but then lost the medication.  She started having dyspnea again which has been getting progressively worse.  Having a nonproductive cough.  She presented to her primary care office and was found to be hypoxemic and then sent to the ED. Having chest pain only when coughing; no chest pain at present.  She has been having fevers, chills, decreased p.o. intake, and fatigue.  She has been feeling nauseous and vomited.  Denies having Jenny abdominal pain.  Reports secondhand exposure to vaping (her husband vapes).  Denies smoking cigarettes or smoking Jenny drugs.  Denies history of asthma or Jenny other pulmonary disease.  Subjective: Patient currently on oxygen by nasal cannula 5 L.  She feels better today.  However, complaining of loose stools.  Denies having Jenny abdominal discomfort.  Objective: Vitals:   05/08/18 2349 05/09/18 0412 05/09/18 0501 05/09/18 0826  BP:   (!) 137/93   Pulse:   (!) 50 (!) 49  Resp:   (!) 22 16  Temp: 97.9 F (36.6 C) 97.9 F (36.6 C)    TempSrc: Oral Oral    SpO2:   (!) 83% 99%  Weight:      Height:        Intake/Output Summary (Last 24 hours)  at 05/09/2018 1307 Last data filed at 05/09/2018 0507 Gross per 24 hour  Intake 461.16 ml  Output 300 ml  Net 161.16 ml   Filed Weights   05/05/18 1403  Weight: 96.2 kg    Examination:  General exam: on oxygen by nasal cannula but does not appear to be in Jenny acute distress Respiratory system: Decreased breath sounds lower lobes, occasional rhonchi, no wheezing Cardiovascular system: S1 & S2 heard, RRR. No JVD, murmurs, rubs, gallops or clicks. No pedal edema. Gastrointestinal system: Obese female, nondistended, soft and nontender. No organomegaly or masses felt. Normal bowel sounds heard. Central nervous system: Awake oriented x3. No focal neurological deficits. Extremities: Symmetric 5 x 5 power. Skin: No rashes, lesions or ulcers Psychiatry: Judgement and insight appear normal. Mood & affect appropriate.     Data Reviewed: I have personally reviewed following labs and imaging studies  CBC: Recent Labs  Lab 05/04/18 1751 05/05/18 0342 05/06/18 0415 05/09/18 0340  WBC 16.4* 17.1* 15.3* 20.2*  NEUTROABS 14.0*  --   --   --   HGB 9.7* 9.6* 7.8* 8.4*  HCT 31.7* 32.2* 26.5* 28.5*  MCV 95.2 95.8 96.0 96.3  PLT 516* 420* 416* 062*   Basic Metabolic Panel: Recent Labs  Lab 05/04/18 1751 05/06/18 0415 05/09/18 0340  NA 135 136 137  K 3.6 3.7 3.7  CL 101 105 101  CO2 25 24  30  GLUCOSE 106* 96 128*  BUN 8 5* 12  CREATININE 0.85 0.88 0.69  CALCIUM 8.2* 7.1* 8.2*   GFR: Estimated Creatinine Clearance: 108.3 mL/min (by C-G formula based on SCr of 0.69 mg/dL). Liver Function Tests: Recent Labs  Lab 05/04/18 1751  AST 22  ALT 14  ALKPHOS 64  BILITOT 0.7  PROT 7.6  ALBUMIN 3.0*   Recent Labs  Lab 05/04/18 1751  LIPASE 26   No results for input(s): AMMONIA in the last 168 hours. Coagulation Profile: Recent Labs  Lab 05/07/18 1154  INR 1.13   Cardiac Enzymes: No results for input(s): CKTOTAL, CKMB, CKMBINDEX, TROPONINI in the last 168 hours. BNP (last 3  results) No results for input(s): PROBNP in the last 8760 hours. HbA1C: No results for input(s): HGBA1C in the last 72 hours. CBG: No results for input(s): GLUCAP in the last 168 hours. Lipid Profile: No results for input(s): CHOL, HDL, LDLCALC, TRIG, CHOLHDL, LDLDIRECT in the last 72 hours. Thyroid Function Tests: No results for input(s): TSH, T4TOTAL, FREET4, T3FREE, THYROIDAB in the last 72 hours. Anemia Panel: No results for input(s): VITAMINB12, FOLATE, FERRITIN, TIBC, IRON, RETICCTPCT in the last 72 hours. Sepsis Labs: Recent Labs  Lab 05/04/18 1751 05/06/18 0415 05/07/18 0357  PROCALCITON <0.10 <0.10 <0.10    Recent Results (from the past 240 hour(s))  Culture, sputum-assessment     Status: None   Collection Time: 05/05/18  7:48 AM  Result Value Ref Range Status   Specimen Description SPUTUM  Final   Special Requests NONE  Final   Sputum evaluation   Final    THIS SPECIMEN IS ACCEPTABLE FOR SPUTUM CULTURE Performed at [facility], [place] [address] [place], [place] [address]    Report Status 05/05/2018 FINAL  Final  Respiratory Panel by PCR     Status: None   Collection Time: 05/05/18  7:48 AM  Result Value Ref Range Status   Adenovirus NOT DETECTED NOT DETECTED Final   Coronavirus 229E NOT DETECTED NOT DETECTED Final   Coronavirus HKU1 NOT DETECTED NOT DETECTED Final   Coronavirus NL63 NOT DETECTED NOT DETECTED Final   Coronavirus OC43 NOT DETECTED NOT DETECTED Final   Metapneumovirus NOT DETECTED NOT DETECTED Final   Rhinovirus / Enterovirus NOT DETECTED NOT DETECTED Final   Influenza A NOT DETECTED NOT DETECTED Final   Influenza B NOT DETECTED NOT DETECTED Final   Parainfluenza Virus 1 NOT DETECTED NOT DETECTED Final   Parainfluenza Virus 2 NOT DETECTED NOT DETECTED Final   Parainfluenza Virus 3 NOT DETECTED NOT DETECTED Final   Parainfluenza Virus 4 NOT DETECTED NOT DETECTED Final   Respiratory Syncytial Virus NOT DETECTED NOT DETECTED Final    Bordetella pertussis NOT DETECTED NOT DETECTED Final   Chlamydophila pneumoniae NOT DETECTED NOT DETECTED Final   Mycoplasma pneumoniae NOT DETECTED NOT DETECTED Final    Comment: Performed at [facility], [place] [address] [place], [place] [address]  Culture, respiratory     Status: None   Collection Time: 05/05/18  7:48 AM  Result Value Ref Range Status   Specimen Description SPUTUM  Final   Special Requests NONE Reflexed from [id number]  Final   Gram Stain   Final    ABUNDANT WBC PRESENT, PREDOMINANTLY PMN MODERATE GRAM NEGATIVE RODS MODERATE GRAM POSITIVE COCCI    Culture   Final    Consistent with normal respiratory flora. Performed at [facility], [place] [address], [place], [place] [address]  Report Status 05/07/2018 FINAL  Final  MRSA PCR Screening     Status: None   Collection Time: 05/05/18  3:28 PM  Result Value Ref Range Status   MRSA by PCR NEGATIVE NEGATIVE Final    Comment:        The GeneXpert MRSA Assay (FDA approved for NASAL specimens only), is one component of a comprehensive MRSA colonization surveillance program. It is not intended to diagnose MRSA infection nor to guide or monitor treatment for MRSA infections. Performed at [facility], [place] [address] [place], [place] [address]          Radiology Studies: No results found.      Scheduled Meds: . dextromethorphan-guaiFENesin  2 tablet Oral BID  . enoxaparin (LOVENOX) injection  40 mg Subcutaneous Q24H  . fluticasone  2 spray Each Nare Daily  . levothyroxine  50 mcg Oral QAC breakfast  . mouth rinse  15 mL Mouth Rinse BID  . pantoprazole  40 mg Oral Daily  . sertraline  50 mg Oral Q lunch  . sodium chloride flush  10-40 mL Intracatheter Q12H   Continuous Infusions: . sodium chloride 10 mL/hr at 05/07/18 1900   Assessment & Plan:   Principal Problem:   Acute respiratory failure with hypoxia (HCC) Active Problems:   Anxiety and depression   GERD (gastroesophageal  reflux disease)   Hypothyroidism   Multifocal pneumonia   Chronic anemia   Thrombocytosis (HCC)   Abnormal CXR   Acute hypoxic respiratory failure secondary to multifocal pneumonia - On 15 L oxygen via high flow nasal cannula.  Patient is afebrile.  Influenza panel / viral biofire negative.  CTA negative for PE. Showing progression of diffuse groundglass opacities throughout both lungs since CT.  Also showing developing air bronchograms in the upper lobes.  Findings possibly due to atypical infection or acute interstitial pneumonia.  However, there is also concern for vaping induced lung injury (secondhand exposure), and postinfectious [place].   HIV antibody negative during recent hospitalization. Patient followed by PCCM. On IV Zosyn and steroids. F/u Strep pneumo urine antigen and ANCA ordered by PCCM -Mucinex-DM scheduled and tussinex prn for cough 05/09/2018: Patient states her shortness of breath is improving.  Antibiotics discontinued. -Today on 5 L oxygen nasal cannula.  Continue steroids per PCCM.  Chronic anemia 05/08/2018: Hemoglobin on 05/06/2018 was 7.8.  No further CBC since then.  Ordered CBC.  Continue to monitor. 05/09/2018: Hemoglobin 8.4.  Continue to monitor.  Thrombocytosis -Continue to monitor CBC  Anxiety and depression -Continue home Klonopin as needed -Continue home Zoloft  Hypothyroidism -Continue home Synthroid  GERD -Continue PPI  Diarrhea 05/09/2018: -Today patient complaining of diarrhea.  Since she received antibiotics she is at risk for C. difficile.  Ordered stool for C. difficile.  If negative consider Imodium.   DVT prophylaxis: Lovenox subcutaneous Code Status: Full code Family Communication: d/w patient  Disposition Plan: home once stable     LOS: 5 days    Time spent: 25 min    Yaakov Guthrie, MD [facility] Pager on amion  If 7PM-7AM, please contact night-coverage [email or web address] Password [facility] 05/09/2018, 1:07 PM

## 2018-05-10 DIAGNOSIS — R9389 Abnormal findings on diagnostic imaging of other specified body structures: Secondary | ICD-10-CM

## 2018-05-10 LAB — CBC
HCT: 30.1 % — ABNORMAL LOW (ref 36.0–46.0)
Hemoglobin: 8.9 g/dL — ABNORMAL LOW (ref 12.0–15.0)
MCH: 28.5 pg (ref 26.0–34.0)
MCHC: 29.6 g/dL — ABNORMAL LOW (ref 30.0–36.0)
MCV: 96.5 fL (ref 80.0–100.0)
PLATELETS: 519 10*3/uL — AB (ref 150–400)
RBC: 3.12 MIL/uL — ABNORMAL LOW (ref 3.87–5.11)
RDW: 13.4 % (ref 11.5–15.5)
WBC: 17.8 10*3/uL — ABNORMAL HIGH (ref 4.0–10.5)
nRBC: 0.8 % — ABNORMAL HIGH (ref 0.0–0.2)

## 2018-05-10 LAB — BASIC METABOLIC PANEL
Anion gap: 6 (ref 5–15)
BUN: 12 mg/dL (ref 6–20)
CO2: 30 mmol/L (ref 22–32)
CREATININE: 0.75 mg/dL (ref 0.44–1.00)
Calcium: 8 mg/dL — ABNORMAL LOW (ref 8.9–10.3)
Chloride: 99 mmol/L (ref 98–111)
GFR calc Af Amer: >60 mL/min (ref >60)
GFR calc non Af Amer: >60 mL/min (ref >60)
Glucose, Bld: 137 mg/dL — ABNORMAL HIGH (ref 70–99)
Potassium: 3.9 mmol/L (ref 3.5–5.1)
Sodium: 135 mmol/L (ref 135–145)

## 2018-05-10 LAB — ANTINUCLEAR ANTIBODIES, IFA: ANA Ab, IFA: NEGATIVE

## 2018-05-10 MED ORDER — ALBUTEROL SULFATE (2.5 MG/3ML) 0.083% IN NEBU: 2.5000 mg | INHALATION_SOLUTION | RESPIRATORY_TRACT | Status: DC | PRN

## 2018-05-10 MED ORDER — LOPERAMIDE HCL 2 MG PO CAPS
2.0000 mg | ORAL_CAPSULE | ORAL | Status: DC | PRN
  Administered 2018-05-10: 2 mg via ORAL
  Filled 2018-05-10: qty 1

## 2018-05-10 MED ORDER — PANTOPRAZOLE SODIUM 40 MG PO TBEC
40.0000 mg | DELAYED_RELEASE_TABLET | Freq: Every day | ORAL | Status: DC
  Administered 2018-05-10: 40 mg via ORAL
  Filled 2018-05-10: qty 1

## 2018-05-10 NOTE — Progress Notes (Addendum)
Patient BP 175/92 at 0116 and 164/102 at 0111 in left arm. MD Sofia notified. Will continue to monitor. BP reassessed at 0323: 166/80 and at 0542: 173/90. Will continue to monitor.   Informed patient hospital did not permit visitors under 36 years of age per flu protocol.

## 2018-05-10 NOTE — Progress Notes (Signed)
[place] TEAM 1 - Stepdown/ICU TEAM  Jenny Hendrix  SAY:[id number] DOB: 06-Feb-1981 DOA: 05/04/2018 PCP: Remi Haggard, FNP    Brief Narrative:  38 y.o.femalew/ a hx ofanxiety, depression, and hypothyroidism who presented w/ shortness of breath. Oxygen saturation 65% on room air in triage. Patient was recently admitted at [facility] from 12/31 to 1/4 for acute hypoxic respiratory failure and sepsis secondary to multifocal pneumonia. Patient stated she took 3 doses of cefdinir since her hospital discharge but then lost the medication.   Subjective: The patient appears to be resting comfortably in bed.  She reports that she is feeling somewhat less short of breath today.  She has not yet ambulated to a significant extent.  She denies chest pain nausea vomiting or abdominal pain.  Assessment & Plan:  Acute hypoxic respiratory failure secondary tomultifocalpneumonia Influenza panel / viral panel negative - CTA negative for PE - HIV antibody negative - ongoing care as per PCCM -making good progress with weaning of oxygen -hope to be able to liberate from oxygen prior to discharge home  Chronic anemia Hemoglobin stable with no evidence of acute blood loss  Thrombocytosis  Anxiety and depression Continue home Klonopin as needed and Zoloft -appears well compensated presently  Hypothyroidism Continue home Synthroid  GERD Continue PPI  Diarrhea C diff negative - begin imodium   DVT prophylaxis: lovenox  Code Status: FULL CODE Family Communication: Spoke with mother at bedside Disposition Plan: Stable for telemetry -ambulate in the hallway with assistance -wean to room air as able -probable discharge home 24-48 hours  Consultants:  PCCM  Antimicrobials:  Zosyn 1/7 > 1/10   Objective: Blood pressure (!) 161/95, pulse 65, temperature 97.6 F (36.4 C), temperature source Oral, resp. rate 14, height 5\' 3"  (1.6 m), weight 96.2 kg, SpO2 96 %, not currently  breastfeeding.  Intake/Output Summary (Last 24 hours) at 05/10/2018 1223 Last data filed at 05/10/2018 0542 Gross per 24 hour  Intake -  Output 1000 ml  Net -1000 ml   Filed Weights   05/05/18 1403  Weight: 96.2 kg    Examination: General: No acute respiratory distress Lungs: Fine crackles diffusely with no wheezing Cardiovascular: Regular rate and rhythm without murmur gallop or rub normal S1 and S2 Abdomen: Nontender, nondistended, soft, bowel sounds positive, no rebound, no ascites, no appreciable mass Extremities: No significant cyanosis, clubbing, or edema bilateral lower extremities  CBC: Recent Labs  Lab 05/04/18 1751  05/06/18 0415 05/09/18 0340 05/10/18 0409  WBC 16.4*   < > 15.3* 20.2* 17.8*  NEUTROABS 14.0*  --   --   --   --   HGB 9.7*   < > 7.8* 8.4* 8.9*  HCT 31.7*   < > 26.5* 28.5* 30.1*  MCV 95.2   < > 96.0 96.3 96.5  PLT 516*   < > 416* 463* 519*   < > = values in this interval not displayed.   Basic Metabolic Panel: Recent Labs  Lab 05/06/18 0415 05/09/18 0340 05/10/18 0409  NA 136 137 135  K 3.7 3.7 3.9  CL 105 101 99  CO2 24 30 30   GLUCOSE 96 128* 137*  BUN 5* 12 12  CREATININE 0.88 0.69 0.75  CALCIUM 7.1* 8.2* 8.0*   GFR: Estimated Creatinine Clearance: 108.3 mL/min (by C-G formula based on SCr of 0.75 mg/dL).  Liver Function Tests: Recent Labs  Lab 05/04/18 1751  AST 22  ALT 14  ALKPHOS 64  BILITOT 0.7  PROT 7.6  ALBUMIN 3.0*   Recent Labs  Lab 05/04/18 1751  LIPASE 26    Coagulation Profile: Recent Labs  Lab 05/07/18 1154  INR 1.13    Recent Results (from the past 240 hour(s))  Culture, sputum-assessment     Status: None   Collection Time: 05/05/18  7:48 AM  Result Value Ref Range Status   Specimen Description SPUTUM  Final   Special Requests NONE  Final   Sputum evaluation   Final    THIS SPECIMEN IS ACCEPTABLE FOR SPUTUM CULTURE Performed at [facility], [place] [address], [place], [place] [address]     Report Status 05/05/2018 FINAL  Final  Respiratory Panel by PCR     Status: None   Collection Time: 05/05/18  7:48 AM  Result Value Ref Range Status   Adenovirus NOT DETECTED NOT DETECTED Final   Coronavirus 229E NOT DETECTED NOT DETECTED Final   Coronavirus HKU1 NOT DETECTED NOT DETECTED Final   Coronavirus NL63 NOT DETECTED NOT DETECTED Final   Coronavirus OC43 NOT DETECTED NOT DETECTED Final   Metapneumovirus NOT DETECTED NOT DETECTED Final   Rhinovirus / Enterovirus NOT DETECTED NOT DETECTED Final   Influenza A NOT DETECTED NOT DETECTED Final   Influenza B NOT DETECTED NOT DETECTED Final   Parainfluenza Virus 1 NOT DETECTED NOT DETECTED Final   Parainfluenza Virus 2 NOT DETECTED NOT DETECTED Final   Parainfluenza Virus 3 NOT DETECTED NOT DETECTED Final   Parainfluenza Virus 4 NOT DETECTED NOT DETECTED Final   Respiratory Syncytial Virus NOT DETECTED NOT DETECTED Final   Bordetella pertussis NOT DETECTED NOT DETECTED Final   Chlamydophila pneumoniae NOT DETECTED NOT DETECTED Final   Mycoplasma pneumoniae NOT DETECTED NOT DETECTED Final    Comment: Performed at [facility], [place] [address], [place], [place] [address]  Culture, respiratory     Status: None   Collection Time: 05/05/18  7:48 AM  Result Value Ref Range Status   Specimen Description SPUTUM  Final   Special Requests NONE Reflexed from [id number]  Final   Gram Stain   Final    ABUNDANT WBC PRESENT, PREDOMINANTLY PMN MODERATE GRAM NEGATIVE RODS MODERATE GRAM POSITIVE COCCI    Culture   Final    Consistent with normal respiratory flora. Performed at [facility], [place] [address], [place], [place] [address]    Report Status 05/07/2018 FINAL  Final  MRSA PCR Screening     Status: None   Collection Time: 05/05/18  3:28 PM  Result Value Ref Range Status   MRSA by PCR NEGATIVE NEGATIVE Final    Comment:        The GeneXpert MRSA Assay (FDA approved for NASAL specimens only), is one component of a comprehensive  MRSA colonization surveillance program. It is not intended to diagnose MRSA infection nor to guide or monitor treatment for MRSA infections. Performed at [facility], [place] [address], [place],  [address]   C difficile quick scan w PCR reflex     Status: Abnormal   Collection Time: 05/09/18  2:20 PM  Result Value Ref Range Status   C Diff antigen POSITIVE (A) NEGATIVE Final   C Diff toxin NEGATIVE NEGATIVE Final   C Diff interpretation Results are indeterminate. See PCR results.  Final  C. Diff by PCR, Reflexed     Status: None   Collection Time: 05/09/18  2:20 PM  Result Value Ref Range Status   Toxigenic C. Difficile by PCR NEGATIVE  NEGATIVE Final    Comment: Patient is colonized with non toxigenic C. difficile. May not need treatment unless significant symptoms are present. Performed at [facility], [place] [address], [place], [place] [address]      Scheduled Meds: . dextromethorphan-guaiFENesin  2 tablet Oral BID  . enoxaparin (LOVENOX) injection  40 mg Subcutaneous Q24H  . fluticasone  2 spray Each Nare Daily  . levothyroxine  50 mcg Oral QAC breakfast  . mouth rinse  15 mL Mouth Rinse BID  . methylPREDNISolone (SOLU-MEDROL) injection  40 mg Intravenous Q12H  . pantoprazole  40 mg Oral Daily  . sertraline  50 mg Oral Q lunch  . sodium chloride flush  10-40 mL Intracatheter Q12H     LOS: 6 days   Cherene Altes, MD [facility] Office  [phone] Pager - Text Page per Amion  If 7PM-7AM, please contact night-coverage per Amion 05/10/2018, 12:23 PM

## 2018-05-10 NOTE — Progress Notes (Addendum)
NAME:  Jenny Hendrix, MRN:  [id number], DOB:  Jun 26, 1980, LOS: 6 ADMISSION DATE:  05/04/2018, CONSULTATION DATE:  05/04/18 REFERRING MD:  Dr Marlowe Sax TRH, CHIEF COMPLAINT:  Pulmonary infiltrates.   Brief History   38 year old female admitted with marked hypoxia and bilateral pulmonary infiltrates. Recent admission at [facility] for multifocal PNA 12/31 ->1/4 treated with ceftriaxone, azithroycin and supplemental O2.  Hypoxia improved and discharged home on cefdinir.  Patient noncompliant with antibiotic. Condition worsened and found to be hypoxic 1/7 in the 60-80% on room air, readmitted to [facility].  No fever, ongoing cough.  Non smoker, second hand exposure to vaping at home (denies [facility] exposure). White count 16.4.  Patient is afebrile.  RVP negative.  CTA negative for PE. Showing progression of diffuse groundglass opacities throughout both lungs since CT done 5 days ago with slight basilar sparing.  Antibiotic coverage expanded to vanc and cefepime. PCCM consulted, broaden to zosyn.  BNP 230, ESR 75, PCT neg x 2, other autoimmune labs pending. Continues to require HFNC and easily desaturates with coughing/ exertion.  1/9 remains on HFNC at 15L with coughing episodes.  Lasix 50m x 1 and high dose steroids started.  1/12: X-ray improved oxygen requirements down, now 5 L from 15.  Steroids decreased to 40 every 12  Past Medical History   has a past medical history of Anemia, Anxiety, ASCUS of cervix with negative high risk HPV (05/22/2016), Depression, HSV infection, Hypothyroid, Hypothyroidism, Hypoxia (04/2018), Ovarian cyst, and Pneumonia.  Patient denies familial autoimmune history- confirmed 1/10 Significant Hospital Events   [facility].  1/9: High-dose steroids started 1/12: X-ray improved oxygen requirements down, now 5 L from 15.  Steroids decreased to 40 every 12  Consults:  Pulmonary 1/7 >  Procedures:    Significant Diagnostic Tests:  CTA chest 1/2 > Pneumonia  involving both lungs, with upper lobe predominance. No associated pleural effusions. Mild mediastinal and RIGHT hilar lymphadenopathy, likely Reactive. RIGHT adrenal adenoma CTA chest 1/7 > Progression of diffuse ground-glass opacities throughout both lungs since CT 5 days ago, with slight basilar sparing. Developing air bronchograms in the upper lobes. Mild mediastinal and hilar adenopathy is similar to recent prior exam, likely reactive.  TTE 1/9 >> LVEF 55-60%, no regional wall motion abnormalities; LA mildly dilated, no PFO identified, moderate TR, PAP 51 mmHg  Micro Data:  Sputum 1/7 > Flu > neg RVP 1/7 >neg  Antimicrobials:  12/31 > 1/4 Ceftriaxone/azithro 1/4 Cefdinir  Cefepime 1/7 > Vancomycin 1/7 >1/9  Interim history/subjective:  No issues Remains on 4L O2, some question of desaturation during the night, but patient thinks she is taking it off accidentally in her sleep.   Objective   Blood pressure (!) 173/90, pulse (!) 47, temperature 97.7 F (36.5 C), temperature source Oral, resp. rate 17, height '5\' 3"'  (1.6 m), weight 96.2 kg, SpO2 98 %, not currently breastfeeding.      Intake/Output Summary (Last 24 hours) at 05/10/2018 0942 Last data filed at 05/10/2018 0542 Gross per 24 hour  Intake -  Output 1000 ml  Net -1000 ml  Oxygen has been weaned to 10 L nasal cannula.  She is anxious to ambulate. Filed Weights   05/05/18 1403  Weight: 96.2 kg   Examination: General: Pleasant adult female sitting in bed in NAD HEENT: MM pink/moist Neuro: Awake, non focal  CV:  rrr, no m/r/g PULM: even/non-labored, no wheeze, faint bibasilar rales otherwise clear anteriorly, taken to  2L [place] on my exam doing well ~ 94-95% DP:TELM, non-tender, bs active  Extremities: warm/dry, no edema  Skin: no rashes  Resolved Hospital Problem list     Assessment & Plan:   Acute respiratory failure with hypoxemia with upper lobe predominant nodular infiltrate  DDX large-post infectious  pneumonitis vs autoimmune process vs inflammatory process - Favoring acute inflammatory pneumonitis - ESR 75, LDH 275, Abs Eosinophils 0.4 on differential, IgE 93 - RF mildly elevated 14.6  Plan Continue to wean oxygen for goal sats >92%, otherwise will need to consider home O2 Await HSP, ANA, and ANCA titers  Continue Solu-Medrol 40 every 12 hrs, will change to oral 1/14 and will need a slow taper Get rid of home humidifier Defer bronchoscopy as patient has made significant improvement in O2 requirements and improvement on CXR Patient will need pulmonary follow-up as outpatient  Remainder per primary.  PCCM will continue to follow  Best practice:  Diet: per primary Pain/Anxiety/Delirium protocol (if indicated): na VAP protocol (if indicated): na DVT prophylaxis: Lovenox GI prophylaxis: ppi Glucose control: na Mobility: per primary Code Status: FULL Family Communication: Patient updated at bedside Disposition: per primary   Kennieth Rad, MSN, AGACNP-BC [place] Pgr: [phone] or if no answer [phone] 05/10/2018, 9:42 AM  Attending Note:  38 year old female with acute hypoxemic respiratory failure due to pneumonia and concern for an acute pneumonitis.  Patient has responded well to steroids and is now down to 2L [place].  On exam, bibasilar crackles noted.  I reviewed CXR myself, infiltrate noted.  Discussed with PCCM-NP.    Acute hypoxemic respiratory failure:  - Titrate O2 for sat of 88-92%  - Keep as dry as able  Acute pneumonitis: RF positive with ESR elevated  - Continue IV solumedrol for now  - Change to PO prednisone 40 mg PO daily with a very slow taper  - Will likely need a bronch when more stable likely as outpatient  HCAP: monitor off abx  Hypoxemia:  - Will likely need to go home on home O2  - Ambulatory desat study prior to discharge for home O2  PCCM will continue to follow.  Patient seen and examined, agree with above note.  I  dictated the care and orders written for this patient under my direction.  Rush Farmer, [place]

## 2018-05-11 DIAGNOSIS — J189 Pneumonia, unspecified organism: Secondary | ICD-10-CM

## 2018-05-11 LAB — ANCA TITERS
Atypical P-ANCA titer: <1:20 {titer}
C-ANCA: <1:20 {titer}
P-ANCA: <1:20 {titer}

## 2018-05-11 MED ORDER — IPRATROPIUM-ALBUTEROL 0.5-2.5 (3) MG/3ML IN SOLN: RESPIRATORY_TRACT | 0 refills | Status: DC

## 2018-05-11 MED ORDER — AMLODIPINE BESYLATE 10 MG PO TABS
10.0000 mg | ORAL_TABLET | Freq: Every day | ORAL | Status: DC
  Administered 2018-05-11: 10 mg via ORAL
  Filled 2018-05-11: qty 1

## 2018-05-11 MED ORDER — AMLODIPINE BESYLATE 10 MG PO TABS: 10.0000 mg | ORAL_TABLET | Freq: Every day | ORAL | 0 refills | Status: AC

## 2018-05-11 MED ORDER — METHYLPREDNISOLONE 4 MG PO TBPK: ORAL_TABLET | ORAL | 0 refills | Status: DC

## 2018-05-11 NOTE — Progress Notes (Signed)
NAME:  Jenny Hendrix, MRN:  [id number], DOB:  09-21-1980, LOS: 7 ADMISSION DATE:  05/04/2018, CONSULTATION DATE:  05/04/18 REFERRING MD:  Dr Marlowe Sax TRH, CHIEF COMPLAINT:  Pulmonary infiltrates.   Brief History   38 year old female admitted with marked hypoxia and bilateral pulmonary infiltrates. Recent admission at [facility] for multifocal PNA 12/31 ->1/4 treated with ceftriaxone, azithroycin and supplemental O2.  Hypoxia improved and discharged home on cefdinir.  Patient noncompliant with antibiotic. Condition worsened and found to be hypoxic 1/7 in the 60-80% on room air, readmitted to [facility].  No fever, ongoing cough.  Non smoker, second hand exposure to vaping at home (denies [facility] exposure). White count 16.4.  Patient is afebrile.  RVP negative.  CTA negative for PE. Showing progression of diffuse groundglass opacities throughout both lungs since CT done 5 days ago with slight basilar sparing.  Antibiotic coverage expanded to vanc and cefepime. PCCM consulted, broaden to zosyn.  BNP 230, ESR 75, PCT neg x 2, other autoimmune labs pending. Continues to require HFNC and easily desaturates with coughing/ exertion.  1/9 remains on HFNC at 15L with coughing episodes.  Lasix 67m x 1 and high dose steroids started.  1/12: X-ray improved oxygen requirements down, now 5 L from 15.  Steroids decreased to 40 every 12  Past Medical History   has a past medical history of Anemia, Anxiety, ASCUS of cervix with negative high risk HPV (05/22/2016), Depression, HSV infection, Hypothyroid, Hypothyroidism, Hypoxia (04/2018), Ovarian cyst, and Pneumonia.  Patient denies familial autoimmune history- confirmed 1/10  Significant Hospital Events   [facility].  1/9: High-dose steroids started 1/12: X-ray improved oxygen requirements down, now 5 L from 15.  Steroids decreased to 40 every 12  Consults:  Pulmonary 1/7 >  Procedures:    Significant Diagnostic Tests:  CTA chest 1/2 >  Pneumonia involving both lungs, with upper lobe predominance. No associated pleural effusions. Mild mediastinal and RIGHT hilar lymphadenopathy, likely Reactive. RIGHT adrenal adenoma CTA chest 1/7 > Progression of diffuse ground-glass opacities throughout both lungs since CT 5 days ago, with slight basilar sparing. Developing air bronchograms in the upper lobes. Mild mediastinal and hilar adenopathy is similar to recent prior exam, likely reactive.  TTE 1/9 >> LVEF 55-60%, no regional wall motion abnormalities; LA mildly dilated, no PFO identified, moderate TR, PAP 51 mmHg  Micro Data:  Sputum 1/7 > Flu > neg RVP 1/7 >neg  Antimicrobials:  12/31 > 1/4 Ceftriaxone/azithro 1/4 Cefdinir  Cefepime 1/7 > Vancomycin 1/7 >1/9  Interim history/subjective:  No events overnight, down to RA at sat of 93%  Objective   Blood pressure (!) 151/85, pulse 68, temperature (!) 97.3 F (36.3 C), temperature source Oral, resp. rate 14, height '5\' 3"'  (1.6 m), weight 96.2 kg, SpO2 97 %, not currently breastfeeding.     No intake or output data in the 24 hours ending 05/11/18 1530Oxygen has been weaned to 10 L nasal cannula.  She is anxious to ambulate. Filed Weights   05/05/18 1403  Weight: 96.2 kg   Examination: General: Pleasant female, NAD, well appearing HEENT: [place]/AT, PERRL, EOM-I and MMM Neuro: Awake and interactive, moving all ext to command CV:  RRR, Nl S1/S2 and -M/R/G PULM: Bibasilar crackles GI: Soft, NT, ND and +BS Extremities: -edema and -tenderness Skin: no rashes  Resolved Hospital Problem list     Assessment & Plan:   38year old female with acute hypoxemic respiratory failure due to  pulmonary edema and likely inflammation.  I reviewed CXR myself, improving infiltrate noted.  Discussed with PCCM-NP.  Acute hypoxemic respiratory failure:  - Titrate O2 to off  - Steroids as below  Acute pneumonitis: RF positive with ESR elevated  - Prednisone 40 mg PO daily with a very slow  taper  - No need for bronch at this point, can be seen and decided as outpatient  HCAP: Monitor off abx  Apt made as outpatient to be evaluated by PCCM.  PCCM will sign off, please call back if needed.  Rush Farmer, M.D. [facility] Pulmonary/Critical Care Medicine. Pager: [phone]. After hours pager: [phone].

## 2018-05-11 NOTE — Progress Notes (Signed)
Jenny Hendrix to be D/C' Home by MD order.  Discussed with the patient and all questions fully answered.  VSS, Skin clean, dry and intact without evidence of skin break down, no evidence of skin tears noted. IV catheter discontinued intact. Site without signs and symptoms of complications. Dressing and pressure applied.  An After Visit Summary was printed and given to the patient. Patient received prescription.  D/c education completed with patient/family including follow up instructions, medication list, d/c activities limitations if indicated, with other d/c instructions as indicated by MD - patient able to verbalize understanding, all questions fully answered.   Patient instructed to return to ED, call 911, or call MD for any changes in condition.   Patient escorted via [place], and D/C home via private auto.   Portable oxygen tank and nebulizer given to pt before D/C home.   Lorenza Evangelist [facility] 05/11/2018 12:49 PM

## 2018-05-11 NOTE — Care Management Note (Signed)
Case Management Note  Patient Details  Name: Jenny Hendrix MRN: [id number] Date of Birth: [birth year]  Subjective/Objective:               Acute respiratory failure with hypoxia, hx of anxiety, depression, and hypothyroidism   Resides with family.  Aviva Kluver (Spouse) Karen Kitchens (Mother)    [phone]     PCP: Threasa Alpha  Action/Plan: Transition to home. Referral made for home oxygen. Portable oxygen tank and nebulizer will be provided for transportation to home prior to discharge pending insurance authorization.   Pt states mom to provide transportation to home.  Expected Discharge Date:  05/11/18               Expected Discharge Plan:  Home/Self Care  In-House Referral:  NA  Discharge planning Services  CM Consult  Post Acute Care Choice:  NA Choice offered to:  Patient  DME Arranged:  Oxygen, Nebulizer machine, Nebulizer/meds DME Agency:  [place]:  NA [place] Agency:  NA  Status of Service:  Completed, signed off  If discussed at [place] of Stay Meetings, dates discussed:    Additional Comments:  Sharin Mons, RN 05/11/2018, 10:07 AM

## 2018-05-11 NOTE — Discharge Summary (Signed)
Jenny Hendrix MHD:[id number] DOB: 07/23/1980 DOA: 05/04/2018  PCP: Remi Haggard, FNP  Admit date: 05/04/2018  Discharge date: 05/11/2018  Admitted From: Home   Disposition:  Home   Recommendations for Outpatient Follow-up:   Follow up with PCP in 1-2 weeks  PCP Please obtain BMP/CBC, 2 view CXR in 1week,  (see Discharge instructions)   PCP Please follow up on the following pending results:    Home Health: None   Equipment/Devices: o2 Consultations: PCCM Discharge Condition: Stable   CODE STATUS: Full   Diet Recommendation: Heart Healthy     Chief Complaint  Patient presents with  . Shortness of Breath     Brief history of present illness from the day of admission and additional interim summary    38 y.o.femalew/ a hx ofanxiety, depression, and hypothyroidism who presented w/ shortness of breath. Oxygen saturation 65% on room air in triage. Patient was recently admitted at [facility] from 12/31 to 1/4 for acute hypoxic respiratory failure and sepsis secondary to multifocal pneumonia. Patient stated she took 3 doses of cefdinir since her hospital discharge but then lost the medication.                                                                  Hospital Course    Cute hypoxic respiratory failure due to community-acquired pneumonia.  CT angiogram negative, HIV antibody negative, negative influenza and viral panel.  Initially required broad-spectrum IV Zosyn under the care of pulmonary critical care, she also required IV steroids for possible reactive airway disease and possible some interstitial reactive phenomenon.  She is now close to baseline, nontoxic, has completed her antibiotic course, has been transitioned to oral prednisone, at rest she is about 94% on room air upon ambulation she  does qualify for home oxygen but I think this is more related to her underlying morbid obesity than her lung disease.  For now since she had acute lung injury will give her home oxygen and have her follow with PCP.  Oral prednisone taper, if continues to be hypoxic after a few weeks outpatient pulmonary follow-up is recommended.   Diarrhea.  Antibiotic induced.  C. difficile toxin negative, resolved with supportive care.  Underlying anxiety, depression, chronic anemia and GERD.  Home medications continued.   Discharge diagnosis     Principal Problem:   Acute respiratory failure with hypoxia (HCC) Active Problems:   Anxiety and depression   GERD (gastroesophageal reflux disease)   Hypothyroidism   Pneumonitis   Chronic anemia   Thrombocytosis (HCC)   Abnormal CXR   Pulmonary infiltrates    Discharge instructions    Discharge Instructions    Diet - low sodium heart healthy   Complete by:  As directed    Discharge instructions   Complete by:  As directed    Follow with Primary MD Remi Haggard, FNP in 7 days   Get CBC, CMP, 2 view Chest X ray -  checked  by Primary MD  in 5-7 days   Activity: As tolerated with Full fall precautions use walker/cane & assistance as needed  Disposition Home    Diet: Heart Healthy    Special Instructions: If you have smoked or chewed Tobacco  in the last 2 yrs please stop smoking, stop any regular Alcohol  and or any Recreational drug use.  On your next visit with your primary care physician please Get Medicines reviewed and adjusted.  Please request your Prim.MD to go over all Hospital Tests and Procedure/Radiological results at the follow up, please get all Hospital records sent to your Prim MD by signing hospital release before you go home.  If you experience worsening of your admission symptoms, develop shortness of breath, life threatening emergency, suicidal or homicidal thoughts you must seek medical attention immediately by calling  911 or calling your MD immediately  if symptoms less severe.  You Must read complete instructions/literature along with all the possible adverse reactions/side effects for all the Medicines you take and that have been prescribed to you. Take any new Medicines after you have completely understood and accpet all the possible adverse reactions/side effects.   Increase activity slowly   Complete by:  As directed       Discharge Medications   Allergies as of 05/11/2018      Reactions   Azithromycin Nausea And Vomiting   Vomits if received via IV   Benzonatate Diarrhea   Metronidazole Other (See Comments)   Body aches, all-over side effects    Latex Rash   NO POWDERED GLOVES!!   Tape Rash   No Band-Aids!!      Medication List    STOP taking these medications   cefdinir 300 MG capsule Commonly known as:  OMNICEF     TAKE these medications   amLODipine 10 MG tablet Commonly known as:  NORVASC Take 1 tablet (10 mg total) by mouth daily.   clonazePAM 1 MG tablet Commonly known as:  KLONOPIN Take 1 mg by mouth 3 (three) times daily as needed for anxiety.   ipratropium-albuterol 0.5-2.5 (3) MG/3ML Soln Commonly known as:  DUONEB Use twice a day scheduled and every 4 hours as needed for shortness of breath and wheezing   levonorgestrel 20 MCG/24HR IUD Commonly known as:  MIRENA 1 Intra Uterine Device (1 each total) by Intrauterine route once for 1 dose.   levothyroxine 50 MCG tablet Commonly known as:  SYNTHROID Take 1 tablet (50 mcg total) by mouth daily before breakfast.   methylPREDNISolone 4 MG Tbpk tablet Commonly known as:  MEDROL DOSEPAK follow package directions   omeprazole 40 MG capsule Commonly known as:  PRILOSEC Take 40 mg by mouth at bedtime.   PROAIR HFA 108 (90 Base) MCG/ACT inhaler Generic drug:  albuterol Inhale 1-2 puffs into the lungs every 6 (six) hours as needed for wheezing or shortness of breath.   sertraline 100 MG tablet Commonly known as:   ZOLOFT Take 1 tablet (100 mg total) by mouth daily. What changed:    how much to take  when to take this            Durable Medical Equipment  (From admission, onward)         Start     Ordered   05/11/18 0939  For home  use only DME Nebulizer/meds  Once    Question:  Patient needs a nebulizer to treat with the following condition  Answer:  Hypoxia   05/11/18 0938   05/11/18 0939  For home use only DME oxygen  Once    Question Answer Comment  Mode or (Route) Nasal cannula   Liters per Minute 2   Frequency Continuous (stationary and portable oxygen unit needed)   Oxygen conserving device Yes   Oxygen delivery system Gas      05/11/18 0938          Follow-up Information    Remi Haggard, FNP. Schedule an appointment as soon as possible for a visit in 1 week(s).   Specialty:  Family Medicine Contact information: [address] [place] [address] [phone]           Major procedures and Radiology Reports - PLEASE review detailed and final reports thoroughly  -        Dg Chest 2 View  Result Date: 04/27/2018 CLINICAL DATA:  Fever EXAM: CHEST - 2 VIEW COMPARISON:  06/11/2015 report FINDINGS: Diffuse bilateral interstitial and ground-glass opacity. No pleural effusion. Normal heart size. No pneumothorax. IMPRESSION: Diffuse interstitial and ground-glass opacity bilaterally, may reflect diffuse pneumonia given history of fever. Electronically Signed   By: Donavan Foil M.D.   On: 04/27/2018 22:59   Ct Angio Chest Pe W And/or Wo Contrast  Result Date: 05/04/2018 CLINICAL DATA:  Chest pain, complex, intermediate/high prob of ACS/PE/AAS. Cough and shortness of breath. EXAM: CT ANGIOGRAPHY CHEST WITH CONTRAST TECHNIQUE: Multidetector CT imaging of the chest was performed using the standard protocol during bolus administration of intravenous contrast. Multiplanar CT image reconstructions and MIPs were obtained to evaluate the vascular anatomy. CONTRAST:   47mL ISOVUE-370 IOPAMIDOL (ISOVUE-370) INJECTION 76% COMPARISON:  Chest radiograph earlier this day. Chest CT 5 days prior 04/29/2018 at [facility]. FINDINGS: Cardiovascular: There are no filling defects within the pulmonary arteries to suggest pulmonary embolus. Ascending aorta measures 3.5 cm on the current exam, cardiac motion artifact limits more detailed assessment. No aortic dissection. Heart is normal in size. No pericardial effusion. Mediastinum/Nodes: Mild mediastinal and right greater than left hilar adenopathy, index node in the right paratracheal region measures 1.5 cm short axis. Findings are similar to recent prior. Esophagus is decompressed. No visualized thyroid nodule. Lungs/Pleura: Progression in diffuse ground-glass opacities throughout both lungs, with slight basilar sparing. Developing air bronchograms in the upper lobe centrally. No definite septal thickening. No pleural fluid. No pneumothorax. Trachea and mainstem bronchi are patent. Upper Abdomen: Suggestion of hepatosplenomegaly, partially included. No acute findings. Musculoskeletal: There are no acute or suspicious osseous abnormalities. Review of the MIP images confirms the above findings. IMPRESSION: 1. No pulmonary embolus. 2. Progression of diffuse ground-glass opacities throughout both lungs since CT 5 days ago, with slight basilar sparing. Developing air bronchograms in the upper lobes. Differential considerations are broad and findings may be secondary to atypical infection, acute interstitial pneumonia, ARDS, or pulmonary hemorrhage. Atypical pulmonary edema is also considered. 3. Mild mediastinal and hilar adenopathy is similar to recent prior exam, likely reactive. 4. Ectatic ascending thoracic aorta on prior exam is not as well demonstrated on the current exam, motion artifact obscured. Electronically Signed   By: Keith Rake M.D.   On: 05/04/2018 20:08   Ct Angio Chest Pe W Or Wo Contrast  Result Date:  04/29/2018 CLINICAL DATA:  38 year old who presented 2 days ago with hypoxemia and shortness of breath and  is being treated for pneumonia. Persistent hypoxia when taken off of oxygen therapy. EXAM: CT ANGIOGRAPHY CHEST WITH CONTRAST TECHNIQUE: Multidetector CT imaging of the chest was performed using the standard protocol during bolus administration of intravenous contrast. Multiplanar CT image reconstructions and MIPs were obtained to evaluate the vascular anatomy. CONTRAST:  108mL OMNIPAQUE IOHEXOL 350 MG/ML IV. COMPARISON:  No prior CT. Chest x-ray 04/27/2018. FINDINGS: Cardiovascular: Contrast opacification of the pulmonary arteries is good. No filling defects within either main pulmonary artery or their segmental branches in either lung to suggest pulmonary embolism. Heart size upper normal. No visible coronary atherosclerosis. No pericardial effusion. Mild ectasia of the ascending thoracic aorta measuring up to 4.0 cm, though this measurement may not be accurate due to the significant motion of the hyperdynamic heart. No visible atherosclerosis involving the thoracic or proximal abdominal aorta or their visible branches. Mediastinum/Nodes: Upper normal sized to mildly enlarged mediastinal and RIGHT hilar lymph nodes. Index RIGHT UPPER paratracheal (station 2R) mediastinal node measures approximately 2.1 x 1.9 cm. Index RIGHT hilar node measures approximately 1.7 x 2.1 cm. Normal-sized lymph nodes elsewhere in the mediastinum and LEFT hilum. No axillary lymphadenopathy. Normal appearing esophagus. Normal-appearing thyroid gland. Lungs/Pleura: Airspace opacities throughout both lungs, with upper lobe predominance. No associated pleural effusions. Central airways patent without significant bronchial wall thickening. Upper Abdomen: Possible hepatosplenomegaly, though the liver and spleen are incompletely imaged. Approximate 2.0 x 1.3 cm low-attenuation nodule involving the RIGHT adrenal gland with Hounsfield  measurement of 0. Visualized upper abdomen otherwise unremarkable for the early arterial phase of enhancement. Musculoskeletal: Mild thoracic spondylosis. No acute findings. Review of the MIP images confirms the above findings. IMPRESSION: 1. No evidence of pulmonary embolism. 2. Pneumonia involving both lungs, with upper lobe predominance. No associated pleural effusions. 3. Mild mediastinal and RIGHT hilar lymphadenopathy, likely reactive. 4. 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 Recommend annual imaging followup by CTA or MRA. This recommendation follows 2010 ACCF/AHA/AATS/ACR/ASA/SCA/SCAI/SIR/STS/SVM Guidelines for the Diagnosis and Management of Patients with Thoracic Aortic Disease. 2010; 121: C488-Q916. 5. Possible hepatosplenomegaly, though the liver and spleen are incompletely imaged. 6. RIGHT adrenal adenoma. Electronically Signed   By: Evangeline Dakin M.D.   On: 04/29/2018 13:26   Dg Chest Port 1 View  Result Date: 05/09/2018 CLINICAL DATA:  Pneumonitis. EXAM: PORTABLE CHEST 1 VIEW COMPARISON:  Radiographs 05/07/2018 and 05/04/2018.  CT 05/04/2018. FINDINGS: 0721 hours. Interval partial clearing of the diffuse bilateral airspace opacities. The heart size and mediastinal contours are stable. There is no pneumothorax or significant pleural effusion. The bones appear unchanged. Telemetry leads overlie the chest. IMPRESSION: Interval partial clearing of bilateral airspace opacities consistent with improving pneumonitis. No new findings. Electronically Signed   By: Richardean Sale M.D.   On: 05/09/2018 16:03   Dg Chest Port 1 View  Result Date: 05/07/2018 CLINICAL DATA:  Respiratory insufficiency, shortness of breath, question pneumonia EXAM: PORTABLE CHEST 1 VIEW COMPARISON:  Portable exam 0715 hours compared to 05/04/2018 FINDINGS: Enlargement of cardiac silhouette. Mediastinal contour stable. Diffuse BILATERAL airspace infiltrates increased since 05/04/2018  especially in upper lobes. No gross pleural effusion or pneumothorax. Osseous structures unremarkable. IMPRESSION: Enlargement of cardiac silhouette. Progressive BILATERAL airspace infiltrates especially in the upper lobes, question pneumonia particularly atypical etiologies, less likely edema or pulmonary hemorrhage. Electronically Signed   By: Lavonia Dana M.D.   On: 05/07/2018 08:57   Dg Chest Portable 1 View  Result Date: 05/04/2018 CLINICAL DATA:  Cough and shortness  of breath for 2 weeks. Recent pneumonia. EXAM: PORTABLE CHEST 1 VIEW COMPARISON:  06/25/2004 FINDINGS: Heart size is within normal limits. Mild diffuse heterogeneous airspace disease is seen, which may be due to edema, atypical infection, or less likely hemorrhage. No evidence of pneumothorax or pleural effusion. IMPRESSION: Mild diffuse heterogeneous bilateral airspace disease. Differential diagnosis includes edema, atypical infection, or less likely hemorrhage. Electronically Signed   By: Earle Gell M.D.   On: 05/04/2018 18:04    Micro Results     Recent Results (from the past 240 hour(s))  Culture, sputum-assessment     Status: None   Collection Time: 05/05/18  7:48 AM  Result Value Ref Range Status   Specimen Description SPUTUM  Final   Special Requests NONE  Final   Sputum evaluation   Final    THIS SPECIMEN IS ACCEPTABLE FOR SPUTUM CULTURE Performed at [facility], [address], [place], [place] [address]    Report Status 05/05/2018 FINAL  Final  Respiratory Panel by PCR     Status: None   Collection Time: 05/05/18  7:48 AM  Result Value Ref Range Status   Adenovirus NOT DETECTED NOT DETECTED Final   Coronavirus 229E NOT DETECTED NOT DETECTED Final   Coronavirus HKU1 NOT DETECTED NOT DETECTED Final   Coronavirus NL63 NOT DETECTED NOT DETECTED Final   Coronavirus OC43 NOT DETECTED NOT DETECTED Final   Metapneumovirus NOT DETECTED NOT DETECTED Final   Rhinovirus / Enterovirus NOT DETECTED NOT DETECTED Final    Influenza A NOT DETECTED NOT DETECTED Final   Influenza B NOT DETECTED NOT DETECTED Final   Parainfluenza Virus 1 NOT DETECTED NOT DETECTED Final   Parainfluenza Virus 2 NOT DETECTED NOT DETECTED Final   Parainfluenza Virus 3 NOT DETECTED NOT DETECTED Final   Parainfluenza Virus 4 NOT DETECTED NOT DETECTED Final   Respiratory Syncytial Virus NOT DETECTED NOT DETECTED Final   Bordetella pertussis NOT DETECTED NOT DETECTED Final   Chlamydophila pneumoniae NOT DETECTED NOT DETECTED Final   Mycoplasma pneumoniae NOT DETECTED NOT DETECTED Final    Comment: Performed at [facility], [place] [address] [place], [place] [address]  Culture, respiratory     Status: None   Collection Time: 05/05/18  7:48 AM  Result Value Ref Range Status   Specimen Description SPUTUM  Final   Special Requests NONE Reflexed from [id number]  Final   Gram Stain   Final    ABUNDANT WBC PRESENT, PREDOMINANTLY PMN MODERATE GRAM NEGATIVE RODS MODERATE GRAM POSITIVE COCCI    Culture   Final    Consistent with normal respiratory flora. Performed at [facility], [place] [address] [place], [place] [address]    Report Status 05/07/2018 FINAL  Final  MRSA PCR Screening     Status: None   Collection Time: 05/05/18  3:28 PM  Result Value Ref Range Status   MRSA by PCR NEGATIVE NEGATIVE Final    Comment:        The GeneXpert MRSA Assay (FDA approved for NASAL specimens only), is one component of a comprehensive MRSA colonization surveillance program. It is not intended to diagnose MRSA infection nor to guide or monitor treatment for MRSA infections. Performed at [facility], [place] [address] [place], [place] [address]   C difficile quick scan w PCR reflex     Status: Abnormal   Collection Time: 05/09/18  2:20 PM  Result Value Ref Range Status   C Diff antigen POSITIVE (A)  NEGATIVE Final   C Diff toxin NEGATIVE NEGATIVE Final   C Diff interpretation Results are indeterminate. See PCR results.  Final    C. Diff by PCR, Reflexed     Status: None   Collection Time: 05/09/18  2:20 PM  Result Value Ref Range Status   Toxigenic C. Difficile by PCR NEGATIVE NEGATIVE Final    Comment: Patient is colonized with non toxigenic C. difficile. May not need treatment unless significant symptoms are present. Performed at [facility], [place] [address], [place], [place] [address]     Today   Subjective    Anaissa Macfadden today has no headache,no chest abdominal pain,no new weakness tingling or numbness, feels much better wants to go home today.    Objective   Blood pressure (!) 168/93, pulse (!) 50, temperature (!) 97.3 F (36.3 C), temperature source Oral, resp. rate 14, height 5\' 3"  (1.6 m), weight 96.2 kg, SpO2 97 %, not currently breastfeeding.  No intake or output data in the 24 hours ending 05/11/18 0943  Exam  Awake Alert, Oriented x 3, No new F.N deficits, Normal affect [place].AT,PERRAL Supple Neck,No JVD, No cervical lymphadenopathy appriciated.  Symmetrical Chest wall movement, Good air movement bilaterally, CTAB RRR,No Gallops,Rubs or new Murmurs, No Parasternal Heave +ve B.Sounds, Abd Soft, Non tender, No organomegaly appriciated, No rebound -guarding or rigidity. No Cyanosis, Clubbing or edema, No new Rash or bruise    Data Review   CBC w Diff:  Lab Results  Component Value Date   WBC 17.8 (H) 05/10/2018   HGB 8.9 (L) 05/10/2018   HGB 11.9 09/17/2017   HGB 13.2 05/22/2016   HCT 30.1 (L) 05/10/2018   HCT 36.5 09/17/2017   HCT 40 05/22/2016   PLT 519 (H) 05/10/2018   PLT 513 (H) 09/17/2017   PLT 355 05/22/2016   LYMPHOPCT 7 05/04/2018   MONOPCT 4 05/04/2018   EOSPCT 2 05/04/2018   BASOPCT 0 05/04/2018    CMP:  Lab Results  Component Value Date   NA 135 05/10/2018   NA 141 09/17/2017   NA 137 08/21/2014   K 3.9 05/10/2018   K 3.5 08/21/2014   CL 99 05/10/2018   CL 101 08/21/2014   CO2 30 05/10/2018   CO2 27 08/21/2014   BUN 12 05/10/2018   BUN 14  09/17/2017   BUN 14 08/21/2014   CREATININE 0.75 05/10/2018   CREATININE 0.84 08/21/2014   PROT 7.6 05/04/2018   PROT 6.9 09/17/2017   PROT 8.3 (H) 08/21/2014   ALBUMIN 3.0 (L) 05/04/2018   ALBUMIN 4.1 09/17/2017   ALBUMIN 4.5 08/21/2014   BILITOT 0.7 05/04/2018   BILITOT <0.2 09/17/2017   BILITOT 0.6 08/21/2014   ALKPHOS 64 05/04/2018   ALKPHOS 48 08/21/2014   AST 22 05/04/2018   AST 30 08/21/2014   ALT 14 05/04/2018   ALT 21 08/21/2014  .   Total Time in preparing paper work, data evaluation and todays exam - 108 minutes  Lala Lund M.D on 05/11/2018 at 9:43 AM  [facility]   Office  [phone]

## 2018-05-11 NOTE — Discharge Instructions (Signed)
Follow with Primary MD Remi Haggard, FNP in 7 days   Get CBC, CMP, 2 view Chest X ray -  checked  by Primary MD  in 5-7 days   Activity: As tolerated with Full fall precautions use walker/cane & assistance as needed  Disposition Home    Diet: Heart Healthy    Special Instructions: If you have smoked or chewed Tobacco  in the last 2 yrs please stop smoking, stop any regular Alcohol  and or any Recreational drug use.  On your next visit with your primary care physician please Get Medicines reviewed and adjusted.  Please request your Prim.MD to go over all Hospital Tests and Procedure/Radiological results at the follow up, please get all Hospital records sent to your Prim MD by signing hospital release before you go home.  If you experience worsening of your admission symptoms, develop shortness of breath, life threatening emergency, suicidal or homicidal thoughts you must seek medical attention immediately by calling 911 or calling your MD immediately  if symptoms less severe.  You Must read complete instructions/literature along with all the possible adverse reactions/side effects for all the Medicines you take and that have been prescribed to you. Take any new Medicines after you have completely understood and accpet all the possible adverse reactions/side effects.

## 2018-05-11 NOTE — Progress Notes (Signed)
SATURATION QUALIFICATIONS: (This note is used to comply with regulatory documentation for home oxygen)  Patient Saturations on Room Air at Rest = 92%  Patient Saturations on Room Air while Ambulating = 84%  Patient Saturations on 2 Liters of oxygen while Ambulating = 93 %  Please briefly explain why patient needs home oxygen: 

## 2018-05-12 LAB — HYPERSENSITIVITY PNEUMONITIS
A. Pullulans Abs: NEGATIVE
A.Fumigatus #1 Abs: NEGATIVE
Micropolyspora faeni, IgG: NEGATIVE
PIGEON SERUM ABS: NEGATIVE
THERMOACT. SACCHARII: NEGATIVE
Thermoactinomyces vulgaris, IgG: NEGATIVE

## 2018-05-18 ENCOUNTER — Emergency Department
Admission: EM | Admit: 2018-05-18 | Discharge: 2018-05-18 | Disposition: A | Discharge status: Home / Self Care | Payer: 59 | Attending: Emergency Medicine | Admitting: Emergency Medicine

## 2018-05-18 ENCOUNTER — Emergency Department: Payer: 59

## 2018-05-18 ENCOUNTER — Other Ambulatory Visit: Payer: Self-pay

## 2018-05-18 DIAGNOSIS — Y999 Unspecified external cause status: Secondary | ICD-10-CM | POA: Diagnosis not present

## 2018-05-18 DIAGNOSIS — S99911A Unspecified injury of right ankle, initial encounter: Secondary | ICD-10-CM | POA: Diagnosis present

## 2018-05-18 DIAGNOSIS — W108XXA Fall (on) (from) other stairs and steps, initial encounter: Secondary | ICD-10-CM | POA: Diagnosis not present

## 2018-05-18 DIAGNOSIS — Y9302 Activity, running: Secondary | ICD-10-CM | POA: Diagnosis not present

## 2018-05-18 DIAGNOSIS — Z91040 Latex allergy status: Secondary | ICD-10-CM | POA: Diagnosis not present

## 2018-05-18 DIAGNOSIS — Z79899 Other long term (current) drug therapy: Secondary | ICD-10-CM | POA: Diagnosis not present

## 2018-05-18 DIAGNOSIS — E039 Hypothyroidism, unspecified: Secondary | ICD-10-CM | POA: Insufficient documentation

## 2018-05-18 DIAGNOSIS — Y92008 Other place in unspecified non-institutional (private) residence as the place of occurrence of the external cause: Secondary | ICD-10-CM | POA: Insufficient documentation

## 2018-05-18 DIAGNOSIS — M25572 Pain in left ankle and joints of left foot: Secondary | ICD-10-CM | POA: Insufficient documentation

## 2018-05-18 DIAGNOSIS — S82891A Other fracture of right lower leg, initial encounter for closed fracture: Secondary | ICD-10-CM | POA: Insufficient documentation

## 2018-05-18 DIAGNOSIS — W19XXXA Unspecified fall, initial encounter: Secondary | ICD-10-CM

## 2018-05-18 MED ORDER — HYDROCODONE-ACETAMINOPHEN 5-325 MG PO TABS: 2.0000 | ORAL_TABLET | Freq: Four times a day (QID) | ORAL | 0 refills | Status: DC | PRN

## 2018-05-18 MED ORDER — DOCUSATE SODIUM 100 MG PO CAPS: ORAL_CAPSULE | ORAL | 0 refills | Status: DC

## 2018-05-18 NOTE — ED Notes (Signed)
MD at bedside. 

## 2018-05-18 NOTE — Discharge Instructions (Addendum)
Fortunately you did not sustain any severe injuries from your fall, but you did break the outer part of your right ankle.  Please keep the splint in place, do not bear weight on that foot, and use crutches until you are cleared by podiatry.  We recommend that you follow-up with Dr. Elvina Mattes or one of his colleagues (podiatrists typically take care of patients with ankle fractures).    Use over-the-counter pain medication as needed according to label instructions. Take Norco as prescribed for severe pain. Do not drink alcohol, drive or participate in any other potentially dangerous activities while taking this medication as it may make you sleepy. Do not take this medication with any other sedating medications, either prescription or over-the-counter. If you were prescribed Percocet or Vicodin, do not take these with acetaminophen (Tylenol) as it is already contained within these medications.   This medication is an opiate (or narcotic) pain medication and can be habit forming.  Use it as little as possible to achieve adequate pain control.  Do not use or use it with extreme caution if you have a history of opiate abuse or dependence.  If you are on a pain contract with your primary care doctor or a pain specialist, be sure to let them know you were prescribed this medication today from the [facility] Emergency Department.  This medication is intended for your use only - do not give any to anyone else and keep it in a secure place where nobody else, especially children, have access to it.  It will also cause or worsen constipation, so you may want to consider taking an over-the-counter stool softener while you are taking this medication.

## 2018-05-18 NOTE — ED Notes (Signed)
Posterior Short leg splint applied to pt. Pt tolerated well. Crutches in room.

## 2018-05-18 NOTE — ED Provider Notes (Signed)
[facility] Emergency Department Provider Note  ____________________________________________   First MD Initiated Contact with Patient 05/18/18 0045     (approximate)  I have reviewed the triage vital signs and the nursing notes.   HISTORY  Chief Complaint Fall    HPI Jenny Hendrix is a 38 y.o. female with medical history as listed below who presents by EMS along with her 70-month-old son for evaluation after a fall.  In short, she admits that she has been drinking tonight.  The patient's son ingested a game piece from a board game and had a choking episode where she had to finger sweep the piece out of his mouth and he had some vomit with a little bit of blood in it.  She was very concerned by this and tried to run down stairs with him but lost her footing and fell down the flight of stairs.  She tried to protect her son during the fall but sustained some superficial injuries but also is having some moderate pain in her right ankle and some mild pain in her left.  The pain in her ankles are reproduced with moving around and bearing weight is difficult but she is not tried to bear much weight since the fall.  She did not lose consciousness and has no headache nor neck pain.  She is having no chest pain and no difficulty breathing.  The onset of the accident was acute.  She is extremely distraught but mostly over the possible injury to her child and not over her own ankle.  Past Medical History:  Diagnosis Date  . Anemia   . Anxiety   . ASCUS of cervix with negative high risk HPV 05/22/2016  . Depression   . HSV infection   . Hypothyroid   . Hypothyroidism   . Hypoxia 04/2018  . Ovarian cyst   . Pneumonia     Patient Active Problem List   Diagnosis Date Noted  . HCAP (healthcare-associated pneumonia)   . Pulmonary infiltrates   . Abnormal CXR 05/05/2018  . Pneumonitis 05/04/2018  . Acute respiratory failure with hypoxia ([place]) 05/04/2018  . Chronic  anemia 05/04/2018  . Thrombocytosis ([place]) 05/04/2018  . Sepsis ([place]) 04/28/2018  . CAP (community acquired pneumonia) 04/28/2018  . Cyst of left ovary 03/10/2018  . Encounter for planned induction of labor 09/05/2017  . Labial lesion 08/13/2017  . Pregnancy 08/13/2017  . Vomiting and diarrhea 06/01/2017  . Anxiety and depression 01/27/2017  . Supervision of high risk pregnancy, antepartum 01/27/2017  . Obesity in pregnancy 01/27/2017  . GERD (gastroesophageal reflux disease) 01/27/2017  . Hypothyroidism 01/27/2017  . Pelvic pain in female 01/12/2017    Past Surgical History:  Procedure Laterality Date  . CESAREAN SECTION N/A 09/06/2017   Procedure: CESAREAN SECTION;  Surgeon: Malachy Mood, MD;  Location: [facility] ORS;  Service: Obstetrics;  Laterality: N/A;  . DILATION AND EVACUATION N/A 06/19/2016   Procedure: DILATATION AND EVACUATION;  Surgeon: Gae Dry, MD;  Location: [facility] ORS;  Service: Gynecology;  Laterality: N/A;  . TONSILLECTOMY AND ADENOIDECTOMY    . WISDOM TOOTH EXTRACTION      Prior to Admission medications   Medication Sig Start Date End Date Taking? Authorizing Provider  albuterol (PROAIR HFA) 108 (90 Base) MCG/ACT inhaler Inhale 1-2 puffs into the lungs every 6 (six) hours as needed for wheezing or shortness of breath.    [provider]  amLODipine (NORVASC) 10 MG tablet Take 1 tablet (10 mg  total) by mouth daily. 05/11/18   Thurnell Lose, MD  clonazePAM (KLONOPIN) 1 MG tablet Take 1 mg by mouth 3 (three) times daily as needed for anxiety.  02/02/18   [provider]  docusate sodium (COLACE) 100 MG capsule Take 1 tablet once or twice daily as needed for constipation while taking narcotic pain medicine 05/18/18   Hinda Kehr, MD  HYDROcodone-acetaminophen (NORCO/VICODIN) 5-325 MG tablet Take 2 tablets by mouth every 6 (six) hours as needed for moderate pain or severe pain. 05/18/18   Hinda Kehr, MD  ipratropium-albuterol (DUONEB) 0.5-2.5  (3) MG/3ML SOLN Use twice a day scheduled and every 4 hours as needed for shortness of breath and wheezing 05/11/18   Thurnell Lose, MD  levonorgestrel (MIRENA) 20 MCG/24HR IUD 1 Intra Uterine Device (1 each total) by Intrauterine route once for 1 dose. 03/01/18 07/05/74  Copland, Deirdre Evener, PA-C  levothyroxine (SYNTHROID, LEVOTHROID) 50 MCG tablet Take 1 tablet (50 mcg total) by mouth daily before breakfast. 11/07/17   Malachy Mood, MD  methylPREDNISolone (MEDROL DOSEPAK) 4 MG TBPK tablet follow package directions 05/11/18   Thurnell Lose, MD  omeprazole (PRILOSEC) 40 MG capsule Take 40 mg by mouth at bedtime.     [provider]  sertraline (ZOLOFT) 100 MG tablet Take 1 tablet (100 mg total) by mouth daily. Patient taking differently: Take 50 mg by mouth daily with lunch.  11/06/17   Malachy Mood, MD    Allergies Azithromycin; Benzonatate; Metronidazole; Latex; and Tape  Family History  Problem Relation Age of Onset  . Diabetes Mother   . Hyperlipidemia Maternal Grandmother   . Hypertension Maternal Grandmother   . Breast cancer Maternal Grandmother   . Thyroid disease Maternal Grandmother     Social History Social History   Tobacco Use  . Smoking status: Never Smoker  . Smokeless tobacco: Never Used  Substance Use Topics  . Alcohol use: Yes    Comment: "ocassionally"  . Drug use: No    Review of Systems Constitutional: No fever/chills Eyes: No visual changes. ENT: No sore throat. Cardiovascular: Denies chest pain. Respiratory: Denies shortness of breath. Gastrointestinal: No abdominal pain.  No nausea, no vomiting.  No diarrhea.  No constipation. Genitourinary: Negative for dysuria. Musculoskeletal: Pain in both ankles, right greater than left, minimal swelling. Integumentary: Negative for rash. Neurological: Negative for headaches, focal weakness or numbness.   ____________________________________________   PHYSICAL EXAM:  VITAL SIGNS: ED  Triage Vitals [05/18/18 0027]  Enc Vitals Group     BP (!) 125/98     Pulse Rate 92     Resp 20     Temp 98.8 F (37.1 C)     Temp Source Oral     SpO2 100 %     Weight 96 kg (211 lb 10.3 oz)     Height 1.6 m (5\' 3" )     Head Circumference      Peak Flow      Pain Score 3     Pain Loc      Pain Edu?      Excl. in [place]?     Constitutional: Alert and oriented.  Distraught over the accident and possible injury to her child but otherwise appropriate. Eyes: Conjunctivae are normal.  Head: Atraumatic. Nose: No congestion/rhinnorhea. Mouth/Throat: Mucous membranes are moist. Neck: No stridor.  No meningeal signs.  No cervical spine tenderness to palpation. Cardiovascular: Normal rate, regular rhythm. Good peripheral circulation. Grossly normal heart sounds. Respiratory: Normal respiratory  effort.  No retractions. Lungs CTAB. Gastrointestinal: Soft and nontender. No distention.  Musculoskeletal: Tenderness to palpation and with range of motion of the right ankle.  No significant swelling or ecchymosis.  Some milder pain to the left ankle. Neurologic:  Normal speech and language. No gross focal neurologic deficits are appreciated.  Skin:  Skin is warm, dry and intact. No rash noted. Psychiatric: Mood and affect are normal. Speech and behavior are normal.  ____________________________________________   LABS (all labs ordered are listed, but only abnormal results are displayed)  Labs Reviewed - No data to display ____________________________________________  EKG  No indication for EKG ____________________________________________  RADIOLOGY I, Hinda Kehr, personally viewed and evaluated these images (plain radiographs) as part of my medical decision making, as well as reviewing the written report by the radiologist.  ED MD interpretation: Mildly displaced oblique fracture of the right lateral malleolus.  Official radiology report(s): Dg Ankle Complete Left  Result Date:  05/18/2018 CLINICAL DATA:  38 year old female with trauma to the left ankle. EXAM: LEFT ANKLE COMPLETE - 3+ VIEW COMPARISON:  Right ankle radiograph dated 05/18/2018 FINDINGS: There is no evidence of fracture, dislocation, or joint effusion. There is no evidence of arthropathy or other focal bone abnormality. Soft tissues are unremarkable. IMPRESSION: Negative. Electronically Signed   By: Anner Crete M.D.   On: 05/18/2018 00:49   Dg Ankle Complete Right  Result Date: 05/18/2018 CLINICAL DATA:  38 year old female with injury to the right ankle. EXAM: RIGHT ANKLE - COMPLETE 3+ VIEW COMPARISON:  Left ankle radiograph dated 05/18/2018 FINDINGS: Mildly displaced oblique fracture of the lateral malleolus with 4 mm lateral displacement of the distal fracture fragment. No other acute fracture identified. No dislocation. The ankle mortise is intact. The soft tissue swelling over the lateral malleolus. No radiopaque foreign object. IMPRESSION: Mildly displaced oblique fracture of the lateral malleolus. Electronically Signed   By: Anner Crete M.D.   On: 05/18/2018 00:48    ____________________________________________   PROCEDURES  Critical Care performed: No   Procedure(s) performed:   .Ortho Injury Treatment Date/Time: 05/18/2018 3:10 AM Performed by: Hinda Kehr, MD Authorized by: Hinda Kehr, MD   Consent:    Consent obtained:  Verbal   Consent given by:  Patient   Risks discussed:  Fracture   Alternatives discussed:  No treatmentInjury location: ankle Location details: right ankle Injury type: fracture Fracture type: lateral malleolus Pre-procedure neurovascular assessment: neurovascularly intact Pre-procedure distal perfusion: normal Pre-procedure neurological function: normal Manipulation performed: no Immobilization: splint Splint type: short leg Supplies used: Ortho-Glass Post-procedure neurovascular assessment: post-procedure neurovascularly intact Post-procedure  distal perfusion: normal Post-procedure neurological function: normal Patient tolerance: Patient tolerated the procedure well with no immediate complications      ____________________________________________   INITIAL IMPRESSION / ASSESSMENT AND PLAN / ED COURSE  As part of my medical decision making, I reviewed the following data within the [facility] notes reviewed and incorporated, Old chart reviewed, Radiograph reviewed , Notes from prior ED visits and [place] Controlled Substance Database    Differential diagnosis includes, but is not limited to, acute intracranial injury, ankle fracture, ankle sprain, any other musculoskeletal injury secondary to her fall.  The patient admits to having been drinking tonight.  She has a mildly displaced fracture of the right lateral malleolus.  We placed her in a splint and she was neurovascularly intact both before and after splint placement.  I encouraged her to follow-up with podiatry or orthopedics but recommended podiatry primarily.  She  understands and agrees with the plan.  She is clinically sober at this time and has no evidence of head injury.  She does have a few abrasions on the rest of her body but is moving all 4 extremities and indicating no other pain.  I gave my usual and customary return precautions.    ____________________________________________  FINAL CLINICAL IMPRESSION(S) / ED DIAGNOSES  Final diagnoses:  Fall, initial encounter  Closed fracture of right ankle, initial encounter     MEDICATIONS GIVEN DURING THIS VISIT:  Medications - No data to display   ED Discharge Orders         Ordered    HYDROcodone-acetaminophen (NORCO/VICODIN) 5-325 MG tablet  Every 6 hours PRN     05/18/18 0136    docusate sodium (COLACE) 100 MG capsule     05/18/18 0136           Note:  This document was prepared using Dragon voice recognition software and may include unintentional dictation errors.    Hinda Kehr, MD 05/18/18 [phone]

## 2018-05-18 NOTE — ED Triage Notes (Signed)
Pt fell down approx 17-18 stairs co bilat ankle pain. States has been drinking vodka today. Was rushing to come to hospital to bring son in for possible plastic game piece ingestionl.

## 2018-05-18 NOTE — ED Notes (Signed)
Pt states that she fell down the stair when she was coming to bring son to ED for hematoemesis after pt swallowed game piece. Pt states that she is very upset that her son had not been seen by the doctor yet and was getting agitated that nothing was being done. Pt husband came to ED front desk and stated that if the doctor did not come in then he was "going to show his ass."

## 2018-05-21 ENCOUNTER — Other Ambulatory Visit: Payer: Self-pay | Admitting: Podiatry

## 2018-05-21 NOTE — Progress Notes (Unsigned)
Pt called out of Norco.  Will plan to call in additional refill.

## 2018-06-04 ENCOUNTER — Inpatient Hospital Stay: Payer: 59 | Admitting: Primary Care

## 2018-06-08 ENCOUNTER — Ambulatory Visit (INDEPENDENT_AMBULATORY_CARE_PROVIDER_SITE_OTHER): Payer: 59 | Admitting: Nurse Practitioner

## 2018-06-08 ENCOUNTER — Encounter: Payer: Self-pay | Admitting: Nurse Practitioner

## 2018-06-08 ENCOUNTER — Ambulatory Visit (INDEPENDENT_AMBULATORY_CARE_PROVIDER_SITE_OTHER)
Admission: RE | Admit: 2018-06-08 | Discharge: 2018-06-08 | Disposition: A | Discharge status: Home / Self Care | Payer: 59 | Source: Ambulatory Visit | Attending: Nurse Practitioner | Admitting: Nurse Practitioner

## 2018-06-08 VITALS — BP 118/78 | HR 94 | Ht 63.0 in | Wt 206.0 lb

## 2018-06-08 DIAGNOSIS — R0602 Shortness of breath: Secondary | ICD-10-CM

## 2018-06-08 DIAGNOSIS — J189 Pneumonia, unspecified organism: Secondary | ICD-10-CM

## 2018-06-08 NOTE — Progress Notes (Signed)
@Patient  ID: Jenny Hendrix, female    DOB: [birth year], 38 y.o.   MRN: [id number]  Chief Complaint  Patient presents with  . Hospitalization Follow-up    Hospital f/u. States she developed a cough and slowly she got more SOB and ended up in ED dx with PNE.     Referring provider: Remi Haggard, FNP  HPI 38 year old female never smoker with recent admission to the hospital with acute hypoxic related respiratory failure due to community-acquired pneumonia.  Tests: CXR 05/09/18 - Interval partial clearing of bilateral airspace opacities consistent with improving pneumonitis. No new findings. CXR 05/07/18 - Enlargement of cardiac silhouette. Progressive BILATERAL airspace infiltrates especially in the upper lobes, question pneumonia particularly atypical etiologies, less likely edema or pulmonary hemorrhage. CTA 06/09/18 - No pulmonary embolus. Progression of diffuse ground-glass opacities throughout both lungs since CT 5 days ago, with slight basilar sparing. Developing air bronchograms in the upper lobes. Differential considerations are broad and findings may be secondary to atypical infection, acute interstitial pneumonia, ARDS, or pulmonary hemorrhage. Atypical pulmonary edema is also considered.  OV 06/09/18 -hospital follow-up 05/04/2018 -05/11/2018 Patient presents today for a hospital follow-up.  She was admitted on 05/04/2018 for respiratory failure with oxygen saturation of 65% on room air and community-acquired pneumonia.  She was treated with broad-spectrum IV Zosyn and IV steroids she was discharged home with oral prednisone taper.  She states that she has been doing well since hospital discharge.  She did have a fall and fractured her right ankle and sprained her left ankle.  She states that other than that she is back to her baseline.     Allergies  Allergen Reactions  . Azithromycin Nausea And Vomiting    Vomits if received via IV  . Benzonatate Diarrhea  . Metronidazole  Other (See Comments)    Body aches, all-over side effects   . Latex Rash    NO POWDERED GLOVES!!  . Tape Rash    No Band-Aids!!    Immunization History  Administered Date(s) Administered  . Influenza-Unspecified 01/16/2014  . Tdap 06/24/2017    Past Medical History:  Diagnosis Date  . Anemia   . Anxiety   . ASCUS of cervix with negative high risk HPV 05/22/2016  . Depression   . HSV infection   . Hypothyroid   . Hypothyroidism   . Hypoxia 04/2018  . Ovarian cyst   . Pneumonia     Tobacco History: Social History   Tobacco Use  Smoking Status Never Smoker  Smokeless Tobacco Never Used   Counseling given: Yes   Outpatient Encounter Medications as of 06/08/2018  Medication Sig  . albuterol (PROAIR HFA) 108 (90 Base) MCG/ACT inhaler Inhale 1-2 puffs into the lungs every 6 (six) hours as needed for wheezing or shortness of breath.  Marland Kitchen amLODipine (NORVASC) 10 MG tablet Take 1 tablet (10 mg total) by mouth daily.  . clonazePAM (KLONOPIN) 1 MG tablet Take 1 mg by mouth 3 (three) times daily as needed for anxiety.   . docusate sodium (COLACE) 100 MG capsule Take 1 tablet once or twice daily as needed for constipation while taking narcotic pain medicine  . HYDROcodone-acetaminophen (NORCO/VICODIN) 5-325 MG tablet Take 2 tablets by mouth every 6 (six) hours as needed for moderate pain or severe pain.  Marland Kitchen ipratropium-albuterol (DUONEB) 0.5-2.5 (3) MG/3ML SOLN Use twice a day scheduled and every 4 hours as needed for shortness of breath and wheezing  . levothyroxine (SYNTHROID, LEVOTHROID) 50 MCG tablet Take  1 tablet (50 mcg total) by mouth daily before breakfast.  . methylPREDNISolone (MEDROL DOSEPAK) 4 MG TBPK tablet follow package directions  . omeprazole (PRILOSEC) 40 MG capsule Take 40 mg by mouth at bedtime.   . sertraline (ZOLOFT) 100 MG tablet Take 1 tablet (100 mg total) by mouth daily. (Patient taking differently: Take 50 mg by mouth daily with lunch. )  . levonorgestrel  (MIRENA) 20 MCG/24HR IUD 1 Intra Uterine Device (1 each total) by Intrauterine route once for 1 dose.   No facility-administered encounter medications on file as of 06/08/2018.      Review of Systems  Review of Systems  Constitutional: Negative.  Negative for chills and fever.  HENT: Negative.   Respiratory: Negative for cough, shortness of breath and wheezing.   Cardiovascular: Negative.  Negative for chest pain, palpitations and leg swelling.  Gastrointestinal: Negative.   Allergic/Immunologic: Negative.   Neurological: Negative.   Psychiatric/Behavioral: Negative.        Physical Exam  BP 118/78   Pulse 94   Ht 5\' 3"  (1.6 m)   Wt 206 lb (93.4 kg)   LMP 05/23/2018 (Within Days)   SpO2 99%   BMI 36.49 kg/m   Wt Readings from Last 5 Encounters:  06/08/18 206 lb (93.4 kg)  05/18/18 211 lb 10.3 oz (96 kg)  05/05/18 212 lb (96.2 kg)  04/28/18 219 lb 2.2 oz (99.4 kg)  03/09/18 216 lb (98 kg)     Physical Exam Vitals signs and nursing note reviewed.  Constitutional:      General: She is not in acute distress.    Appearance: She is well-developed.  Cardiovascular:     Rate and Rhythm: Normal rate and regular rhythm.  Pulmonary:     Effort: Pulmonary effort is normal. No respiratory distress.     Breath sounds: Normal breath sounds. No wheezing or rhonchi.  Musculoskeletal:        General: No swelling.  Neurological:     Mental Status: She is alert and oriented to person, place, and time.        Assessment & Plan:   CAP (community acquired pneumonia) Patient is back to baseline. Has recovered well. Will recheck chest x ray today. Will order follow up CT scan in 1 month to follow up on abnormal CT scan in hospital.  Patient Instructions  Continue albuterol as needed Will recheck chest x ray today and call with results Will recheck HRCT in 1 month Follow up with Dr. Loanne Hendrix to establish care after CT scan in 6 weeks.         Fenton Foy,  NP 06/10/2018

## 2018-06-08 NOTE — Patient Instructions (Signed)
Continue albuterol as needed Will recheck chest x ray today and call with results Will recheck HRCT in 1 month Follow up with Dr. Loanne Drilling to establish care after CT scan in 6 weeks.

## 2018-06-09 ENCOUNTER — Telehealth: Payer: Self-pay | Admitting: Nurse Practitioner

## 2018-06-09 NOTE — Telephone Encounter (Signed)
Notes recorded by Fenton Foy, NP on 06/09/2018 at 10:29 AM EST Please call to let patient know that her follow up chest x ray was clear. ------------  Spoke with pt, aware of results/recs.  Nothing further needed.

## 2018-06-10 ENCOUNTER — Encounter: Payer: Self-pay | Admitting: Nurse Practitioner

## 2018-06-10 NOTE — Assessment & Plan Note (Signed)
Patient is back to baseline. Has recovered well. Will recheck chest x ray today. Will order follow up CT scan in 1 month to follow up on abnormal CT scan in hospital.  Patient Instructions  Continue albuterol as needed Will recheck chest x ray today and call with results Will recheck HRCT in 1 month Follow up with Dr. Loanne Drilling to establish care after CT scan in 6 weeks.

## 2018-06-29 ENCOUNTER — Other Ambulatory Visit: Payer: Self-pay | Admitting: Podiatry

## 2018-06-29 ENCOUNTER — Ambulatory Visit
Admission: RE | Admit: 2018-06-29 | Discharge: 2018-06-29 | Disposition: A | Discharge status: Home / Self Care | Payer: 59 | Source: Ambulatory Visit | Attending: Podiatry | Admitting: Podiatry

## 2018-06-29 DIAGNOSIS — M7989 Other specified soft tissue disorders: Secondary | ICD-10-CM | POA: Diagnosis present

## 2018-07-07 ENCOUNTER — Ambulatory Visit
Admission: RE | Admit: 2018-07-07 | Discharge: 2018-07-07 | Disposition: A | Discharge status: Home / Self Care | Payer: 59 | Source: Ambulatory Visit | Attending: Nurse Practitioner | Admitting: Nurse Practitioner

## 2018-07-07 DIAGNOSIS — R0602 Shortness of breath: Secondary | ICD-10-CM | POA: Insufficient documentation

## 2018-07-19 ENCOUNTER — Ambulatory Visit: Payer: 59 | Admitting: Pulmonary Disease

## 2018-07-23 ENCOUNTER — Ambulatory Visit: Payer: BLUE CROSS/BLUE SHIELD | Admitting: Obstetrics and Gynecology

## 2018-08-28 ENCOUNTER — Other Ambulatory Visit: Payer: Self-pay | Admitting: Obstetrics and Gynecology

## 2018-11-26 ENCOUNTER — Other Ambulatory Visit: Payer: Self-pay

## 2018-11-26 ENCOUNTER — Ambulatory Visit (INDEPENDENT_AMBULATORY_CARE_PROVIDER_SITE_OTHER): Payer: 59 | Admitting: Advanced Practice Midwife

## 2018-11-26 ENCOUNTER — Encounter: Payer: Self-pay | Admitting: Advanced Practice Midwife

## 2018-11-26 VITALS — BP 122/74 | Ht 63.0 in | Wt 218.0 lb

## 2018-11-26 DIAGNOSIS — Z01419 Encounter for gynecological examination (general) (routine) without abnormal findings: Secondary | ICD-10-CM

## 2018-11-26 DIAGNOSIS — N946 Dysmenorrhea, unspecified: Secondary | ICD-10-CM

## 2018-11-26 MED ORDER — OXYCODONE HCL 5 MG PO TABS: 5.0000 mg | ORAL_TABLET | Freq: Four times a day (QID) | ORAL | 0 refills | Status: DC | PRN

## 2018-11-26 NOTE — Patient Instructions (Signed)
Health Maintenance, Female Adopting a healthy lifestyle and getting preventive care are important in promoting health and wellness. Ask your health care provider about:  The right schedule for you to have regular tests and exams.  Things you can do on your own to prevent diseases and keep yourself healthy. What should I know about diet, weight, and exercise? Eat a healthy diet   Eat a diet that includes plenty of vegetables, fruits, low-fat dairy products, and lean protein.  Do not eat a lot of foods that are high in solid fats, added sugars, or sodium. Maintain a healthy weight Body mass index (BMI) is used to identify weight problems. It estimates body fat based on height and weight. Your health care provider can help determine your BMI and help you achieve or maintain a healthy weight. Get regular exercise Get regular exercise. This is one of the most important things you can do for your health. Most adults should:  Exercise for at least 150 minutes each week. The exercise should increase your heart rate and make you sweat (moderate-intensity exercise).  Do strengthening exercises at least twice a week. This is in addition to the moderate-intensity exercise.  Spend less time sitting. Even light physical activity can be beneficial. Watch cholesterol and blood lipids Have your blood tested for lipids and cholesterol at 36 years of age, then have this test every 5 years. Have your cholesterol levels checked more often if:  Your lipid or cholesterol levels are high.  You are older than 36 years of age.  You are at high risk for heart disease. What should I know about cancer screening? Depending on your health history and family history, you may need to have cancer screening at various ages. This may include screening for:  Breast cancer.  Cervical cancer.  Colorectal cancer.  Skin cancer.  Lung cancer. What should I know about heart disease, diabetes, and high blood  pressure? Blood pressure and heart disease  High blood pressure causes heart disease and increases the risk of stroke. This is more likely to develop in people who have high blood pressure readings, are of African descent, or are overweight.  Have your blood pressure checked: ? Every 3-5 years if you are 18-39 years of age. ? Every year if you are 40 years old or older. Diabetes Have regular diabetes screenings. This checks your fasting blood sugar level. Have the screening done:  Once every three years after age 40 if you are at a normal weight and have a low risk for diabetes.  More often and at a younger age if you are overweight or have a high risk for diabetes. What should I know about preventing infection? Hepatitis B If you have a higher risk for hepatitis B, you should be screened for this virus. Talk with your health care provider to find out if you are at risk for hepatitis B infection. Hepatitis C Testing is recommended for:  Everyone born from 1945 through 1965.  Anyone with known risk factors for hepatitis C. Sexually transmitted infections (STIs)  Get screened for STIs, including gonorrhea and chlamydia, if: ? You are sexually active and are younger than 36 years of age. ? You are older than 36 years of age and your health care provider tells you that you are at risk for this type of infection. ? Your sexual activity has changed since you were last screened, and you are at increased risk for chlamydia or gonorrhea. Ask your health care provider if   you are at risk.  Ask your health care provider about whether you are at high risk for HIV. Your health care provider may recommend a prescription medicine to help prevent HIV infection. If you choose to take medicine to prevent HIV, you should first get tested for HIV. You should then be tested every 3 months for as long as you are taking the medicine. Pregnancy  If you are about to stop having your period (premenopausal) and  you may become pregnant, seek counseling before you get pregnant.  Take 400 to 800 micrograms (mcg) of folic acid every day if you become pregnant.  Ask for birth control (contraception) if you want to prevent pregnancy. Osteoporosis and menopause Osteoporosis is a disease in which the bones lose minerals and strength with aging. This can result in bone fractures. If you are 65 years old or older, or if you are at risk for osteoporosis and fractures, ask your health care provider if you should:  Be screened for bone loss.  Take a calcium or vitamin D supplement to lower your risk of fractures.  Be given hormone replacement therapy (HRT) to treat symptoms of menopause. Follow these instructions at home: Lifestyle  Do not use any products that contain nicotine or tobacco, such as cigarettes, e-cigarettes, and chewing tobacco. If you need help quitting, ask your health care provider.  Do not use street drugs.  Do not share needles.  Ask your health care provider for help if you need support or information about quitting drugs. Alcohol use  Do not drink alcohol if: ? Your health care provider tells you not to drink. ? You are pregnant, may be pregnant, or are planning to become pregnant.  If you drink alcohol: ? Limit how much you use to 0-1 drink a day. ? Limit intake if you are breastfeeding.  Be aware of how much alcohol is in your drink. In the [place] one drink equals one 12 oz bottle of beer (355 mL), one 5 oz glass of wine (148 mL), or one 1 oz glass of hard liquor (44 mL). General instructions  Schedule regular health, dental, and eye exams.  Stay current with your vaccines.  Tell your health care provider if: ? You often feel depressed. ? You have ever been abused or do not feel safe at home. Summary  Adopting a healthy lifestyle and getting preventive care are important in promoting health and wellness.  Follow your health care provider's instructions about healthy  diet, exercising, and getting tested or screened for diseases.  Follow your health care provider's instructions on monitoring your cholesterol and blood pressure. This information is not intended to replace advice given to you by your health care provider. Make sure you discuss any questions you have with your health care provider. Document Released: 10/28/2010 Document Revised: 04/07/2018 Document Reviewed: 04/07/2018 Elsevier Patient Education  2020 Else[employer] Inc[employer]  

## 2018-11-30 NOTE — Progress Notes (Signed)
Gynecology Annual Exam   PCP: Remi Haggard, FNP  Chief Complaint:  Chief Complaint  Patient presents with  . Annual Exam    History of Present Illness: Patient is a 38 y.o. G2P1011 presents for annual exam. The patient has complaint today of ongoing severe menstrual pain. She has had this issue for many years and has found that the only thing that helps in the first 2 days of each cycle is narcotic pain medicine. She claims that NSAIDS do not help her. She only takes the medication when she is at home and not when she goes to her job.   She was treated for pneumonia in January and says she tested positive for Covid 19. That result is not in her chart. She had a negative respiratory panel in January. She reports that her lungs are still recovering.   LMP: Patient's last menstrual period was 11/26/2018. Average Interval: regular, 28 days Duration of flow: 5-6 days Heavy Menses: no Clots: no Intermenstrual Bleeding: no Postcoital Bleeding: no Dysmenorrhea: yes  The patient is sexually active. She currently uses IUD for contraception. She denies dyspareunia.  The patient does perform self breast exams.  There is notable family history of breast or ovarian cancer in her family. Her maternal grandmother had breast cancer at age 55.   The patient wears seatbelts: yes.   The patient has regular exercise: She walks at work and takes the stairs at her apartment. She tries to eat a healthy diet. She admits adequate hydration. She gets 5-6 hours sleep per night.    The patient denies current symptoms of depression.  She admits her current dose of Zoloft (50 mg) is effective. She does admit a current lack of social support system.  Review of Systems: Review of Systems  Constitutional: Negative.   HENT: Negative.   Eyes: Negative.   Respiratory:       Shortness of breath since having pneumonia in January  Cardiovascular: Negative.   Gastrointestinal: Negative.   Genitourinary:     Menstrual pain  Musculoskeletal: Negative.   Skin: Negative.   Neurological: Negative.   Endo/Heme/Allergies: Negative.   Psychiatric/Behavioral: Negative.     Past Medical History:  Past Medical History:  Diagnosis Date  . Anemia   . Anxiety   . ASCUS of cervix with negative high risk HPV 05/22/2016  . Depression   . HSV infection   . Hypothyroid   . Hypothyroidism   . Hypoxia 04/2018  . Ovarian cyst   . Pneumonia     Past Surgical History:  Past Surgical History:  Procedure Laterality Date  . CESAREAN SECTION N/A 09/06/2017   Procedure: CESAREAN SECTION;  Surgeon: Malachy Mood, MD;  Location: [facility] ORS;  Service: Obstetrics;  Laterality: N/A;  . DILATION AND EVACUATION N/A 06/19/2016   Procedure: DILATATION AND EVACUATION;  Surgeon: Gae Dry, MD;  Location: [facility] ORS;  Service: Gynecology;  Laterality: N/A;  . TONSILLECTOMY AND ADENOIDECTOMY    . WISDOM TOOTH EXTRACTION      Gynecologic History:  Patient's last menstrual period was 11/26/2018. Contraception: IUD Last Pap: 2 years ago Results were: no abnormalities   Obstetric History: G2P1011  Family History:  Family History  Problem Relation Age of Onset  . Diabetes Mother   . Hyperlipidemia Maternal Grandmother   . Hypertension Maternal Grandmother   . Breast cancer Maternal Grandmother   . Thyroid disease Maternal Grandmother     Social History:  Social History   Socioeconomic  History  . Marital status: Married    Spouse name: Marijo Quizon  . Number of children: 1  . Years of education: Not on file  . Highest education level: Not on file  Occupational History  . Occupation: [employer]  . Financial resource strain: Somewhat hard  . Food insecurity    Worry: Never true    Inability: Never true  . Transportation needs    Medical: No    Non-medical: No  Tobacco Use  . Smoking status: Never Smoker  . Smokeless tobacco: Never Used  Substance and Sexual Activity   . Alcohol use: Yes    Comment: "ocassionally"  . Drug use: No  . Sexual activity: Yes    Birth control/protection: I.U.D.    Comment: Mirena  Lifestyle  . Physical activity    Days per week: 0 days    Minutes per session: Not on file  . Stress: Rather much  Relationships  . Social Herbalist on phone: Twice a week    Gets together: Once a week    Attends religious service: Never    Active member of club or organization: No    Attends meetings of clubs or organizations: Never    Relationship status: Married  . Intimate partner violence    Fear of current or ex partner: No    Emotionally abused: No    Physically abused: No    Forced sexual activity: No  Other Topics Concern  . Not on file  Social History Narrative  . Not on file    Allergies:  Allergies  Allergen Reactions  . Azithromycin Nausea And Vomiting    Vomits if received via IV  . Benzonatate Diarrhea  . Metronidazole Other (See Comments)    Body aches, all-over side effects   . Latex Rash    NO POWDERED GLOVES!!  . Tape Rash    No Band-Aids!!    Medications: Prior to Admission medications   Medication Sig Start Date End Date Taking? Authorizing Provider  albuterol (PROAIR HFA) 108 (90 Base) MCG/ACT inhaler Inhale 1-2 puffs into the lungs every 6 (six) hours as needed for wheezing or shortness of breath.   Yes [provider]  amLODipine (NORVASC) 10 MG tablet Take 1 tablet (10 mg total) by mouth daily. 05/11/18  Yes Thurnell Lose, MD  clonazePAM (KLONOPIN) 1 MG tablet Take 1 mg by mouth 3 (three) times daily as needed for anxiety.  02/02/18  Yes [provider]  ipratropium-albuterol (DUONEB) 0.5-2.5 (3) MG/3ML SOLN Use twice a day scheduled and every 4 hours as needed for shortness of breath and wheezing 05/11/18  Yes Thurnell Lose, MD  omeprazole (PRILOSEC) 40 MG capsule Take 40 mg by mouth at bedtime.    Yes [provider]  sertraline (ZOLOFT) 100 MG tablet  Take 1 tablet (100 mg total) by mouth daily. Patient taking differently: Take 50 mg by mouth daily with lunch.  11/06/17  Yes Malachy Mood, MD  D3-50 1.25 MG (50000 UT) capsule Take 50,000 Units by mouth daily. 11/18/18   [provider]  levonorgestrel (MIRENA) 20 MCG/24HR IUD 1 Intra Uterine Device (1 each total) by Intrauterine route once for 1 dose. 03/01/18 07/28/37  Copland, Deirdre Evener, PA-C  levothyroxine (SYNTHROID) 75 MCG tablet TAKE 1 TABLET BY MOUTH EVERY DAY IN THE MORNING 11/18/18   [provider]  oxyCODONE (ROXICODONE) 5 MG immediate release tablet Take 1 tablet (5 mg total) by  mouth every 6 (six) hours as needed for severe pain. For menstrual pain 11/26/18   Rod Can, CNM    Physical Exam Vitals: Blood pressure 122/74, height 5\' 3"  (1.6 m), weight 218 lb (98.9 kg), last menstrual period 11/26/2018  General: NAD HEENT: normocephalic, anicteric Thyroid: no enlargement, no palpable nodules Pulmonary: No increased work of breathing, CTAB Cardiovascular: RRR, distal pulses 2+ Breast: Breast symmetrical, no tenderness, no palpable nodules or masses, no skin or nipple retraction present, no nipple discharge.  No axillary or supraclavicular lymphadenopathy. Abdomen: NABS, soft, non-tender, non-distended.  Umbilicus without lesions.  No hepatomegaly, splenomegaly or masses palpable. No evidence of hernia  Genitourinary: deferred for no concerns/PAP interval Extremities: no edema, erythema, or tenderness Neurologic: Grossly intact Psychiatric: mood appropriate, affect full    Assessment: 38 y.o. G2P1011 routine annual exam  Plan: Problem List Items Addressed This Visit    None    Visit Diagnoses    Well woman exam with routine gynecological exam    -  Primary   Severe dysmenorrhea       Relevant Medications   oxyCODONE (ROXICODONE) 5 MG immediate release tablet  30 tablets not to be refilled before 1 year    1) STI screening  was offered and declined   2)  ASCCP guidelines and rational discussed.  Patient opts for every 3 years screening interval  3) Contraception - the patient is currently using  IUD.  She is happy with her current form of contraception and plans to continue. Her current IUD was placed August 2019  4) Routine healthcare maintenance including cholesterol, diabetes screening discussed Declines  5) Return in about 1 year (around 11/26/2019) for annual established gyn.   Rod Can, [facility] 11/30/2018, 1:27 PM

## 2019-06-14 NOTE — Telephone Encounter (Signed)
Mirena rcvd/charged 03/01/18

## 2019-09-06 ENCOUNTER — Ambulatory Visit
Admission: EM | Admit: 2019-09-06 | Discharge: 2019-09-06 | Disposition: A | Discharge status: Home / Self Care | Payer: BC Managed Care – PPO

## 2019-09-06 ENCOUNTER — Other Ambulatory Visit: Payer: Self-pay

## 2019-09-06 ENCOUNTER — Encounter: Payer: Self-pay | Admitting: Emergency Medicine

## 2019-09-06 DIAGNOSIS — R05 Cough: Secondary | ICD-10-CM | POA: Diagnosis not present

## 2019-09-06 DIAGNOSIS — H6693 Otitis media, unspecified, bilateral: Secondary | ICD-10-CM

## 2019-09-06 DIAGNOSIS — R059 Cough, unspecified: Secondary | ICD-10-CM

## 2019-09-06 MED ORDER — AMOXICILLIN 875 MG PO TABS: 875.0000 mg | ORAL_TABLET | Freq: Two times a day (BID) | ORAL | 0 refills | Status: AC

## 2019-09-06 NOTE — ED Triage Notes (Signed)
Patient in today c/o cough x 3 days, bilateral ear pain x today. Patient denies fever. Patient has been taking OTC Zyrtec D and Flonase. 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 Patient states her ears feel stopped up.

## 2019-09-06 NOTE — Discharge Instructions (Signed)
Take the amoxicillin as directed.  Take ibuprofen and Mucinex as needed.    Follow up with your primary care provider if your symptoms are not improving.    Your COVID test is pending.  You should self quarantine until the test result is back.

## 2019-09-06 NOTE — ED Provider Notes (Signed)
[name]    CSN: [id number] Arrival date & time: 09/06/19  1612      History   Chief Complaint Chief Complaint  Patient presents with  . Otalgia  . Cough  . Nasal Congestion    HPI Jenny Hendrix is a 39 y.o. female.   Presents with cough and nasal congestion x 3 days.  Today she has bilateral ear pain.  She has attempted treatment at home with Zyrtec and Flonase.  She denies fever, chills, sore throat, shortness of breath, vomiting, diarrhea, rash, or other symptoms.    The history is provided by the patient.    Past Medical History:  Diagnosis Date  . Anemia   . Anxiety   . ASCUS of cervix with negative high risk HPV 05/22/2016  . Depression   . HSV infection   . Hypothyroid   . Hypothyroidism   . Hypoxia 04/2018  . Ovarian cyst   . Pneumonia     Patient Active Problem List   Diagnosis Date Noted  . HCAP (healthcare-associated pneumonia)   . Pulmonary infiltrates   . Abnormal CXR 05/05/2018  . Pneumonitis 05/04/2018  . Acute respiratory failure with hypoxia ([place]) 05/04/2018  . Chronic anemia 05/04/2018  . Thrombocytosis ([place]) 05/04/2018  . Sepsis ([place]) 04/28/2018  . CAP (community acquired pneumonia) 04/28/2018  . Cyst of left ovary 03/10/2018  . Encounter for planned induction of labor 09/05/2017  . Labial lesion 08/13/2017  . Pregnancy 08/13/2017  . Vomiting and diarrhea 06/01/2017  . Anxiety and depression 01/27/2017  . Supervision of high risk pregnancy, antepartum 01/27/2017  . Obesity in pregnancy 01/27/2017  . GERD (gastroesophageal reflux disease) 01/27/2017  . Hypothyroidism 01/27/2017  . Pelvic pain in female 01/12/2017    Past Surgical History:  Procedure Laterality Date  . CESAREAN SECTION N/A 09/06/2017   Procedure: CESAREAN SECTION;  Surgeon: Malachy Mood, MD;  Location: [facility] ORS;  Service: Obstetrics;  Laterality: N/A;  . DILATION AND EVACUATION N/A 06/19/2016   Procedure: DILATATION AND EVACUATION;  Surgeon:  Gae Dry, MD;  Location: [facility] ORS;  Service: Gynecology;  Laterality: N/A;  . TONSILLECTOMY AND ADENOIDECTOMY    . WISDOM TOOTH EXTRACTION      OB History    Gravida  2   Para  1   Term  1   Preterm      AB  1   Living  1     SAB  1   TAB      Ectopic      Multiple  0   Live Births  1            Home Medications    Prior to Admission medications   Medication Sig Start Date End Date Taking? Authorizing Provider  Ascorbic Acid (VITAMIN C) 1000 MG tablet Take 1,000 mg by mouth daily.   Yes [provider]  clonazePAM (KLONOPIN) 1 MG tablet Take 1 mg by mouth 3 (three) times daily as needed for anxiety.  02/02/18  Yes [provider]  D3-50 1.25 MG (50000 UT) capsule Take 50,000 Units by mouth daily. 11/18/18  Yes [provider]  levonorgestrel (MIRENA) 20 MCG/24HR IUD 1 Intra Uterine Device (1 each total) by Intrauterine route once for 1 dose. 03/01/18 [id number] Yes Copland, Deirdre Evener, PA-C  levothyroxine (SYNTHROID) 75 MCG tablet TAKE 1 TABLET BY MOUTH EVERY DAY IN THE MORNING 11/18/18  Yes [provider]  omeprazole (PRILOSEC) 40 MG capsule Take 40 mg  by mouth at bedtime.    Yes [provider]  sertraline (ZOLOFT) 100 MG tablet Take 1 tablet (100 mg total) by mouth daily. Patient taking differently: Take 50 mg by mouth daily with lunch.  11/06/17  Yes Malachy Mood, MD  albuterol (PROAIR HFA) 108 (90 Base) MCG/ACT inhaler Inhale 1-2 puffs into the lungs every 6 (six) hours as needed for wheezing or shortness of breath.    [provider]  amLODipine (NORVASC) 10 MG tablet Take 1 tablet (10 mg total) by mouth daily. 05/11/18   Thurnell Lose, MD  amoxicillin (AMOXIL) 875 MG tablet Take 1 tablet (875 mg total) by mouth 2 (two) times daily for 7 days. 09/06/19 09/13/19  Sharion Balloon, NP  ipratropium-albuterol (DUONEB) 0.5-2.5 (3) MG/3ML SOLN Use twice a day scheduled and every 4 hours as needed for shortness of  breath and wheezing 05/11/18   Thurnell Lose, MD  oxyCODONE (ROXICODONE) 5 MG immediate release tablet Take 1 tablet (5 mg total) by mouth every 6 (six) hours as needed for severe pain. For menstrual pain 11/26/18   Rod Can, CNM    Family History Family History  Problem Relation Age of Onset  . Diabetes Mother   . Depression Mother   . Hyperlipidemia Mother   . Hypertension Mother   . Hyperlipidemia Maternal Grandmother   . Hypertension Maternal Grandmother   . Breast cancer Maternal Grandmother   . Thyroid disease Maternal Grandmother   . Other Father        suicide    Social History Social History   Tobacco Use  . Smoking status: Never Smoker  . Smokeless tobacco: Never Used  Substance Use Topics  . Alcohol use: Yes    Comment: "ocassionally"  . Drug use: No     Allergies   Azithromycin, Benzonatate, Metronidazole, Latex, and Tape   Review of Systems Review of Systems  Constitutional: Negative for chills and fever.  HENT: Positive for congestion and ear pain. Negative for sore throat.   Eyes: Negative for pain and visual disturbance.  Respiratory: Positive for cough. Negative for shortness of breath.   Cardiovascular: Negative for chest pain and palpitations.  Gastrointestinal: Negative for abdominal pain, diarrhea, nausea and vomiting.  Genitourinary: Negative for dysuria and hematuria.  Musculoskeletal: Negative for arthralgias and back pain.  Skin: Negative for color change and rash.  Neurological: Negative for seizures and syncope.  All other systems reviewed and are negative.    Physical Exam Triage Vital Signs ED Triage Vitals  Enc Vitals Group     BP      Pulse      Resp      Temp      Temp src      SpO2      Weight      Height      Head Circumference      Peak Flow      Pain Score      Pain Loc      Pain Edu?      Excl. in [place]?    No data found.  Updated Vital Signs BP 127/88 (BP Location: Left Arm)   Pulse 82   Temp 98.6  F (37 C) (Oral)   Resp 18   Ht 5\' 3"  (1.6 m)   Wt 220 lb (99.8 kg)   SpO2 95%   BMI 38.97 kg/m   Visual Acuity Right Eye Distance:   Left Eye Distance:   Bilateral Distance:  Right Eye Near:   Left Eye Near:    Bilateral Near:     Physical Exam Vitals and nursing note reviewed.  Constitutional:      General: She is not in acute distress.    Appearance: She is well-developed.  HENT:     Head: Normocephalic and atraumatic.     Right Ear: Tympanic membrane is erythematous.     Left Ear: Tympanic membrane is erythematous.     Nose: Congestion present.     Mouth/Throat:     Mouth: Mucous membranes are moist.     Pharynx: Oropharynx is clear.  Eyes:     Conjunctiva/sclera: Conjunctivae normal.  Cardiovascular:     Rate and Rhythm: Normal rate and regular rhythm.     Heart sounds: No murmur.  Pulmonary:     Effort: Pulmonary effort is normal. No respiratory distress.     Breath sounds: Normal breath sounds.  Abdominal:     Palpations: Abdomen is soft.     Tenderness: There is no abdominal tenderness. There is no guarding or rebound.  Musculoskeletal:     Cervical back: Neck supple.  Skin:    General: Skin is warm and dry.     Findings: No rash.  Neurological:     General: No focal deficit present.     Mental Status: She is alert and oriented to person, place, and time.  Psychiatric:        Mood and Affect: Mood normal.        Behavior: Behavior normal.      UC Treatments / Results  Labs (all labs ordered are listed, but only abnormal results are displayed) Labs Reviewed  NOVEL CORONAVIRUS, NAA    EKG   Radiology No results found.  Procedures Procedures (including critical care time)  Medications Ordered in UC Medications - No data to display  Initial Impression / Assessment and Plan / UC Course  I have reviewed the triage vital signs and the nursing notes.  Pertinent labs & imaging results that were available during my care of the patient  were reviewed by me and considered in my medical decision making (see chart for details).   Bilateral otitis media, cough.  Treating with amoxicillin, ibuprofen, Mucinex.  PCR COVID performed here.  Patient instructed to self quarantine until test results back.  Instructed patient to follow-up with her PCP if her symptoms or not improving.  Patient agrees to plan of care.     Final Clinical Impressions(s) / UC Diagnoses   Final diagnoses:  Cough  Bilateral otitis media, unspecified otitis media type     Discharge Instructions     Take the amoxicillin as directed.  Take ibuprofen and Mucinex as needed.    Follow up with your primary care provider if your symptoms are not improving.    Your COVID test is pending.  You should self quarantine until the test result is back.        ED Prescriptions    Medication Sig Dispense Auth. Provider   amoxicillin (AMOXIL) 875 MG tablet Take 1 tablet (875 mg total) by mouth 2 (two) times daily for 7 days. 14 tablet Sharion Balloon, NP     PDMP not reviewed this encounter.   Sharion Balloon, NP 09/06/19 [phone]

## 2019-09-07 LAB — NOVEL CORONAVIRUS, NAA: SARS-CoV-2, NAA: NOT DETECTED

## 2019-09-07 LAB — SARS-COV-2, NAA 2 DAY TAT

## 2019-12-13 IMAGING — CT CT ANGIO CHEST
2 of 8 series · 18 of 46 positions shown · IV contrast (iopamidol)
Comparison: Chest radiograph earlier this day. Chest CT 5 days
prior 04/29/2018 at [REDACTED].

CLINICAL DATA: Chest pain, complex, intermediate/high prob of
ACS/PE/AAS. Cough and shortness of breath.

EXAM:
CT ANGIOGRAPHY CHEST WITH CONTRAST
TECHNIQUE: Multidetector CT imaging of the chest was performed using the
standard protocol during bolus administration of intravenous
contrast. Multiplanar CT image reconstructions and MIPs were
obtained to evaluate the vascular anatomy.
CONTRAST:  65mL ARCUEA-CVH IOPAMIDOL (ARCUEA-CVH) INJECTION 76%

[Series 6: thins · axial · 0.66mm/px · z∈[-275,-21]mm · 15 of 280 slices shown]
[im 13/280  lung]
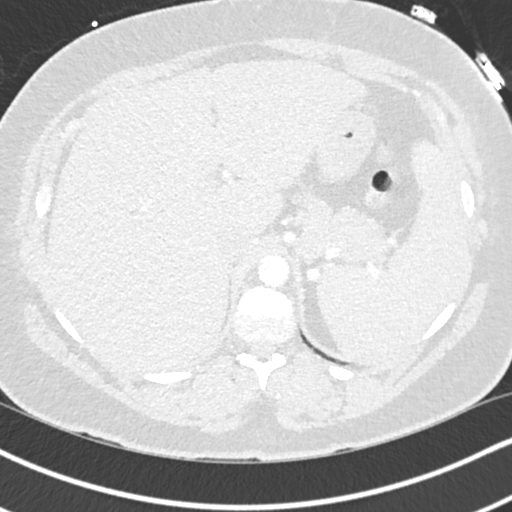
[im 39/280  soft-tissue]
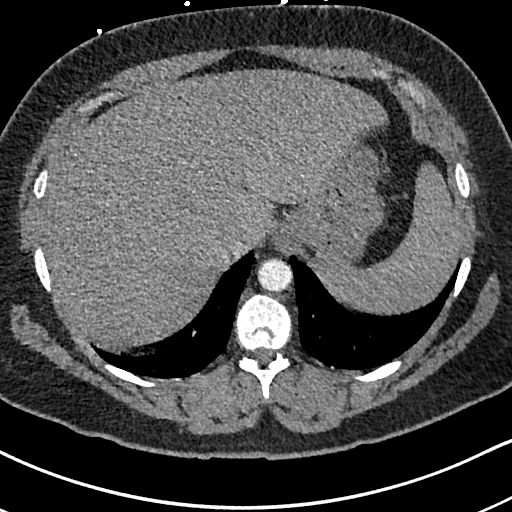
[im 51/280  lung]
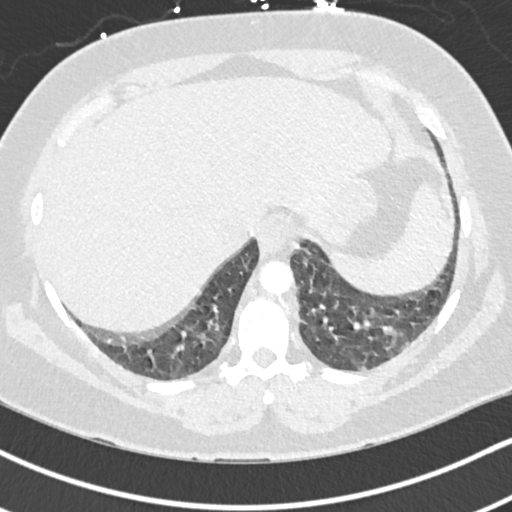
[im 64/280  soft-tissue]
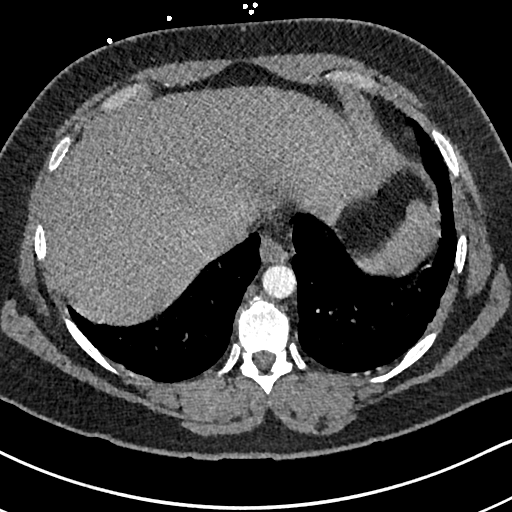
[im 89/280  lung]
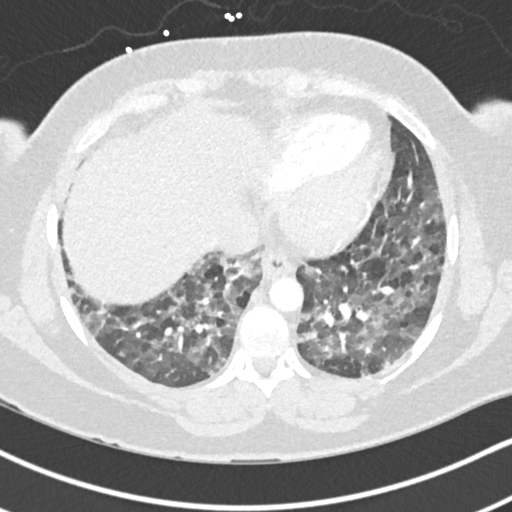
[im 102/280  soft-tissue]
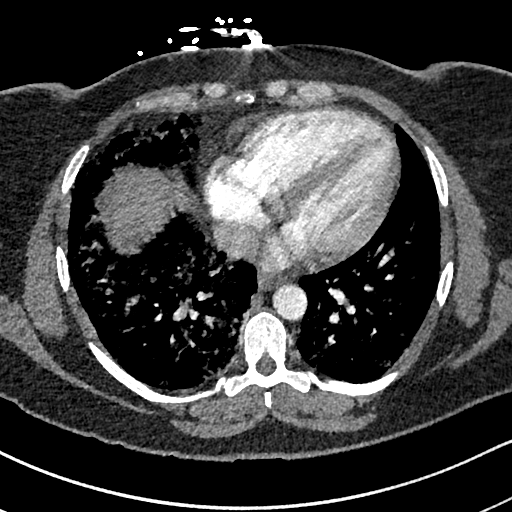
[im 127/280  lung]
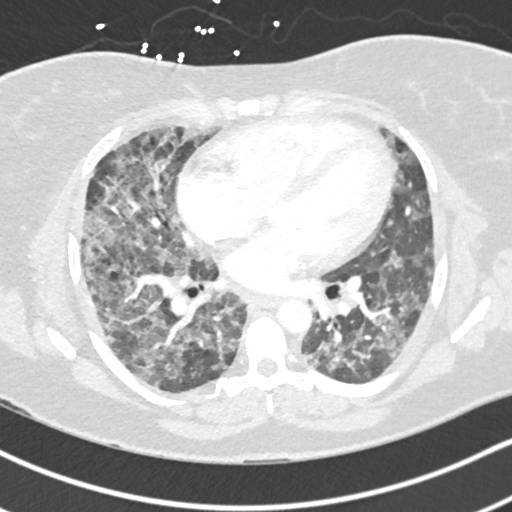
[im 140/280  soft-tissue]
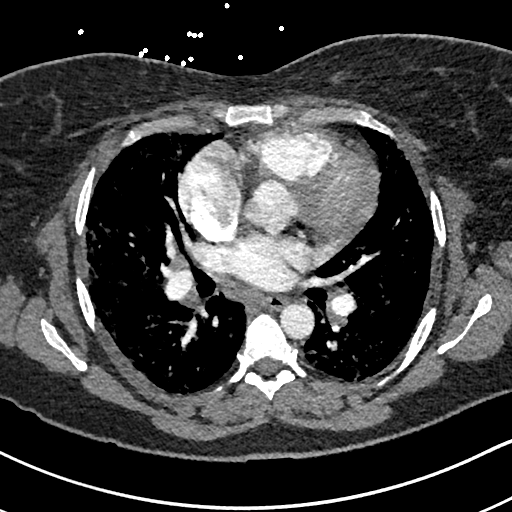
[im 153/280  lung]
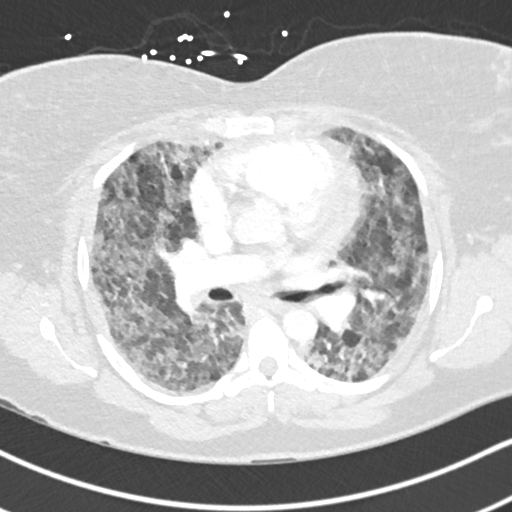
[im 178/280  soft-tissue]
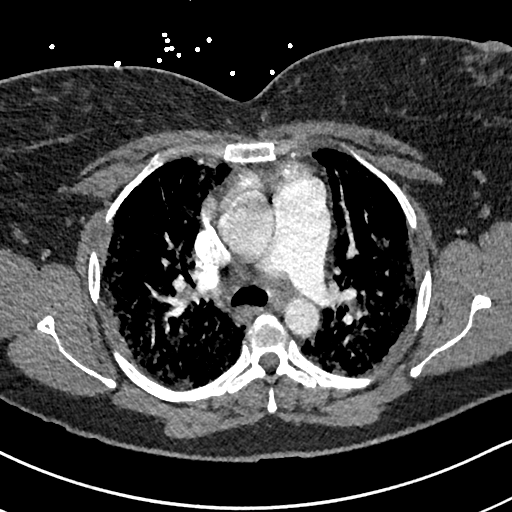
[im 191/280  lung]
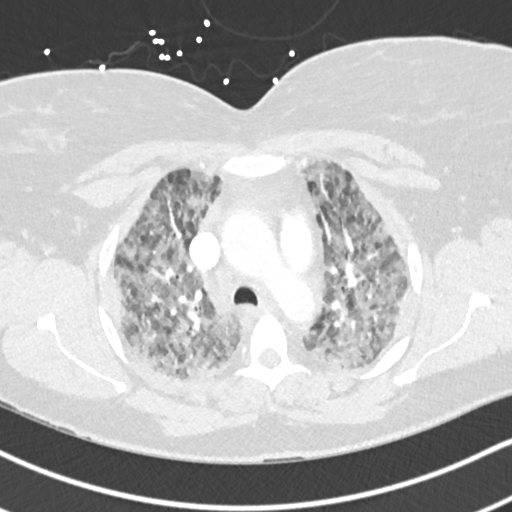
[im 216/280  soft-tissue]
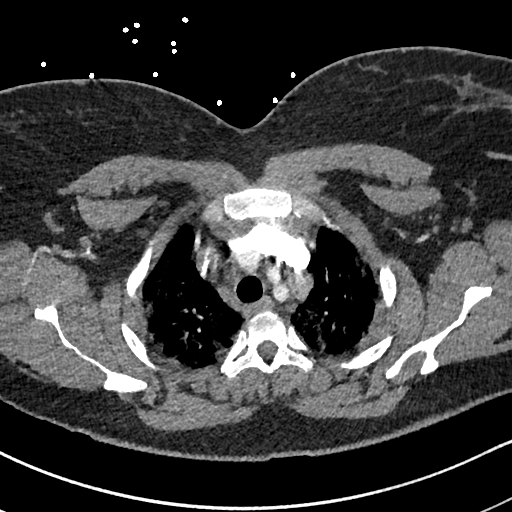
[im 229/280  lung]
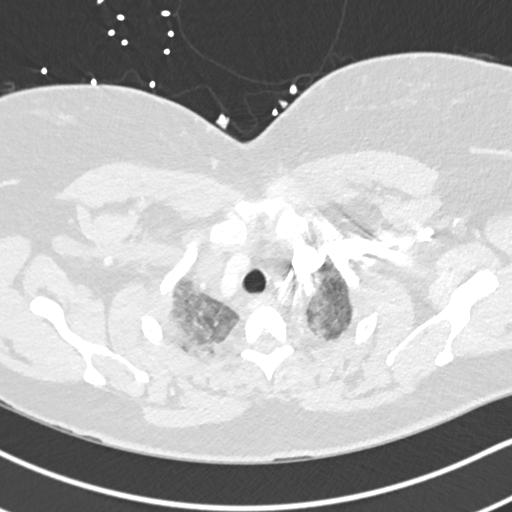
[im 241/280  soft-tissue]
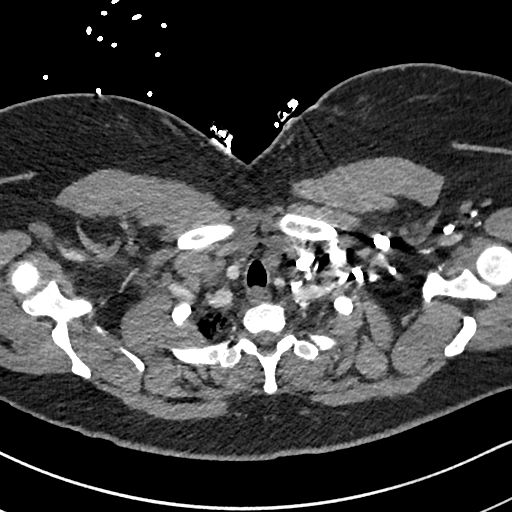
[im 267/280  lung]
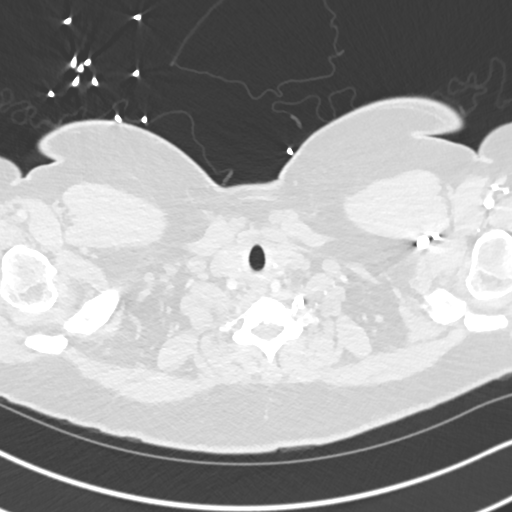

[Series 8: coronal mpr · coronal · 0.54mm/px · 3 of 151 slices shown]
[im 38/151  soft-tissue]
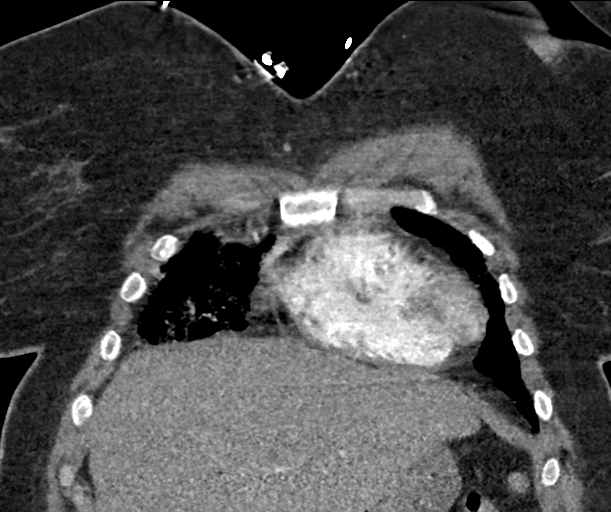
[im 76/151  soft-tissue]
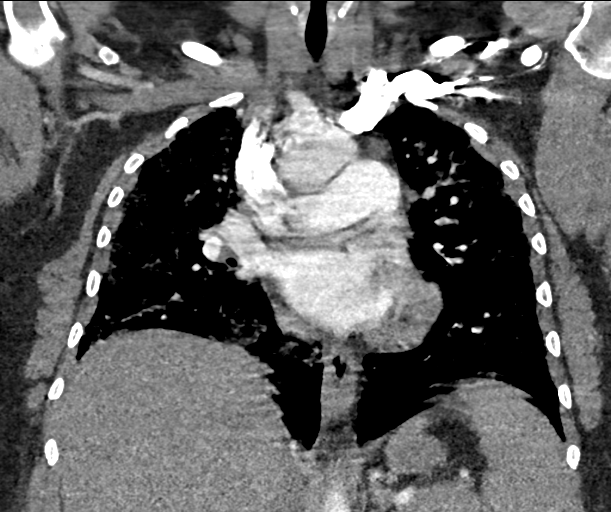
[im 113/151  soft-tissue]
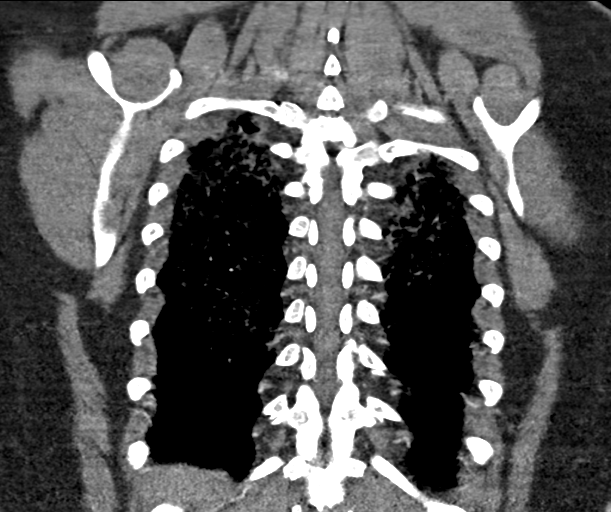

[18 of 46 positions shown; findings below may reference images not displayed]

FINDINGS: Cardiovascular: There are no filling defects within the pulmonary
arteries to suggest pulmonary embolus. Ascending aorta measures
cm on the current exam, cardiac motion artifact limits more detailed
assessment. No aortic dissection. Heart is normal in size. No
pericardial effusion.

Mediastinum/Nodes: Mild mediastinal and right greater than left
hilar adenopathy, index node in the right paratracheal region
measures 1.5 cm short axis. Findings are similar to recent prior.
Esophagus is decompressed. No visualized thyroid nodule.

Lungs/Pleura: Progression in diffuse ground-glass opacities
throughout both lungs, with slight basilar sparing. Developing air
bronchograms in the upper lobe centrally. No definite septal
thickening. No pleural fluid. No pneumothorax. Trachea and mainstem
bronchi are patent.

Upper Abdomen: Suggestion of hepatosplenomegaly, partially included.
No acute findings.

Musculoskeletal: There are no acute or suspicious osseous
abnormalities.

Review of the MIP images confirms the above findings.
IMPRESSION: 1. No pulmonary embolus.
2. Progression of diffuse ground-glass opacities throughout both
lungs since CT 5 days ago, with slight basilar sparing. Developing
air bronchograms in the upper lobes. Differential considerations are
broad and findings may be secondary to atypical infection, acute
interstitial pneumonia, ARDS, or pulmonary hemorrhage. Atypical
pulmonary edema is also considered.
3. Mild mediastinal and hilar adenopathy is similar to recent prior
exam, likely reactive.
4. Ectatic ascending thoracic aorta on prior exam is not as well
demonstrated on the current exam, motion artifact obscured.

## 2019-12-13 IMAGING — DX DG CHEST 1V PORT
1 series · 1 of 1 positions shown · non-contrast
Comparison: 06/25/2004

CLINICAL DATA: Cough and shortness of breath for 2 weeks. Recent
pneumonia.

EXAM:
PORTABLE CHEST 1 VIEW

[chest ap]
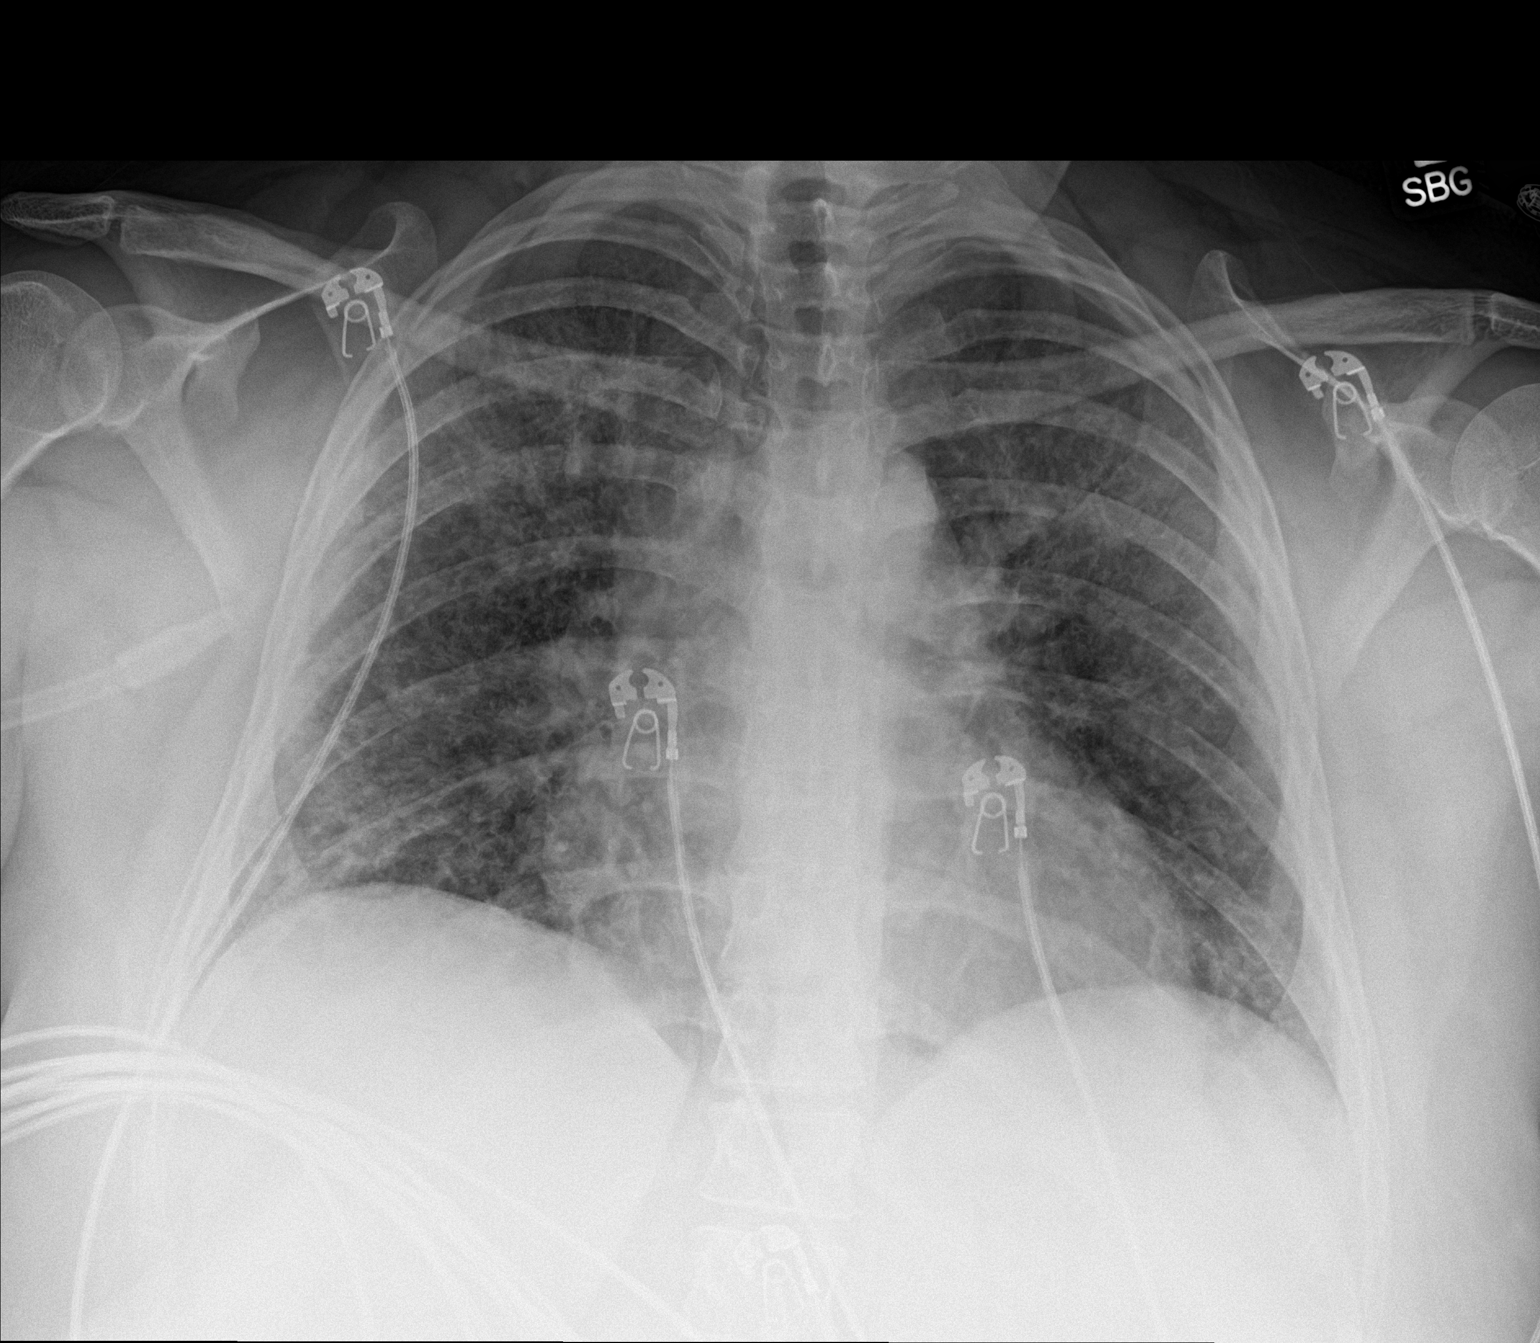

[1 of 1 positions shown; findings below may reference images not displayed]

FINDINGS: Heart size is within normal limits. Mild diffuse heterogeneous
airspace disease is seen, which may be due to edema, atypical
infection, or less likely hemorrhage. No evidence of pneumothorax or
pleural effusion.
IMPRESSION: Mild diffuse heterogeneous bilateral airspace disease. Differential
diagnosis includes edema, atypical infection, or less likely
hemorrhage.

## 2020-04-23 ENCOUNTER — Other Ambulatory Visit: Payer: Self-pay

## 2020-04-23 ENCOUNTER — Other Ambulatory Visit (HOSPITAL_COMMUNITY)
Admission: RE | Admit: 2020-04-23 | Discharge: 2020-04-23 | Disposition: A | Discharge status: Home / Self Care | Payer: BC Managed Care – PPO | Source: Ambulatory Visit | Attending: Obstetrics and Gynecology | Admitting: Obstetrics and Gynecology

## 2020-04-23 ENCOUNTER — Encounter: Payer: Self-pay | Admitting: Obstetrics and Gynecology

## 2020-04-23 ENCOUNTER — Ambulatory Visit (INDEPENDENT_AMBULATORY_CARE_PROVIDER_SITE_OTHER): Payer: BC Managed Care – PPO | Admitting: Obstetrics and Gynecology

## 2020-04-23 VITALS — BP 122/74 | HR 94 | Ht 63.0 in | Wt 234.0 lb

## 2020-04-23 DIAGNOSIS — Z01419 Encounter for gynecological examination (general) (routine) without abnormal findings: Secondary | ICD-10-CM | POA: Diagnosis not present

## 2020-04-23 DIAGNOSIS — Z0000 Encounter for general adult medical examination without abnormal findings: Secondary | ICD-10-CM | POA: Diagnosis not present

## 2020-04-23 DIAGNOSIS — Z124 Encounter for screening for malignant neoplasm of cervix: Secondary | ICD-10-CM

## 2020-04-23 DIAGNOSIS — L988 Other specified disorders of the skin and subcutaneous tissue: Secondary | ICD-10-CM

## 2020-04-23 DIAGNOSIS — Z113 Encounter for screening for infections with a predominantly sexual mode of transmission: Secondary | ICD-10-CM | POA: Diagnosis present

## 2020-04-23 DIAGNOSIS — N649 Disorder of breast, unspecified: Secondary | ICD-10-CM

## 2020-04-23 NOTE — Progress Notes (Signed)
Gynecology Annual Exam  PCP: Armando Gang, FNP  Chief Complaint:  Chief Complaint  Patient presents with  . Gynecologic Exam    New lump in left breast x2 months    History of Present Illness: Patient is a 39 y.o. G2P1011 presents for annual exam. The patient has no complaints today.   LMP: No LMP recorded. (Menstrual status: IUD). Average Interval: irregular, not applicable days Duration of flow: 1 days Heavy Menses: no Intermenstrual Bleeding: no Dysmenorrhea: no  The patient is sexually active. She denies dyspareunia.  Postcoital Bleeding: no She currently uses none for contraception.    The patient does perform self breast exams.  There is notable family history of breast or ovarian cancer in her family. She complains that her left breast has a skin lesion which has ben squirting out bright red blood. She does pick at the skin lesion.   The patient has regular exercise: denies   Review of Systems: Review of Systems  Constitutional: Negative for chills, fever, malaise/fatigue and weight loss.  HENT: Negative for congestion, hearing loss and sinus pain.   Eyes: Negative for blurred vision and double vision.  Respiratory: Negative for cough, sputum production, shortness of breath and wheezing.   Cardiovascular: Negative for chest pain, palpitations, orthopnea and leg swelling.  Gastrointestinal: Negative for abdominal pain, constipation, diarrhea, nausea and vomiting.  Genitourinary: Negative for dysuria, flank pain, frequency, hematuria and urgency.  Musculoskeletal: Negative for back pain, falls and joint pain.  Skin: Negative for itching and rash.  Neurological: Negative for dizziness and headaches.  Psychiatric/Behavioral: Negative for depression, substance abuse and suicidal ideas. The patient is not nervous/anxious.     Past Medical History:  Past Medical History:  Diagnosis Date  . Anemia   . Anxiety   . ASCUS of cervix with negative high risk HPV  05/22/2016  . Depression   . HSV infection   . Hypothyroid   . Hypothyroidism   . Hypoxia 04/2018  . Ovarian cyst   . Pneumonia   . Sleep apnea     Past Surgical History:  Past Surgical History:  Procedure Laterality Date  . CESAREAN SECTION N/A 09/06/2017   Procedure: CESAREAN SECTION;  Surgeon: Vena Austria, MD;  Location: [facility] ORS;  Service: Obstetrics;  Laterality: N/A;  . DILATION AND EVACUATION N/A 06/19/2016   Procedure: DILATATION AND EVACUATION;  Surgeon: Nadara Mustard, MD;  Location: [facility] ORS;  Service: Gynecology;  Laterality: N/A;  . TONSILLECTOMY AND ADENOIDECTOMY    . WISDOM TOOTH EXTRACTION      Gynecologic History:  No LMP recorded. (Menstrual status: IUD). Contraception: IUD- reports strings are lost but has had [place] which showed placement in the uterus Last Pap: Results were: 2018 NIL   Obstetric History: G2P1011  Family History:  Family History  Problem Relation Age of Onset  . Diabetes Mother   . Depression Mother   . Hyperlipidemia Mother   . Hypertension Mother   . Hyperlipidemia Maternal Grandmother   . Hypertension Maternal Grandmother   . Breast cancer Maternal Grandmother   . Thyroid disease Maternal Grandmother   . Colon cancer Maternal Grandmother 64  . Other Father        suicide    Social History:  Social History   Socioeconomic History  . Marital status: Married    Spouse name: Brienne Liguori  . Number of children: 1  . Years of education: Not on file  . Highest education level: Not on file  Occupational History  . Occupation: [employer]  Tobacco Use  . Smoking status: Never Smoker  . Smokeless tobacco: Never Used  Vaping Use  . Vaping Use: Never used  Substance and Sexual Activity  . Alcohol use: Yes    Comment: "ocassionally"  . Drug use: No  . Sexual activity: Yes    Birth control/protection: I.U.D.    Comment: Mirena  Other Topics Concern  . Not on file  Social History Narrative  . Not on file    Social Determinants of Health   Financial Resource Strain: Not on file  Food Insecurity: Not on file  Transportation Needs: Not on file  Physical Activity: Not on file  Stress: Not on file  Social Connections: Not on file  Intimate Partner Violence: Not on file    Allergies:  Allergies  Allergen Reactions  . Azithromycin Nausea And Vomiting    Vomits if received via IV  . Benzonatate Diarrhea  . Metronidazole Other (See Comments)    Body aches, all-over side effects   . Latex Rash    NO POWDERED GLOVES!!  . Tape Rash    No Band-Aids!!    Medications: Prior to Admission medications   Medication Sig Start Date End Date Taking? Authorizing Provider  amLODipine (NORVASC) 10 MG tablet Take 1 tablet (10 mg total) by mouth daily. 05/11/18  Yes Thurnell Lose, MD  Ascorbic Acid (VITAMIN C) 1000 MG tablet Take 1,000 mg by mouth daily.   Yes [provider]  clonazePAM (KLONOPIN) 1 MG tablet Take 1 mg by mouth 3 (three) times daily as needed for anxiety.  02/02/18  Yes [provider]  Cyanocobalamin (VITAMIN B 12 PO) Take by mouth.   Yes [provider]  levothyroxine (SYNTHROID) 75 MCG tablet TAKE 1 TABLET BY MOUTH EVERY DAY IN THE MORNING 11/18/18  Yes [provider]  milk thistle 175 MG tablet Take 175 mg by mouth daily.   Yes [provider]  sertraline (ZOLOFT) 100 MG tablet Take 1 tablet (100 mg total) by mouth daily. Patient taking differently: Take 50 mg by mouth daily with lunch. 11/06/17  Yes Malachy Mood, MD  albuterol (VENTOLIN HFA) 108 (90 Base) MCG/ACT inhaler Inhale 1-2 puffs into the lungs every 6 (six) hours as needed for wheezing or shortness of breath. Patient not taking: Reported on 04/23/2020    [provider]  D3-50 1.25 MG (50000 UT) capsule Take 50,000 Units by mouth daily. Patient not taking: Reported on 04/23/2020 11/18/18   [provider]  ipratropium-albuterol (DUONEB) 0.5-2.5 (3)  MG/3ML SOLN Use twice a day scheduled and every 4 hours as needed for shortness of breath and wheezing Patient not taking: Reported on 04/23/2020 05/11/18   Thurnell Lose, MD  levonorgestrel (MIRENA) 20 MCG/24HR IUD 1 Intra Uterine Device (1 each total) by Intrauterine route once for 1 dose. 03/01/18 [id number]  Copland, Deirdre Evener, PA-C  omeprazole (PRILOSEC) 40 MG capsule Take 40 mg by mouth at bedtime.  Patient not taking: Reported on 04/23/2020    [provider]  oxyCODONE (ROXICODONE) 5 MG immediate release tablet Take 1 tablet (5 mg total) by mouth every 6 (six) hours as needed for severe pain. For menstrual pain Patient not taking: Reported on 04/23/2020 11/26/18   Rod Can, CNM    Physical Exam Vitals: Blood pressure 122/74, pulse 94, height 5\' 3"  (1.6 m), weight 234 lb (106.1 kg).  Physical Exam Constitutional:      Appearance: She is well-developed.  Genitourinary:     Vagina and uterus normal.     There is no lesion on the right labia.     There is no lesion on the left labia.    No lesions in the vagina.     Genitourinary Comments: External: Normal appearing vulva. No lesions noted.  Speculum examination: Normal appearing cervix. No blood in the vaginal vault. no discharge.   IUD strings absent Bimanual examination: Uterus midline, non-tender, normal in size, shape and contour.  No CMT. No adnexal masses. No adnexal tenderness. Pelvis not fixed.       Right Adnexa: no mass present.    Left Adnexa: no mass present.    No cervical motion tenderness.  Breasts:     Right: No inverted nipple, mass, nipple discharge or skin change.     Left: No inverted nipple, mass, nipple discharge or skin change.    HENT:     Head: Normocephalic and atraumatic.  Eyes:     Extraocular Movements: EOM normal.  Neck:     Thyroid: No thyromegaly.  Cardiovascular:     Rate and Rhythm: Normal rate and regular rhythm.     Heart sounds: Normal heart sounds.  Pulmonary:     Effort:  Pulmonary effort is normal.     Breath sounds: Normal breath sounds.  Abdominal:     General: Bowel sounds are normal. There is no distension.     Palpations: Abdomen is soft. There is no mass.  Musculoskeletal:     Cervical back: Neck supple.  Neurological:     Mental Status: She is alert and oriented to person, place, and time.  Skin:    General: Skin is warm and dry.  Psychiatric:        Mood and Affect: Mood and affect normal.        Behavior: Behavior normal.        Thought Content: Thought content normal.        Judgment: Judgment normal.  Vitals reviewed. Exam conducted with a chaperone present.    Left breast had small papule at 8 o'clock on the areola.     Female chaperone present for pelvic and breast  portions of the physical exam  Assessment: 39 y.o. G2P1011 routine annual exam  Plan: Problem List Items Addressed This Visit   None   Visit Diagnoses    Encounter for annual routine gynecological examination    -  Primary   Health maintenance examination       Encounter for gynecological examination without abnormal finding       Screen for STD (sexually transmitted disease)       Relevant Orders   Cytology - PAP   HIV antibody (with reflex)   RPR   Hepatitis panel, acute   Cervical cancer screening       Relevant Orders   Cytology - PAP   Lesion of skin of breast       Relevant Orders   Ambulatory referral to General Surgery      1) STI screening was offered and accepted  2) ASCCP guidelines and rational discussed.  Patient opts for every 3 years screening interval  3) Contraception -desires to continue using IUD.  4) Breast skin lesion- referral to breast surgery for care and evaluation  5) Routine healthcare maintenance including cholesterol, diabetes screening discussed managed by PCP   Adrian Prows MD, [place], [facility] 04/23/2020 3:09 PM

## 2020-04-23 NOTE — Patient Instructions (Addendum)
Institute of Medicine Recommended Dietary Allowances for Calcium and Vitamin D  Age (yr) Calcium Recommended Dietary Allowance (mg/day) Vitamin D Recommended Dietary Allowance (international units/day)  9-18 1,300 600  19-50 1,000 600  51-70 1,200 600  71 and older 1,200 800  Data from Institute of Medicine. Dietary reference intakes: calcium, vitamin D. [place], [place]: National Academies Press; 2011.     Exercising to Stay Healthy To become healthy and stay healthy, it is recommended that you do moderate-intensity and vigorous-intensity exercise. You can tell that you are exercising at a moderate intensity if your heart starts beating faster and you start breathing faster but can still hold a conversation. You can tell that you are exercising at a vigorous intensity if you are breathing much harder and faster and cannot hold a conversation while exercising. Exercising regularly is important. It has many health benefits, such as:  Improving overall fitness, flexibility, and endurance.  Increasing bone density.  Helping with weight control.  Decreasing body fat.  Increasing muscle strength.  Reducing stress and tension.  Improving overall health. How often should I exercise? Choose an activity that you enjoy, and set realistic goals. Your health care provider can help you make an activity plan that works for you. Exercise regularly as told by your health care provider. This may include:  Doing strength training two times a week, such as: ? Lifting weights. ? Using resistance bands. ? Push-ups. ? Sit-ups. ? Yoga.  Doing a certain intensity of exercise for a given amount of time. Choose from these options: ? A total of 150 minutes of moderate-intensity exercise every week. ? A total of 75 minutes of vigorous-intensity exercise every week. ? A mix of moderate-intensity and vigorous-intensity exercise every week. Children, pregnant women, people who have not exercised  regularly, people who are overweight, and older adults may need to talk with a health care provider about what activities are safe to do. If you have a medical condition, be sure to talk with your health care provider before you start a new exercise program. What are some exercise ideas? Moderate-intensity exercise ideas include:  Walking 1 mile (1.6 km) in about 15 minutes.  Biking.  Hiking.  Golfing.  Dancing.  Water aerobics. Vigorous-intensity exercise ideas include:  Walking 4.5 miles (7.2 km) or more in about 1 hour.  Jogging or running 5 miles (8 km) in about 1 hour.  Biking 10 miles (16.1 km) or more in about 1 hour.  Lap swimming.  Roller-skating or in-line skating.  Cross-country skiing.  Vigorous competitive sports, such as football, basketball, and soccer.  Jumping rope.  Aerobic dancing. What are some everyday activities that can help me to get exercise?  Yard work, such as: ? Pushing a lawn mower. ? Raking and bagging leaves.  Washing your car.  Pushing a stroller.  Shoveling snow.  Gardening.  Washing windows or floors. How can I be more active in my day-to-day activities?  Use stairs instead of an elevator.  Take a walk during your lunch break.  If you drive, park your car farther away from your work or school.  If you take public transportation, get off one stop early and walk the rest of the way.  Stand up or walk around during all of your indoor phone calls.  Get up, stretch, and walk around every 30 minutes throughout the day.  Enjoy exercise with a friend. Support to continue exercising will help you keep a regular routine of activity. What guidelines can   I follow while exercising?  Before you start a new exercise program, talk with your health care provider.  Do not exercise so much that you hurt yourself, feel dizzy, or get very short of breath.  Wear comfortable clothes and wear shoes with good support.  Drink plenty of  water while you exercise to prevent dehydration or heat stroke.  Work out until your breathing and your heartbeat get faster. Where to find more information  U.S. Department of Health and Human Services: www.hhs.gov  Centers for Disease Control and Prevention (CDC): www.cdc.gov Summary  Exercising regularly is important. It will improve your overall fitness, flexibility, and endurance.  Regular exercise also will improve your overall health. It can help you control your weight, reduce stress, and improve your bone density.  Do not exercise so much that you hurt yourself, feel dizzy, or get very short of breath.  Before you start a new exercise program, talk with your health care provider. This information is not intended to replace advice given to you by your health care provider. Make sure you discuss any questions you have with your health care provider. Document Revised: 03/27/2017 Document Reviewed: 03/05/2017 Elsevier Patient Education  2020 Elsevier Inc.   Budget-Friendly Healthy Eating There are many ways to save money at the grocery store and continue to eat healthy. You can be successful if you:  Plan meals according to your budget.  Make a grocery list and only purchase food according to your grocery list.  Prepare food yourself. What are tips for following this plan?  Reading food labels  Compare food labels between brand name foods and the store brand. Often the nutritional value is the same, but the store brand is lower cost.  Look for products that do not have added sugar, fat, or salt (sodium). These often cost the same but are healthier for you. Products may be labeled as: ? Sugar-free. ? Nonfat. ? Low-fat. ? Sodium-free. ? Low-sodium.  Look for lean ground beef labeled as at least 92% lean and 8% fat. Shopping  Buy only the items on your grocery list and go only to the areas of the store that have the items on your list.  Use coupons only for foods  and brands you normally buy. Avoid buying items you wouldn't normally buy simply because they are on sale.  Check online and in newspapers for weekly deals.  Buy healthy items from the bulk bins when available, such as herbs, spices, flour, pasta, nuts, and dried fruit.  Buy fruits and vegetables that are in season. Prices are usually lower on in-season produce.  Look at the unit price on the price tag. Use it to compare different brands and sizes to find out which item is the best deal.  Choose healthy items that are often low-cost, such as carrots, potatoes, apples, bananas, and oranges. Dried or canned beans are a low-cost protein source.  Buy in bulk and freeze extra food. Items you can buy in bulk include meats, fish, poultry, frozen fruits, and frozen vegetables.  Avoid buying "ready-to-eat" foods, such as pre-cut fruits and vegetables and pre-made salads.  If possible, shop around to discover where you can find the best prices. Consider other retailers such as dollar stores, larger wholesale stores, local fruit and vegetable stands, and farmers markets.  Do not shop when you are hungry. If you shop while hungry, it may be hard to stick to your list and budget.  Resist impulse buying. Use your grocery list as   your official plan for the week.  Buy a variety of vegetables and fruits by purchasing fresh, frozen, and canned items.  Look at the top and bottom shelves for deals. Foods at eye level (eye level of an adult or child) are usually more expensive.  Be efficient with your time when shopping. The more time you spend at the store, the more money you are likely to spend.  To save money when choosing more expensive foods like meats and dairy: ? Choose cheaper cuts of meat, such as bone-in chicken thighs and drumsticks instead of skinless and boneless chicken. When you are ready to prepare the chicken, you can remove the skin yourself to make it healthier. ? Choose lean meats like  chicken or turkey instead of beef. ? Choose canned seafood, such as tuna, salmon, or sardines. ? Buy eggs as a low-cost source of protein. ? Buy dried beans and peas, such as lentils, split peas, or kidney beans instead of meats. Dried beans and peas are a good alternative source of protein. ? Buy the larger tubs of yogurt instead of individual-sized containers.  Choose water instead of sodas and other sweetened beverages.  Avoid buying chips, cookies, and other "junk food." These items are usually expensive and not healthy. Cooking  Make extra food and freeze the extras in meal-sized containers or in individual portions for fast meals and snacks.  Pre-cook on days when you have extra time to prepare meals in advance. You can keep these meals in the fridge or freezer and reheat for a quick meal.  When you come home from the grocery store, wash, peel, and cut fruits and vegetables so they are ready to use and eat. This will help reduce food waste. Meal planning  Do not eat out or get fast food. Prepare food at home.  Make a grocery list and make sure to bring it with you to the store. If you have a smart phone, you could use your phone to create your shopping list.  Plan meals and snacks according to a grocery list and budget you create.  Use leftovers in your meal plan for the week.  Look for recipes where you can cook once and make enough food for two meals.  Include budget-friendly meals like stews, casseroles, and stir-fry dishes.  Try some meatless meals or try "no cook" meals like salads.  Make sure that half your plate is filled with fruits or vegetables. Choose from fresh, frozen, or canned fruits and vegetables. If eating canned, remember to rinse them before eating. This will remove any excess salt added for packaging. Summary  Eating healthy on a budget is possible if you plan your meals according to your budget, purchase according to your budget and grocery list, and  prepare food yourself.  Tips for buying more food on a limited budget include buying generic brands, using coupons only for foods you normally buy, and buying healthy items from the bulk bins when available.  Tips for buying cheaper food to replace expensive food include choosing cheaper, lean cuts of meat, and buying dried beans and peas. This information is not intended to replace advice given to you by your health care provider. Make sure you discuss any questions you have with your health care provider. Document Revised: 04/15/2017 Document Reviewed: 04/15/2017 Elsevier Patient Education  2020 Elsevier Inc.   Bone Health Bones protect organs, store calcium, anchor muscles, and support the whole body. Keeping your bones strong is important, especially as you   get older. You can take actions to help keep your bones strong and healthy. Why is keeping my bones healthy important?  Keeping your bones healthy is important because your body constantly replaces bone cells. Cells get old, and new cells take their place. As we age, we lose bone cells because the body may not be able to make enough new cells to replace the old cells. The amount of bone cells and bone tissue you have is referred to as bone mass. The higher your bone mass, the stronger your bones. The aging process leads to an overall loss of bone mass in the body, which can increase the likelihood of:  Joint pain and stiffness.  Broken bones.  A condition in which the bones become weak and brittle (osteoporosis). A large decline in bone mass occurs in older adults. In women, it occurs about the time of menopause. What actions can I take to keep my bones healthy? Good health habits are important for maintaining healthy bones. This includes eating nutritious foods and exercising regularly. To have healthy bones, you need to get enough of the right minerals and vitamins. Most nutrition experts recommend getting these nutrients from the  foods that you eat. In some cases, taking supplements may also be recommended. Doing certain types of exercise is also important for bone health. What are the nutritional recommendations for healthy bones?  Eating a well-balanced diet with plenty of calcium and vitamin D will help to protect your bones. Nutritional recommendations vary from person to person. Ask your health care provider what is healthy for you. Here are some general guidelines. Get enough calcium Calcium is the most important (essential) mineral for bone health. Most people can get enough calcium from their diet, but supplements may be recommended for people who are at risk for osteoporosis. Good sources of calcium include:  Dairy products, such as low-fat or nonfat milk, cheese, and yogurt.  Dark green leafy vegetables, such as bok choy and broccoli.  Calcium-fortified foods, such as orange juice, cereal, bread, soy beverages, and tofu products.  Nuts, such as almonds. Follow these recommended amounts for daily calcium intake:  Children, age 1-3: 700 mg.  Children, age 4-8: 1,000 mg.  Children, age 9-13: 1,300 mg.  Teens, age 14-18: 1,300 mg.  Adults, age 19-50: 1,000 mg.  Adults, age 51-70: ? Men: 1,000 mg. ? Women: 1,200 mg.  Adults, age 71 or older: 1,200 mg.  Pregnant and breastfeeding females: ? Teens: 1,300 mg. ? Adults: 1,000 mg. Get enough vitamin D Vitamin D is the most essential vitamin for bone health. It helps the body absorb calcium. Sunlight stimulates the skin to make vitamin D, so be sure to get enough sunlight. If you live in a cold climate or you do not get outside often, your health care provider may recommend that you take vitamin D supplements. Good sources of vitamin D in your diet include:  Egg yolks.  Saltwater fish.  Milk and cereal fortified with vitamin D. Follow these recommended amounts for daily vitamin D intake:  Children and teens, age 1-18: 600 international  units.  Adults, age 50 or younger: 400-800 international units.  Adults, age 51 or older: 800-1,000 international units. Get other important nutrients Other nutrients that are important for bone health include:  Phosphorus. This mineral is found in meat, poultry, dairy foods, nuts, and legumes. The recommended daily intake for adult men and adult women is 700 mg.  Magnesium. This mineral is found in seeds, nuts, dark   green vegetables, and legumes. The recommended daily intake for adult men is 400-420 mg. For adult women, it is 310-320 mg.  Vitamin K. This vitamin is found in green leafy vegetables. The recommended daily intake is 120 mg for adult men and 90 mg for adult women. What type of physical activity is best for building and maintaining healthy bones? Weight-bearing and strength-building activities are important for building and maintaining healthy bones. Weight-bearing activities cause muscles and bones to work against gravity. Strength-building activities increase the strength of the muscles that support bones. Weight-bearing and muscle-building activities include:  Walking and hiking.  Jogging and running.  Dancing.  Gym exercises.  Lifting weights.  Tennis and racquetball.  Climbing stairs.  Aerobics. Adults should get at least 30 minutes of moderate physical activity on most days. Children should get at least 60 minutes of moderate physical activity on most days. Ask your health care provider what type of exercise is best for you. How can I find out if my bone mass is low? Bone mass can be measured with an X-ray test called a bone mineral density (BMD) test. This test is recommended for all women who are age 65 or older. It may also be recommended for:  Men who are age 70 or older.  People who are at risk for osteoporosis because of: ? Having bones that break easily. ? Having a long-term disease that weakens bones, such as kidney disease or rheumatoid  arthritis. ? Having menopause earlier than normal. ? Taking medicine that weakens bones, such as steroids, thyroid hormones, or hormone treatment for breast cancer or prostate cancer. ? Smoking. ? Drinking three or more alcoholic drinks a day. If you find that you have a low bone mass, you may be able to prevent osteoporosis or further bone loss by changing your diet and lifestyle. Where can I find more information? For more information, check out the following websites:  National Osteoporosis Foundation: www.nof.org/patients  National Institutes of Health: www.bones.nih.gov  International Osteoporosis Foundation: www.iofbonehealth.org Summary  The aging process leads to an overall loss of bone mass in the body, which can increase the likelihood of broken bones and osteoporosis.  Eating a well-balanced diet with plenty of calcium and vitamin D will help to protect your bones.  Weight-bearing and strength-building activities are also important for building and maintaining strong bones.  Bone mass can be measured with an X-ray test called a bone mineral density (BMD) test. This information is not intended to replace advice given to you by your health care provider. Make sure you discuss any questions you have with your health care provider. Document Revised: 05/11/2017 Document Reviewed: 05/11/2017 Elsevier Patient Education  2020 Elsevier Inc.   

## 2020-04-24 LAB — HIV ANTIBODY (ROUTINE TESTING W REFLEX): HIV Screen 4th Generation wRfx: NONREACTIVE

## 2020-04-24 LAB — HEPATITIS PANEL, ACUTE
Hep A IgM: NEGATIVE
Hep B C IgM: NEGATIVE
Hep C Virus Ab: <0.1 s/co ratio (ref 0.0–0.9)
Hepatitis B Surface Ag: NEGATIVE

## 2020-04-24 LAB — RPR: RPR Ser Ql: NONREACTIVE

## 2020-04-26 LAB — CYTOLOGY - PAP
Chlamydia: NEGATIVE
Comment: NEGATIVE
Comment: NEGATIVE
Comment: NEGATIVE
Comment: NORMAL
Diagnosis: NEGATIVE
High risk HPV: NEGATIVE
Neisseria Gonorrhea: NEGATIVE
Trichomonas: NEGATIVE

## 2020-05-01 ENCOUNTER — Ambulatory Visit: Payer: BC Managed Care – PPO

## 2020-05-03 ENCOUNTER — Ambulatory Visit: Payer: BC Managed Care – PPO

## 2020-10-08 DIAGNOSIS — L858 Other specified epidermal thickening: Secondary | ICD-10-CM | POA: Diagnosis not present

## 2020-10-08 DIAGNOSIS — D485 Neoplasm of uncertain behavior of skin: Secondary | ICD-10-CM | POA: Diagnosis not present

## 2020-10-08 DIAGNOSIS — L2089 Other atopic dermatitis: Secondary | ICD-10-CM | POA: Diagnosis not present

## 2020-10-08 DIAGNOSIS — L918 Other hypertrophic disorders of the skin: Secondary | ICD-10-CM | POA: Diagnosis not present

## 2020-10-08 DIAGNOSIS — D225 Melanocytic nevi of trunk: Secondary | ICD-10-CM | POA: Diagnosis not present

## 2020-10-17 DIAGNOSIS — G4733 Obstructive sleep apnea (adult) (pediatric): Secondary | ICD-10-CM | POA: Diagnosis not present

## 2020-11-20 DIAGNOSIS — Z20828 Contact with and (suspected) exposure to other viral communicable diseases: Secondary | ICD-10-CM | POA: Diagnosis not present

## 2020-12-14 DIAGNOSIS — Z20828 Contact with and (suspected) exposure to other viral communicable diseases: Secondary | ICD-10-CM | POA: Diagnosis not present

## 2021-01-17 NOTE — Telephone Encounter (Signed)
This encounter was created in error - please disregard.

## 2021-02-07 DIAGNOSIS — E559 Vitamin D deficiency, unspecified: Secondary | ICD-10-CM | POA: Diagnosis not present

## 2021-02-07 DIAGNOSIS — F419 Anxiety disorder, unspecified: Secondary | ICD-10-CM | POA: Diagnosis not present

## 2021-02-07 DIAGNOSIS — E7800 Pure hypercholesterolemia, unspecified: Secondary | ICD-10-CM | POA: Diagnosis not present

## 2021-02-07 DIAGNOSIS — E785 Hyperlipidemia, unspecified: Secondary | ICD-10-CM | POA: Diagnosis not present

## 2021-02-07 DIAGNOSIS — I10 Essential (primary) hypertension: Secondary | ICD-10-CM | POA: Diagnosis not present

## 2021-02-07 DIAGNOSIS — E039 Hypothyroidism, unspecified: Secondary | ICD-10-CM | POA: Diagnosis not present

## 2021-02-07 DIAGNOSIS — Z79899 Other long term (current) drug therapy: Secondary | ICD-10-CM | POA: Diagnosis not present

## 2021-03-25 DIAGNOSIS — H5213 Myopia, bilateral: Secondary | ICD-10-CM | POA: Diagnosis not present

## 2021-03-25 DIAGNOSIS — H04123 Dry eye syndrome of bilateral lacrimal glands: Secondary | ICD-10-CM | POA: Diagnosis not present

## 2021-06-17 DIAGNOSIS — D225 Melanocytic nevi of trunk: Secondary | ICD-10-CM | POA: Diagnosis not present

## 2021-08-06 DIAGNOSIS — E039 Hypothyroidism, unspecified: Secondary | ICD-10-CM | POA: Diagnosis not present

## 2021-08-06 DIAGNOSIS — Z79899 Other long term (current) drug therapy: Secondary | ICD-10-CM | POA: Diagnosis not present

## 2021-08-06 DIAGNOSIS — F419 Anxiety disorder, unspecified: Secondary | ICD-10-CM | POA: Diagnosis not present

## 2021-08-06 DIAGNOSIS — I10 Essential (primary) hypertension: Secondary | ICD-10-CM | POA: Diagnosis not present

## 2021-08-07 DIAGNOSIS — E559 Vitamin D deficiency, unspecified: Secondary | ICD-10-CM | POA: Diagnosis not present

## 2021-08-07 DIAGNOSIS — E039 Hypothyroidism, unspecified: Secondary | ICD-10-CM | POA: Diagnosis not present

## 2021-08-07 DIAGNOSIS — E7800 Pure hypercholesterolemia, unspecified: Secondary | ICD-10-CM | POA: Diagnosis not present

## 2021-12-03 DIAGNOSIS — I10 Essential (primary) hypertension: Secondary | ICD-10-CM | POA: Diagnosis not present

## 2021-12-03 DIAGNOSIS — F419 Anxiety disorder, unspecified: Secondary | ICD-10-CM | POA: Diagnosis not present

## 2021-12-03 DIAGNOSIS — Z79899 Other long term (current) drug therapy: Secondary | ICD-10-CM | POA: Diagnosis not present

## 2021-12-03 DIAGNOSIS — E039 Hypothyroidism, unspecified: Secondary | ICD-10-CM | POA: Diagnosis not present

## 2021-12-03 DIAGNOSIS — K219 Gastro-esophageal reflux disease without esophagitis: Secondary | ICD-10-CM | POA: Diagnosis not present

## 2022-04-07 DIAGNOSIS — H5213 Myopia, bilateral: Secondary | ICD-10-CM | POA: Diagnosis not present

## 2022-04-07 DIAGNOSIS — H04123 Dry eye syndrome of bilateral lacrimal glands: Secondary | ICD-10-CM | POA: Diagnosis not present

## 2022-04-19 ENCOUNTER — Ambulatory Visit
Admission: EM | Admit: 2022-04-19 | Discharge: 2022-04-19 | Disposition: A | Discharge status: Home / Self Care | Payer: BC Managed Care – PPO

## 2022-04-19 ENCOUNTER — Ambulatory Visit (INDEPENDENT_AMBULATORY_CARE_PROVIDER_SITE_OTHER): Payer: BC Managed Care – PPO

## 2022-04-19 DIAGNOSIS — M5412 Radiculopathy, cervical region: Secondary | ICD-10-CM

## 2022-04-19 MED ORDER — TIZANIDINE HCL 4 MG PO TABS: 4.0000 mg | ORAL_TABLET | Freq: Every day | ORAL | 0 refills | Status: DC

## 2022-04-19 MED ORDER — PREDNISONE 20 MG PO TABS: ORAL_TABLET | ORAL | 0 refills | Status: DC

## 2022-04-19 MED ORDER — ONDANSETRON 8 MG PO TBDP: 8.0000 mg | ORAL_TABLET | Freq: Three times a day (TID) | ORAL | 0 refills | Status: AC | PRN

## 2022-04-19 MED ORDER — FAMOTIDINE 20 MG PO TABS: 20.0000 mg | ORAL_TABLET | Freq: Two times a day (BID) | ORAL | 0 refills | Status: DC

## 2022-04-19 NOTE — ED Triage Notes (Signed)
Pt reports yesterday she woke up with numbness and tingling in her left arm. It started in her chest and went down her arm. Her left finger is swollen Took 600 mg ibuprofen then 800 but no relief. But has no chest pain,  she does have back and shoulder pain

## 2022-04-19 NOTE — ED Provider Notes (Signed)
[facility]  Note:  This document was prepared using Systems analyst and may include unintentional dictation errors.  MRN: [id number] DOB: 10/09/1980  Subjective:   Jenny Hendrix is a 41 y.o. female presenting for 1 day history of acute onset persistent left arm tingling, numbness and weakness.  She has mild pain along the base of the neck but more prominent as the symptoms in her hand.  No fall, trauma, saddle paresthesia, changes to bowel or urinary habits, radicular symptoms.  No history of heart disease.  No drug use, cocaine use.  No history of stroke, cerebrovascular disease.  Patient is not a smoker.  No diabetes.  No current facility-administered medications for this encounter.  Current Outpatient Medications:    albuterol (VENTOLIN HFA) 108 (90 Base) MCG/ACT inhaler, Inhale 1-2 puffs into the lungs every 6 (six) hours as needed for wheezing or shortness of breath. (Patient not taking: Reported on 04/23/2020), Disp: , Rfl:    amLODipine (NORVASC) 10 MG tablet, Take 1 tablet (10 mg total) by mouth daily., Disp: 30 tablet, Rfl: 0   Ascorbic Acid (VITAMIN C) 1000 MG tablet, Take 1,000 mg by mouth daily., Disp: , Rfl:    clonazePAM (KLONOPIN) 1 MG tablet, Take 1 mg by mouth 3 (three) times daily as needed for anxiety. , Disp: , Rfl: 1   Cyanocobalamin (VITAMIN B 12 PO), Take by mouth., Disp: , Rfl:    D3-50 1.25 MG (50000 UT) capsule, Take 50,000 Units by mouth daily. (Patient not taking: Reported on 04/23/2020), Disp: , Rfl:    ipratropium-albuterol (DUONEB) 0.5-2.5 (3) MG/3ML SOLN, Use twice a day scheduled and every 4 hours as needed for shortness of breath and wheezing (Patient not taking: Reported on 04/23/2020), Disp: 360 mL, Rfl: 0   levonorgestrel (MIRENA) 20 MCG/24HR IUD, 1 Intra Uterine Device (1 each total) by Intrauterine route once for 1 dose., Disp: 1 each, Rfl: 0   levothyroxine (SYNTHROID) 75 MCG tablet, TAKE 1 TABLET BY MOUTH  EVERY DAY IN THE MORNING, Disp: , Rfl:    milk thistle 175 MG tablet, Take 175 mg by mouth daily., Disp: , Rfl:    omeprazole (PRILOSEC) 40 MG capsule, Take 40 mg by mouth at bedtime.  (Patient not taking: Reported on 04/23/2020), Disp: , Rfl:    sertraline (ZOLOFT) 100 MG tablet, Take 1 tablet (100 mg total) by mouth daily. (Patient taking differently: Take 50 mg by mouth daily with lunch.), Disp: 30 tablet, Rfl: 11   Allergies  Allergen Reactions   Azithromycin Nausea And Vomiting    Vomits if received via IV   Benzonatate Diarrhea   Metronidazole Other (See Comments)    Body aches, all-over side effects    Latex Rash    NO POWDERED GLOVES!!   Tape Rash    No Band-Aids!!    Past Medical History:  Diagnosis Date   Anemia    Anxiety    ASCUS of cervix with negative high risk HPV 05/22/2016   Depression    HSV infection    Hypothyroid    Hypothyroidism    Hypoxia 04/2018   Ovarian cyst    Pneumonia    Sleep apnea      Past Surgical History:  Procedure Laterality Date   CESAREAN SECTION N/A 09/06/2017   Procedure: CESAREAN SECTION;  Surgeon: Malachy Mood, MD;  Location: [facility] ORS;  Service: Obstetrics;  Laterality: N/A;   DILATION AND EVACUATION N/A 06/19/2016   Procedure: DILATATION AND  EVACUATION;  Surgeon: Gae Dry, MD;  Location: [facility] ORS;  Service: Gynecology;  Laterality: N/A;   TONSILLECTOMY AND ADENOIDECTOMY     WISDOM TOOTH EXTRACTION      Family History  Problem Relation Age of Onset   Diabetes Mother    Depression Mother    Hyperlipidemia Mother    Hypertension Mother    Hyperlipidemia Maternal Grandmother    Hypertension Maternal Grandmother    Breast cancer Maternal Grandmother    Thyroid disease Maternal Grandmother    Colon cancer Maternal Grandmother 73   Other Father        suicide    Social History   Tobacco Use   Smoking status: Never   Smokeless tobacco: Never  Vaping Use   Vaping Use: Never used  Substance Use Topics    Alcohol use: Yes    Comment: "ocassionally"   Drug use: No    ROS   Objective:   Vitals: BP (!) 161/92   Pulse (!) 56   Temp 98.4 F (36.9 C) (Oral)   Resp 20   LMP 04/03/2022   SpO2 97%   Physical Exam Constitutional:      General: She is not in acute distress.    Appearance: Normal appearance. She is well-developed. She is not ill-appearing, toxic-appearing or diaphoretic.  HENT:     Head: Normocephalic and atraumatic.     Right Ear: External ear normal.     Left Ear: External ear normal.     Nose: Nose normal.     Mouth/Throat:     Mouth: Mucous membranes are moist.  Eyes:     General: No scleral icterus.       Right eye: No discharge.        Left eye: No discharge.     Extraocular Movements: Extraocular movements intact.  Cardiovascular:     Rate and Rhythm: Normal rate and regular rhythm.     Heart sounds: Normal heart sounds. No murmur heard.    No friction rub. No gallop.  Pulmonary:     Effort: Pulmonary effort is normal. No respiratory distress.     Breath sounds: No stridor. No wheezing, rhonchi or rales.  Chest:     Chest wall: No tenderness.  Musculoskeletal:     Cervical back: No swelling, edema, deformity, erythema, signs of trauma, lacerations, rigidity, spasms, torticollis, tenderness, bony tenderness or crepitus. No pain with movement. Normal range of motion.     Comments: Positive Spurling maneuver to the left.  Negative Lhermitte sign.  Skin:    General: Skin is warm and dry.  Neurological:     General: No focal deficit present.     Mental Status: She is alert and oriented to person, place, and time.     Cranial Nerves: No cranial nerve deficit.     Motor: No weakness.     Coordination: Coordination normal.     Gait: Gait normal.     Comments: Negative Romberg and pronator drift.    Psychiatric:        Mood and Affect: Mood normal.        Behavior: Behavior normal.        Thought Content: Thought content normal.        Judgment: Judgment  normal.     Assessment and Plan :   PDMP not reviewed this encounter.  1. Cervical radiculopathy     High suspicion for cervical radiculopathy.  Will pursue x-ray, patient will be notified through [place]  or by phone call of results.  Emphasized need to start high-dose steroid given the severity of her symptoms and also to suspicion for cervical radiculopathy.  As such she is to follow-up with [facility]. Counseled patient on potential for adverse effects with medications prescribed/recommended today, ER and return-to-clinic precautions discussed, patient verbalized understanding.    Jaynee Eagles, [place] 04/20/22 [phone]

## 2022-05-08 DIAGNOSIS — M5412 Radiculopathy, cervical region: Secondary | ICD-10-CM | POA: Diagnosis not present

## 2022-05-13 ENCOUNTER — Other Ambulatory Visit: Payer: Self-pay | Admitting: Neurosurgery

## 2022-05-13 DIAGNOSIS — M5412 Radiculopathy, cervical region: Secondary | ICD-10-CM

## 2022-06-01 ENCOUNTER — Other Ambulatory Visit: Payer: BC Managed Care – PPO

## 2022-06-06 ENCOUNTER — Ambulatory Visit
Admission: RE | Admit: 2022-06-06 | Discharge: 2022-06-06 | Disposition: A | Discharge status: Home / Self Care | Payer: BC Managed Care – PPO | Source: Ambulatory Visit | Attending: Neurosurgery | Admitting: Neurosurgery

## 2022-06-06 DIAGNOSIS — M4802 Spinal stenosis, cervical region: Secondary | ICD-10-CM | POA: Diagnosis not present

## 2022-06-06 DIAGNOSIS — M5412 Radiculopathy, cervical region: Secondary | ICD-10-CM

## 2022-07-01 DIAGNOSIS — E785 Hyperlipidemia, unspecified: Secondary | ICD-10-CM | POA: Diagnosis not present

## 2022-07-01 DIAGNOSIS — F419 Anxiety disorder, unspecified: Secondary | ICD-10-CM | POA: Diagnosis not present

## 2022-07-01 DIAGNOSIS — I10 Essential (primary) hypertension: Secondary | ICD-10-CM | POA: Diagnosis not present

## 2022-07-01 DIAGNOSIS — E039 Hypothyroidism, unspecified: Secondary | ICD-10-CM | POA: Diagnosis not present

## 2022-07-03 DIAGNOSIS — M5412 Radiculopathy, cervical region: Secondary | ICD-10-CM | POA: Diagnosis not present

## 2022-07-22 DIAGNOSIS — F419 Anxiety disorder, unspecified: Secondary | ICD-10-CM | POA: Diagnosis not present

## 2022-07-22 DIAGNOSIS — I10 Essential (primary) hypertension: Secondary | ICD-10-CM | POA: Diagnosis not present

## 2022-09-26 DIAGNOSIS — E785 Hyperlipidemia, unspecified: Secondary | ICD-10-CM | POA: Diagnosis not present

## 2022-09-26 DIAGNOSIS — K219 Gastro-esophageal reflux disease without esophagitis: Secondary | ICD-10-CM | POA: Diagnosis not present

## 2022-09-26 DIAGNOSIS — I10 Essential (primary) hypertension: Secondary | ICD-10-CM | POA: Diagnosis not present

## 2022-09-26 DIAGNOSIS — E039 Hypothyroidism, unspecified: Secondary | ICD-10-CM | POA: Diagnosis not present

## 2022-10-01 DIAGNOSIS — R82998 Other abnormal findings in urine: Secondary | ICD-10-CM | POA: Diagnosis not present

## 2022-10-01 DIAGNOSIS — Z0000 Encounter for general adult medical examination without abnormal findings: Secondary | ICD-10-CM | POA: Diagnosis not present

## 2022-10-01 DIAGNOSIS — I10 Essential (primary) hypertension: Secondary | ICD-10-CM | POA: Diagnosis not present

## 2022-10-01 DIAGNOSIS — Z1331 Encounter for screening for depression: Secondary | ICD-10-CM | POA: Diagnosis not present

## 2022-10-01 DIAGNOSIS — Z1339 Encounter for screening examination for other mental health and behavioral disorders: Secondary | ICD-10-CM | POA: Diagnosis not present

## 2023-01-27 DIAGNOSIS — I10 Essential (primary) hypertension: Secondary | ICD-10-CM | POA: Diagnosis not present

## 2023-04-13 DIAGNOSIS — H04123 Dry eye syndrome of bilateral lacrimal glands: Secondary | ICD-10-CM | POA: Diagnosis not present

## 2023-04-13 DIAGNOSIS — H5213 Myopia, bilateral: Secondary | ICD-10-CM | POA: Diagnosis not present

## 2023-05-06 DIAGNOSIS — I10 Essential (primary) hypertension: Secondary | ICD-10-CM | POA: Diagnosis not present

## 2023-07-23 NOTE — Patient Instructions (Signed)
 Preventive Care 86-41 Years Old, Female Preventive care refers to lifestyle choices and visits with your health care provider that can promote health and wellness. Preventive care visits are also called wellness exams. What can I expect for my preventive care visit? Counseling Your health care provider may ask you questions about your: Medical history, including: Past medical problems. Family medical history. Pregnancy history. Current health, including: Menstrual cycle. Method of birth control. Emotional well-being. Home life and relationship well-being. Sexual activity and sexual health. Lifestyle, including: Alcohol, nicotine or tobacco, and drug use. Access to firearms. Diet, exercise, and sleep habits. Work and work Astronomer. Sunscreen use. Safety issues such as seatbelt and bike helmet use. Physical exam Your health care provider will check your: Height and weight. These may be used to calculate your BMI (body mass index). BMI is a measurement that tells if you are at a healthy weight. Waist circumference. This measures the distance around your waistline. This measurement also tells if you are at a healthy weight and may help predict your risk of certain diseases, such as type 2 diabetes and high blood pressure. Heart rate and blood pressure. Body temperature. Skin for abnormal spots. What immunizations do I need?  Vaccines are usually given at various ages, according to a schedule. Your health care provider will recommend vaccines for you based on your age, medical history, and lifestyle or other factors, such as travel or where you work. What tests do I need? Screening Your health care provider may recommend screening tests for certain conditions. This may include: Lipid and cholesterol levels. Diabetes screening. This is done by checking your blood sugar (glucose) after you have not eaten for a while (fasting). Pelvic exam and Pap test. Hepatitis B test. Hepatitis C  test. HIV (human immunodeficiency virus) test. STI (sexually transmitted infection) testing, if you are at risk. Lung cancer screening. Colorectal cancer screening. Mammogram. Talk with your health care provider about when you should start having regular mammograms. This may depend on whether you have a family history of breast cancer. BRCA-related cancer screening. This may be done if you have a family history of breast, ovarian, tubal, or peritoneal cancers. Bone density scan. This is done to screen for osteoporosis. Talk with your health care provider about your test results, treatment options, and if necessary, the need for more tests. Follow these instructions at home: Eating and drinking  Eat a diet that includes fresh fruits and vegetables, whole grains, lean protein, and low-fat dairy products. Take vitamin and mineral supplements as recommended by your health care provider. Do not drink alcohol if: Your health care provider tells you not to drink. You are pregnant, may be pregnant, or are planning to become pregnant. If you drink alcohol: Limit how much you have to 0-1 drink a day. Know how much alcohol is in your drink. In the [place] one drink equals one 12 oz bottle of beer (355 mL), one 5 oz glass of wine (148 mL), or one 1 oz glass of hard liquor (44 mL). Lifestyle Brush your teeth every morning and night with fluoride toothpaste. Floss one time each day. Exercise for at least 30 minutes 5 or more days each week. Do not use any products that contain nicotine or tobacco. These products include cigarettes, chewing tobacco, and vaping devices, such as e-cigarettes. If you need help quitting, ask your health care provider. Do not use drugs. If you are sexually active, practice safe sex. Use a condom or other form of protection to  prevent STIs. If you do not wish to become pregnant, use a form of birth control. If you plan to become pregnant, see your health care provider for a  prepregnancy visit. Take aspirin only as told by your health care provider. Make sure that you understand how much to take and what form to take. Work with your health care provider to find out whether it is safe and beneficial for you to take aspirin daily. Find healthy ways to manage stress, such as: Meditation, yoga, or listening to music. Journaling. Talking to a trusted person. Spending time with friends and family. Minimize exposure to UV radiation to reduce your risk of skin cancer. Safety Always wear your seat belt while driving or riding in a vehicle. Do not drive: If you have been drinking alcohol. Do not ride with someone who has been drinking. When you are tired or distracted. While texting. If you have been using any mind-altering substances or drugs. Wear a helmet and other protective equipment during sports activities. If you have firearms in your house, make sure you follow all gun safety procedures. Seek help if you have been physically or sexually abused. What's next? Visit your health care provider once a year for an annual wellness visit. Ask your health care provider how often you should have your eyes and teeth checked. Stay up to date on all vaccines. This information is not intended to replace advice given to you by your health care provider. Make sure you discuss any questions you have with your health care provider. Document Revised: 10/10/2020 Document Reviewed: 10/10/2020  Breast Self-Awareness Breast self-awareness is knowing how your breasts look and feel. You need to: Check your breasts on a regular basis. Tell your doctor about any changes. Become familiar with the look and feel of your breasts. This can help you catch a breast problem while it is still small and can be treated. You should do breast self-exams even if you have breast implants. What you need: A mirror. A well-lit room. A pillow or other soft object. How to do a breast self-exam Follow  these steps to do a breast self-exam: Look for changes  Take off all the clothes above your waist. Stand in front of a mirror in a room with good lighting. Put your hands down at your sides. Compare your breasts in the mirror. Look for any difference between them, such as: A difference in shape. A difference in size. Wrinkles, dips, and bumps in one breast and not the other. Look at each breast for changes in the skin, such as: Redness. Scaly areas. Skin that has gotten thicker. Dimpling. Open sores (ulcers). Look for changes in your nipples, such as: Fluid coming out of a nipple. Fluid around a nipple. Bleeding. Dimpling. Redness. A nipple that looks pushed in (retracted), or that has changed position. Feel for changes Lie on your back. Feel each breast. To do this: Pick a breast to feel. Place a pillow under the shoulder closest to that breast. Put the arm closest to that breast behind your head. Feel the nipple area of that breast using the hand of your other arm. Feel the area with the pads of your three middle fingers by making small circles with your fingers. Use light, medium, and firm pressure. Continue the overlapping circles, moving downward over the breast. Keep making circles with your fingers. Stop when you feel your ribs. Start making circles with your fingers again, this time going upward until you reach your collarbone.  Then, make circles outward across your breast and into your armpit area. Squeeze your nipple. Check for discharge and lumps. Repeat these steps to check your other breast. Sit or stand in the tub or shower. With soapy water on your skin, feel each breast the same way you did when you were lying down. Write down what you find Writing down what you find can help you remember what to tell your doctor. Write down: What is normal for each breast. Any changes you find in each breast. These include: The kind of changes you find. A tender or painful  breast. Any lump you find. Write down its size and where it is. When you last had your monthly period (menstrual cycle). General tips If you are breastfeeding, the best time to check your breasts is after you feed your baby or after you use a breast pump. If you get monthly bleeding, the best time to check your breasts is 5-7 days after your monthly cycle ends. With time, you will become comfortable with the self-exam. You will also start to know if there are changes in your breasts. Contact a doctor if: You see a change in the shape or size of your breasts or nipples. You see a change in the skin of your breast or nipples, such as red or scaly skin. You have fluid coming from your nipples that is not normal. You find a new lump or thick area. You have breast pain. You have any concerns about your breast health. Summary Breast self-awareness includes looking for changes in your breasts and feeling for changes within your breasts. You should do breast self-awareness in front of a mirror in a well-lit room. If you get monthly periods (menstrual cycles), the best time to check your breasts is 5-7 days after your period ends. Tell your doctor about any changes you see in your breasts. Changes include changes in size, changes on the skin, painful or tender breasts, or fluid from your nipples that is not normal. This information is not intended to replace advice given to you by your health care provider. Make sure you discuss any questions you have with your health care provider. Document Revised: 09/19/2021 Document Reviewed: 02/14/2021 Elsevier Patient Education  2024 [employer]. Elsevier Patient Education  2024 [employer].

## 2023-07-24 ENCOUNTER — Ambulatory Visit: Payer: Self-pay | Admitting: Certified Nurse Midwife

## 2023-07-24 ENCOUNTER — Other Ambulatory Visit (HOSPITAL_COMMUNITY)
Admission: RE | Admit: 2023-07-24 | Discharge: 2023-07-24 | Disposition: A | Discharge status: Home / Self Care | Source: Ambulatory Visit | Attending: Certified Nurse Midwife | Admitting: Certified Nurse Midwife

## 2023-07-24 VITALS — BP 128/82 | HR 77 | Resp 16 | Ht 61.5 in | Wt 233.5 lb

## 2023-07-24 DIAGNOSIS — Z124 Encounter for screening for malignant neoplasm of cervix: Secondary | ICD-10-CM

## 2023-07-24 DIAGNOSIS — D649 Anemia, unspecified: Secondary | ICD-10-CM

## 2023-07-24 DIAGNOSIS — Z01419 Encounter for gynecological examination (general) (routine) without abnormal findings: Secondary | ICD-10-CM | POA: Diagnosis not present

## 2023-07-24 DIAGNOSIS — Z803 Family history of malignant neoplasm of breast: Secondary | ICD-10-CM | POA: Insufficient documentation

## 2023-07-24 DIAGNOSIS — Z538 Procedure and treatment not carried out for other reasons: Secondary | ICD-10-CM | POA: Insufficient documentation

## 2023-07-24 DIAGNOSIS — Z1231 Encounter for screening mammogram for malignant neoplasm of breast: Secondary | ICD-10-CM

## 2023-07-24 DIAGNOSIS — Z13220 Encounter for screening for lipoid disorders: Secondary | ICD-10-CM

## 2023-07-24 DIAGNOSIS — Z131 Encounter for screening for diabetes mellitus: Secondary | ICD-10-CM

## 2023-07-24 DIAGNOSIS — E039 Hypothyroidism, unspecified: Secondary | ICD-10-CM

## 2023-07-24 NOTE — Assessment & Plan Note (Addendum)
 Reviewed IUD can last for two more years. Desires IUD replacement. Unable to remove IUD. Unable to view strings or feel IUD with string finder or hemostat clamp. Will return for MD visit and possible US guided removal.

## 2023-07-24 NOTE — Assessment & Plan Note (Addendum)
 BRCA screening done today Mammogram ordered today

## 2023-07-24 NOTE — Assessment & Plan Note (Signed)
-   Reviewed health maintenance topics as documented below. - Most recent pap smear in 2021, collected today - Mammogram UTD, ordered screening today.  - STI screening declined.

## 2023-07-24 NOTE — Progress Notes (Signed)
 Outpatient Gynecology Note: Annual Visit  Assessment/Plan:    Jenny Hendrix is a 43 y.o. female G71P1011 with normal well-woman gynecologic exam.   Family history of breast cancer BRCA screening done today Mammogram ordered today  Attempted IUD removal, unsuccessful Reviewed IUD can last for two more years. Desires IUD replacement. Unable to remove IUD. Unable to view strings or feel IUD with string finder or hemostat clamp. Will return for MD visit and possible US guided removal.  Well woman exam with routine gynecological exam - Reviewed health maintenance topics as documented below. - Most recent pap smear in 2021, collected today - Mammogram UTD, ordered screening today.  - STI screening declined.      Risk factors identified in Subjective to review:  Orders Placed This Encounter  Procedures   MM 3D SCREENING MAMMOGRAM BILATERAL BREAST    Reason for Exam (SYMPTOM  OR DIAGNOSIS REQUIRED):   screening    Is the patient pregnant?:   No    Preferred imaging location?:   [facility]   BRCAssure Comprehensive Test   Current Outpatient Medications  Medication Instructions   albuterol (VENTOLIN HFA) 108 (90 Base) MCG/ACT inhaler 1-2 puffs, Every 6 hours PRN   amLODipine (NORVASC) 10 mg, Oral, Daily   clonazePAM (KLONOPIN) 1 mg, 3 times daily PRN   Cyanocobalamin (VITAMIN B 12 PO) Take by mouth.   D3-50 50,000 Units, Daily   escitalopram (LEXAPRO) 10 mg, Daily   hydrochlorothiazide (HYDRODIURIL) 25 mg, Daily   levonorgestrel (MIRENA) 20 MCG/24HR IUD 1 each, Intrauterine,  Once   levothyroxine (SYNTHROID) 75 MCG tablet TAKE 1 TABLET BY MOUTH EVERY DAY IN THE MORNING   losartan (COZAAR) 50 mg, Daily   milk thistle 175 mg, Daily   omeprazole (PRILOSEC) 40 mg, Daily at bedtime   ondansetron (ZOFRAN-ODT) 8 mg, Oral, Every 8 hours PRN   phentermine 30 mg, Daily   sertraline (ZOLOFT) 100 mg, Oral, Daily   vitamin C 1,000 mg, Daily    No follow-ups on file.     Subjective:    Jenny Hendrix is a 43 y.o. female G2P1011 who presents for annual wellness visit.     CONCERNS? Desires IUD replacement at 6 years rather than 8.   Well Woman Visit:  GYN HISTORY:  No LMP recorded (lmp unknown). (Menstrual status: IUD).     Menstrual History: OB History     Gravida  2   Para  1   Term  1   Preterm      AB  1   Living  1      SAB  1   IAB      Ectopic      Multiple  0   Live Births  1            No LMP recorded (lmp unknown). (Menstrual status: IUD).     Intermenstrual bleeding, spotting, or discharge? No   Last pap:was normal  History of abnormal Pap: No STI history: no STI/HIV testing or immunizations needed? No.  Contraceptive methods: IUD  Health Maintenance > Reviewed breast self-awareness > History of abnormal mammogram: No  Tobacco Use: Low Risk  (04/19/2022)   Patient History    Smoking Tobacco Use: Never    Smokeless Tobacco Use: Never    Passive Exposure: Not on file     _________________________________________________________  Current Outpatient Medications  Medication Sig Dispense Refill   albuterol (VENTOLIN HFA) 108 (90 Base) MCG/ACT inhaler Inhale 1-2 puffs  into the lungs every 6 (six) hours as needed for wheezing or shortness of breath.     amLODipine (NORVASC) 10 MG tablet Take 1 tablet (10 mg total) by mouth daily. 30 tablet 0   Ascorbic Acid (VITAMIN C) 1000 MG tablet Take 1,000 mg by mouth daily.     clonazePAM (KLONOPIN) 1 MG tablet Take 1 mg by mouth 3 (three) times daily as needed for anxiety.   1   Cyanocobalamin (VITAMIN B 12 PO) Take by mouth.     D3-50 1.25 MG (50000 UT) capsule Take 50,000 Units by mouth daily.     escitalopram (LEXAPRO) 10 MG tablet Take 10 mg by mouth daily.     hydrochlorothiazide (HYDRODIURIL) 25 MG tablet 25 mg daily.     levothyroxine (SYNTHROID) 75 MCG tablet TAKE 1 TABLET BY MOUTH EVERY DAY IN THE MORNING     losartan (COZAAR) 50 MG tablet Take 50  mg by mouth daily.     milk thistle 175 MG tablet Take 175 mg by mouth daily.     omeprazole (PRILOSEC) 40 MG capsule Take 40 mg by mouth at bedtime.     ondansetron (ZOFRAN-ODT) 8 MG disintegrating tablet Take 1 tablet (8 mg total) by mouth every 8 (eight) hours as needed for nausea or vomiting. 20 tablet 0   phentermine 30 MG capsule Take 30 mg by mouth daily.     sertraline (ZOLOFT) 100 MG tablet Take 1 tablet (100 mg total) by mouth daily. (Patient taking differently: Take 50 mg by mouth daily with lunch.) 30 tablet 11   levonorgestrel (MIRENA) 20 MCG/24HR IUD 1 Intra Uterine Device (1 each total) by Intrauterine route once for 1 dose. 1 each 0   No current facility-administered medications for this visit.   Allergies  Allergen Reactions   Azithromycin Nausea And Vomiting    Vomits if received via IV   Benzonatate Diarrhea   Metronidazole Other (See Comments)    Body aches, all-over side effects    Latex Rash    NO POWDERED GLOVES!!   Tape Rash    No Band-Aids!!    Past Medical History:  Diagnosis Date   Anemia    Anxiety    ASCUS of cervix with negative high risk HPV 05/22/2016   Depression    HSV infection    Hypothyroid    Hypothyroidism    Hypoxia 04/2018   Ovarian cyst    Pneumonia    Sleep apnea    Past Surgical History:  Procedure Laterality Date   CESAREAN SECTION N/A 09/06/2017   Procedure: CESAREAN SECTION;  Surgeon: Vena Austria, MD;  Location: [facility] ORS;  Service: Obstetrics;  Laterality: N/A;   DILATION AND EVACUATION N/A 06/19/2016   Procedure: DILATATION AND EVACUATION;  Surgeon: Nadara Mustard, MD;  Location: [facility] OR[facility];  Service: Gynecology;  Laterality: N/A;   TONSILLECTOMY AND ADENOIDECTOMY     WISDOM TOOTH EXTRACTION     OB History     Gravida  2   Para  1   Term  1   Preterm      AB  1   Living  1      SAB  1   IAB      Ectopic      Multiple  0   Live Births  1          Social History   Tobacco Use   Smoking  status: Never   Smokeless tobacco: Never  Substance Use Topics  Alcohol use: Yes    Comment: "ocassionally"   Social History   Substance and Sexual Activity  Sexual Activity Yes   Birth control/protection: I.U.D.   Comment: Mirena    Immunization History  Administered Date(s) Administered   Influenza-Unspecified 01/16/2014, 02/10/2020   Moderna Sars-Covid-2 Vaccination 04/14/2020   PFIZER(Purple Top)SARS-COV-2 Vaccination 09/14/2019, 10/04/2019   Tdap 06/24/2017     Review Of Systems  Constitutional: Denied constitutional symptoms, night sweats, recent illness, fatigue, fever, insomnia and weight loss.  Eyes: Denied eye symptoms, eye pain, photophobia, vision change and visual disturbance.  Ears/Nose/Throat/Neck: Denied ear, nose, throat or neck symptoms, hearing loss, nasal discharge, sinus congestion and sore throat.  Cardiovascular: Denied cardiovascular symptoms, arrhythmia, chest pain/pressure, edema, exercise intolerance, orthopnea and palpitations.  Respiratory: Denied pulmonary symptoms, asthma, pleuritic pain, productive sputum, cough, dyspnea and wheezing.  Gastrointestinal: Denied, gastro-esophageal reflux, melena, nausea and vomiting.  Genitourinary: Denied genitourinary symptoms including symptomatic vaginal discharge, pelvic relaxation issues, and urinary complaints.  Musculoskeletal: Denied musculoskeletal symptoms, stiffness, swelling, muscle weakness and myalgia.  Dermatologic: Denied dermatology symptoms, rash and scar.  Neurologic: Denied neurology symptoms, dizziness, headache, neck pain and syncope.  Psychiatric: Denied psychiatric symptoms, anxiety and depression.  Endocrine: Denied endocrine symptoms including hot flashes and night sweats.      Objective:    BP 128/82   Pulse 77   Resp 16   Ht 5' 1.5" (1.562 m)   Wt 233 lb 8 oz (105.9 kg)   LMP  (LMP Unknown)   BMI 43.40 kg/m   Constitutional: Well-developed, well-nourished female in no acute  distress Neurological: Alert and oriented to person, place, and time Psychiatric: Mood and affect appropriate Skin: No rashes or lesions Neck: Supple without masses. Trachea is midline.Thyroid is normal size without masses Lymphatics: No cervical, axillary, supraclavicular, or inguinal adenopathy noted Respiratory: Clear to auscultation bilaterally. Good air movement with normal work of breathing. Cardiovascular: Regular rate and rhythm. Extremities grossly normal, nontender with no edema; pulses regular Gastrointestinal: Soft, nontender, nondistended. No masses or hernias appreciated. No hepatosplenomegaly. No fluid wave. No rebound or guarding. Breast Exam: normal appearance, no masses or tenderness Genitourinary:         External Genitalia: Normal female genitalia    Vagina: no lesions.    Cervix: No lesions, normal size and consistency; no cervical motion tenderness     Uterus: Normal size and contour; smooth, mobile, NT, midposition. Adnexae: Non-palpable and non-tender Perineum/Anus: No lesions Rectal: deferred    Autumn Messing, CNM  07/24/23 11:44 AM

## 2023-07-30 LAB — CYTOLOGY - PAP
Comment: NEGATIVE
Diagnosis: UNDETERMINED — AB
High risk HPV: NEGATIVE

## 2023-08-19 LAB — BRCASSURE COMPREHENSIVE PANEL

## 2023-09-02 NOTE — Telephone Encounter (Signed)
 Received fax from [facility] regarding pt's test. I asked about a PA and was advised no PA needed, out of pocket is $3,000. Pt has a high deductible with her insurance.

## 2023-10-07 DIAGNOSIS — E039 Hypothyroidism, unspecified: Secondary | ICD-10-CM | POA: Diagnosis not present

## 2023-10-07 DIAGNOSIS — E785 Hyperlipidemia, unspecified: Secondary | ICD-10-CM | POA: Diagnosis not present

## 2023-10-14 DIAGNOSIS — Z1331 Encounter for screening for depression: Secondary | ICD-10-CM | POA: Diagnosis not present

## 2023-10-14 DIAGNOSIS — Z1339 Encounter for screening examination for other mental health and behavioral disorders: Secondary | ICD-10-CM | POA: Diagnosis not present

## 2023-10-14 DIAGNOSIS — Z0000 Encounter for general adult medical examination without abnormal findings: Secondary | ICD-10-CM | POA: Diagnosis not present

## 2023-10-14 DIAGNOSIS — R82998 Other abnormal findings in urine: Secondary | ICD-10-CM | POA: Diagnosis not present

## 2023-10-14 DIAGNOSIS — I10 Essential (primary) hypertension: Secondary | ICD-10-CM | POA: Diagnosis not present
